# Patient Record
Sex: Male | Born: 1945
Health system: Southern US, Community
[De-identification: ages and names within clinical notes are randomized; demographics above are authoritative.]

## PROBLEM LIST (undated history)

## (undated) DIAGNOSIS — I10 Essential (primary) hypertension: Secondary | ICD-10-CM

## (undated) DIAGNOSIS — Z87442 Personal history of urinary calculi: Secondary | ICD-10-CM

## (undated) DIAGNOSIS — Z860101 Personal history of adenomatous and serrated colon polyps: Secondary | ICD-10-CM

## (undated) DIAGNOSIS — Z87438 Personal history of other diseases of male genital organs: Secondary | ICD-10-CM

## (undated) DIAGNOSIS — G4700 Insomnia, unspecified: Secondary | ICD-10-CM

## (undated) DIAGNOSIS — Z973 Presence of spectacles and contact lenses: Secondary | ICD-10-CM

## (undated) DIAGNOSIS — Z972 Presence of dental prosthetic device (complete) (partial): Secondary | ICD-10-CM

## (undated) DIAGNOSIS — K91858 Other complications of intestinal pouch: Secondary | ICD-10-CM

## (undated) DIAGNOSIS — R399 Unspecified symptoms and signs involving the genitourinary system: Secondary | ICD-10-CM

## (undated) DIAGNOSIS — I82509 Chronic embolism and thrombosis of unspecified deep veins of unspecified lower extremity: Secondary | ICD-10-CM

## (undated) DIAGNOSIS — N471 Phimosis: Secondary | ICD-10-CM

## (undated) DIAGNOSIS — Z86010 Personal history of colonic polyps: Secondary | ICD-10-CM

## (undated) DIAGNOSIS — N182 Chronic kidney disease, stage 2 (mild): Secondary | ICD-10-CM

## (undated) DIAGNOSIS — K449 Diaphragmatic hernia without obstruction or gangrene: Secondary | ICD-10-CM

## (undated) DIAGNOSIS — E119 Type 2 diabetes mellitus without complications: Secondary | ICD-10-CM

## (undated) DIAGNOSIS — M1990 Unspecified osteoarthritis, unspecified site: Secondary | ICD-10-CM

## (undated) DIAGNOSIS — L719 Rosacea, unspecified: Secondary | ICD-10-CM

## (undated) DIAGNOSIS — I2510 Atherosclerotic heart disease of native coronary artery without angina pectoris: Secondary | ICD-10-CM

## (undated) DIAGNOSIS — Z8719 Personal history of other diseases of the digestive system: Secondary | ICD-10-CM

## (undated) DIAGNOSIS — Z8619 Personal history of other infectious and parasitic diseases: Secondary | ICD-10-CM

## (undated) DIAGNOSIS — K219 Gastro-esophageal reflux disease without esophagitis: Secondary | ICD-10-CM

## (undated) DIAGNOSIS — M5030 Other cervical disc degeneration, unspecified cervical region: Secondary | ICD-10-CM

## (undated) DIAGNOSIS — E785 Hyperlipidemia, unspecified: Secondary | ICD-10-CM

## (undated) DIAGNOSIS — E538 Deficiency of other specified B group vitamins: Secondary | ICD-10-CM

## (undated) HISTORY — DX: Insomnia, unspecified: G47.00

## (undated) HISTORY — PX: CARDIAC CATHETERIZATION: SHX172

## (undated) HISTORY — PX: NM MYOCAR PERF WALL MOTION: HXRAD629

## (undated) HISTORY — DX: Atherosclerotic heart disease of native coronary artery without angina pectoris: I25.10

## (undated) HISTORY — PX: KNEE ARTHROSCOPY: SUR90

## (undated) HISTORY — DX: Rosacea, unspecified: L71.9

## (undated) HISTORY — DX: Hyperlipidemia, unspecified: E78.5

## (undated) HISTORY — PX: OTHER SURGICAL HISTORY: SHX169

## (undated) HISTORY — DX: Diaphragmatic hernia without obstruction or gangrene: K44.9

## (undated) HISTORY — DX: Essential (primary) hypertension: I10

---

## 1998-11-15 ENCOUNTER — Encounter: Payer: Self-pay | Admitting: Cardiovascular Disease

## 1998-11-15 ENCOUNTER — Ambulatory Visit (HOSPITAL_COMMUNITY): Admission: RE | Admit: 1998-11-15 | Discharge: 1998-11-15 | Payer: Self-pay | Admitting: Cardiovascular Disease

## 2007-11-16 HISTORY — PX: TRANSTHORACIC ECHOCARDIOGRAM: SHX275

## 2011-07-22 ENCOUNTER — Ambulatory Visit: Payer: Self-pay | Admitting: Rheumatology

## 2011-07-30 ENCOUNTER — Ambulatory Visit: Payer: Self-pay | Admitting: Unknown Physician Specialty

## 2011-08-03 ENCOUNTER — Ambulatory Visit: Payer: Self-pay | Admitting: Unknown Physician Specialty

## 2012-02-19 ENCOUNTER — Encounter: Payer: Self-pay | Admitting: Family Medicine

## 2012-02-19 ENCOUNTER — Ambulatory Visit (INDEPENDENT_AMBULATORY_CARE_PROVIDER_SITE_OTHER): Payer: Medicare Other | Admitting: Family Medicine

## 2012-02-19 DIAGNOSIS — K449 Diaphragmatic hernia without obstruction or gangrene: Secondary | ICD-10-CM

## 2012-02-19 DIAGNOSIS — Z9049 Acquired absence of other specified parts of digestive tract: Secondary | ICD-10-CM

## 2012-02-19 DIAGNOSIS — N200 Calculus of kidney: Secondary | ICD-10-CM

## 2012-02-19 DIAGNOSIS — Z9889 Other specified postprocedural states: Secondary | ICD-10-CM

## 2012-02-19 DIAGNOSIS — E1149 Type 2 diabetes mellitus with other diabetic neurological complication: Secondary | ICD-10-CM

## 2012-02-19 DIAGNOSIS — K5190 Ulcerative colitis, unspecified, without complications: Secondary | ICD-10-CM

## 2012-02-19 DIAGNOSIS — IMO0002 Reserved for concepts with insufficient information to code with codable children: Secondary | ICD-10-CM

## 2012-02-19 DIAGNOSIS — E1142 Type 2 diabetes mellitus with diabetic polyneuropathy: Secondary | ICD-10-CM

## 2012-02-19 DIAGNOSIS — I2510 Atherosclerotic heart disease of native coronary artery without angina pectoris: Secondary | ICD-10-CM

## 2012-02-19 DIAGNOSIS — I10 Essential (primary) hypertension: Secondary | ICD-10-CM

## 2012-02-19 DIAGNOSIS — G4700 Insomnia, unspecified: Secondary | ICD-10-CM

## 2012-02-19 DIAGNOSIS — M1990 Unspecified osteoarthritis, unspecified site: Secondary | ICD-10-CM

## 2012-02-19 DIAGNOSIS — E7800 Pure hypercholesterolemia, unspecified: Secondary | ICD-10-CM

## 2012-02-19 DIAGNOSIS — L719 Rosacea, unspecified: Secondary | ICD-10-CM

## 2012-02-19 NOTE — Patient Instructions (Signed)
Let me look back at your old records and I'll set up labs.  Take care.

## 2012-02-19 NOTE — Progress Notes (Signed)
New patient  Known CAD on medical mgmt per [facility].    Diabetes:  Using medications without difficulties:yes Hypoglycemic episodes:no Hyperglycemic episodes:no Feet problems: see below, h/o tingling and dec in sensation on toes.  Blood Sugars averaging: ~100  Hypertension:    Using medication without problems or lightheadedness: yes Chest pain with exertion:no Edema:no Short of breath:no  Elevated Cholesterol: Using medications without problems:yes Muscle aches: no Diet compliance:yes Exercise:yes  PMH and SH reviewed.   Vital signs, Meds and allergies reviewed.  ROS: See HPI.  Otherwise nontributory.   GEN: nad, alert and oriented HEENT: mucous membranes moist NECK: supple w/o LA CV: rrr.  no murmur PULM: ctab, no inc wob ABD: soft, +bs EXT: no edema SKIN: no acute rash  Diabetic foot exam: Normal inspection No skin breakdown calluses noted Normal DP pulses Dec sensation to light tough and monofilament in the toes Nails normal

## 2012-02-22 ENCOUNTER — Telehealth: Payer: Self-pay | Admitting: Family Medicine

## 2012-02-22 ENCOUNTER — Encounter: Payer: Self-pay | Admitting: Family Medicine

## 2012-02-22 DIAGNOSIS — K5190 Ulcerative colitis, unspecified, without complications: Secondary | ICD-10-CM | POA: Insufficient documentation

## 2012-02-22 DIAGNOSIS — M1990 Unspecified osteoarthritis, unspecified site: Secondary | ICD-10-CM | POA: Insufficient documentation

## 2012-02-22 DIAGNOSIS — E7800 Pure hypercholesterolemia, unspecified: Secondary | ICD-10-CM | POA: Insufficient documentation

## 2012-02-22 DIAGNOSIS — E1149 Type 2 diabetes mellitus with other diabetic neurological complication: Secondary | ICD-10-CM | POA: Insufficient documentation

## 2012-02-22 DIAGNOSIS — IMO0002 Reserved for concepts with insufficient information to code with codable children: Secondary | ICD-10-CM | POA: Insufficient documentation

## 2012-02-22 DIAGNOSIS — Z9049 Acquired absence of other specified parts of digestive tract: Secondary | ICD-10-CM | POA: Insufficient documentation

## 2012-02-22 DIAGNOSIS — K449 Diaphragmatic hernia without obstruction or gangrene: Secondary | ICD-10-CM | POA: Insufficient documentation

## 2012-02-22 DIAGNOSIS — L719 Rosacea, unspecified: Secondary | ICD-10-CM | POA: Insufficient documentation

## 2012-02-22 DIAGNOSIS — N200 Calculus of kidney: Secondary | ICD-10-CM | POA: Insufficient documentation

## 2012-02-22 DIAGNOSIS — I2510 Atherosclerotic heart disease of native coronary artery without angina pectoris: Secondary | ICD-10-CM | POA: Insufficient documentation

## 2012-02-22 DIAGNOSIS — I10 Essential (primary) hypertension: Secondary | ICD-10-CM | POA: Insufficient documentation

## 2012-02-22 DIAGNOSIS — G4700 Insomnia, unspecified: Secondary | ICD-10-CM | POA: Insufficient documentation

## 2012-02-22 NOTE — Assessment & Plan Note (Signed)
Per cards.  Medical mgmt.

## 2012-02-22 NOTE — Assessment & Plan Note (Signed)
Cont current meds.

## 2012-02-22 NOTE — Assessment & Plan Note (Signed)
Cont current meds.  Return for labs.

## 2012-02-22 NOTE — Assessment & Plan Note (Addendum)
Return for labs.  Likely with early stocking and glove sx. If lab w/u neg o/w, then consider trial of gabapentin or lyrica. >45 min spent with face to face with patient, >50% counseling and/or coordinating care.  Old records reviewed.

## 2012-02-22 NOTE — Telephone Encounter (Signed)
Call pt. Have him come in for fasting labs.  We'll address the symptoms in his feet after that.  Thanks.

## 2012-02-22 NOTE — Telephone Encounter (Signed)
Appt scheduled

## 2012-02-22 NOTE — Assessment & Plan Note (Signed)
Controlled, return for labs .

## 2012-02-23 ENCOUNTER — Other Ambulatory Visit (INDEPENDENT_AMBULATORY_CARE_PROVIDER_SITE_OTHER): Payer: Medicare Other

## 2012-02-23 DIAGNOSIS — E1149 Type 2 diabetes mellitus with other diabetic neurological complication: Secondary | ICD-10-CM

## 2012-02-23 DIAGNOSIS — D518 Other vitamin B12 deficiency anemias: Secondary | ICD-10-CM

## 2012-02-23 DIAGNOSIS — E1142 Type 2 diabetes mellitus with diabetic polyneuropathy: Secondary | ICD-10-CM

## 2012-02-23 LAB — COMPREHENSIVE METABOLIC PANEL
ALT: 29 U/L (ref 0–53)
CO2: 30 mEq/L (ref 19–32)
GFR: 52.44 mL/min — ABNORMAL LOW (ref >60.00)
Sodium: 140 mEq/L (ref 135–145)
Total Bilirubin: 0.5 mg/dL (ref 0.3–1.2)
Total Protein: 7.6 g/dL (ref 6.0–8.3)

## 2012-02-23 LAB — LIPID PANEL
HDL: 44.5 mg/dL (ref >39.00)
Total CHOL/HDL Ratio: 3
Triglycerides: 209 mg/dL — ABNORMAL HIGH (ref 0.0–149.0)
VLDL: 41.8 mg/dL — ABNORMAL HIGH (ref 0.0–40.0)

## 2012-02-23 LAB — CBC WITH DIFFERENTIAL/PLATELET
Basophils Absolute: 0 10*3/uL (ref 0.0–0.1)
HCT: 44.1 % (ref 39.0–52.0)
Lymphocytes Relative: 40.5 % (ref 12.0–46.0)
Lymphs Abs: 2.7 10*3/uL (ref 0.7–4.0)
Monocytes Relative: 11.4 % (ref 3.0–12.0)
Neutrophils Relative %: 45.2 % (ref 43.0–77.0)
Platelets: 149 10*3/uL — ABNORMAL LOW (ref 150.0–400.0)
RDW: 15.9 % — ABNORMAL HIGH (ref 11.5–14.6)

## 2012-02-23 LAB — TSH: TSH: 1.39 u[IU]/mL (ref 0.35–5.50)

## 2012-02-24 ENCOUNTER — Other Ambulatory Visit: Payer: Self-pay | Admitting: Family Medicine

## 2012-02-24 DIAGNOSIS — E538 Deficiency of other specified B group vitamins: Secondary | ICD-10-CM | POA: Insufficient documentation

## 2012-02-24 DIAGNOSIS — E119 Type 2 diabetes mellitus without complications: Secondary | ICD-10-CM

## 2012-02-24 MED ORDER — CYANOCOBALAMIN 1000 MCG/ML IJ SOLN: 1000.0000 ug | INTRAMUSCULAR | Status: DC

## 2012-02-25 ENCOUNTER — Other Ambulatory Visit: Payer: Self-pay | Admitting: *Deleted

## 2012-02-25 ENCOUNTER — Ambulatory Visit (INDEPENDENT_AMBULATORY_CARE_PROVIDER_SITE_OTHER): Payer: Medicare Other | Admitting: *Deleted

## 2012-02-25 DIAGNOSIS — E538 Deficiency of other specified B group vitamins: Secondary | ICD-10-CM

## 2012-02-25 MED ORDER — GLIPIZIDE ER 10 MG PO TB24: 10.0000 mg | ORAL_TABLET | Freq: Every day | ORAL | Status: DC

## 2012-02-25 MED ORDER — CYANOCOBALAMIN 1000 MCG/ML IJ SOLN
1000.0000 ug | Freq: Once | INTRAMUSCULAR | Status: AC
  Administered 2012-02-25: 1000 ug via INTRAMUSCULAR

## 2012-03-11 ENCOUNTER — Other Ambulatory Visit: Payer: Self-pay | Admitting: *Deleted

## 2012-03-11 NOTE — Telephone Encounter (Signed)
Faxed refill request   

## 2012-03-13 MED ORDER — ZOLPIDEM TARTRATE 10 MG PO TABS: 10.0000 mg | ORAL_TABLET | Freq: Every evening | ORAL | Status: DC | PRN

## 2012-03-13 NOTE — Telephone Encounter (Signed)
Please call in

## 2012-03-14 NOTE — Telephone Encounter (Signed)
Medication phoned to pharmacy.  

## 2012-03-24 ENCOUNTER — Ambulatory Visit (INDEPENDENT_AMBULATORY_CARE_PROVIDER_SITE_OTHER): Payer: Medicare Other | Admitting: *Deleted

## 2012-03-24 ENCOUNTER — Telehealth: Payer: Self-pay | Admitting: *Deleted

## 2012-03-24 DIAGNOSIS — E538 Deficiency of other specified B group vitamins: Secondary | ICD-10-CM

## 2012-03-24 MED ORDER — CYANOCOBALAMIN 1000 MCG/ML IJ SOLN
1000.0000 ug | Freq: Once | INTRAMUSCULAR | Status: AC
  Administered 2012-03-24: 1000 ug via INTRAMUSCULAR

## 2012-03-24 NOTE — Telephone Encounter (Signed)
Pt came in today for b-12 injections and was asking if he could start doing them himself, he's familiar with how to do them, his brother and son give their own b-12 injections. Pt uses [facility]

## 2012-03-27 NOTE — Telephone Encounter (Signed)
Please call into pharmacy rx for B12 1070mg IM q30days.  If it comes in 1 mL vials, that is fine with me (ie 12 vials).  Call in 12 of the 148msyringes with 12 needles.  Thanks.

## 2012-03-28 ENCOUNTER — Other Ambulatory Visit: Payer: Self-pay | Admitting: *Deleted

## 2012-03-29 ENCOUNTER — Other Ambulatory Visit: Payer: Self-pay | Admitting: *Deleted

## 2012-03-29 MED ORDER — CYANOCOBALAMIN 1000 MCG/ML IJ SOLN: 1000.0000 ug | INTRAMUSCULAR | Status: DC

## 2012-03-29 MED ORDER — "NEEDLE (DISP) 25G X 5/8"" MISC": 25.0000 g | Status: DC

## 2012-03-29 MED ORDER — SYRINGE (DISPOSABLE) 1 ML MISC: 1.0000 mL | Status: DC

## 2012-03-31 ENCOUNTER — Other Ambulatory Visit: Payer: Self-pay | Admitting: *Deleted

## 2012-03-31 MED ORDER — METFORMIN HCL 1000 MG PO TABS: 1000.0000 mg | ORAL_TABLET | Freq: Two times a day (BID) | ORAL | Status: DC

## 2012-04-11 ENCOUNTER — Other Ambulatory Visit: Payer: Self-pay | Admitting: Family Medicine

## 2012-08-24 ENCOUNTER — Other Ambulatory Visit (INDEPENDENT_AMBULATORY_CARE_PROVIDER_SITE_OTHER): Payer: Medicare Other

## 2012-08-24 DIAGNOSIS — E119 Type 2 diabetes mellitus without complications: Secondary | ICD-10-CM

## 2012-08-24 DIAGNOSIS — E538 Deficiency of other specified B group vitamins: Secondary | ICD-10-CM

## 2012-08-24 LAB — VITAMIN B12: Vitamin B-12: 248 pg/mL (ref 211–911)

## 2012-09-13 ENCOUNTER — Other Ambulatory Visit: Payer: Self-pay

## 2012-09-13 NOTE — Telephone Encounter (Signed)
[facility] faxed refill Zolpidem. Last filled 08/08/12.Please advise.

## 2012-09-14 MED ORDER — ZOLPIDEM TARTRATE 10 MG PO TABS: 10.0000 mg | ORAL_TABLET | Freq: Every evening | ORAL | Status: DC | PRN

## 2012-09-14 NOTE — Telephone Encounter (Signed)
Rx called to pharmacy as instructed. 

## 2012-09-14 NOTE — Telephone Encounter (Signed)
Please call in

## 2012-09-15 ENCOUNTER — Encounter: Payer: Self-pay | Admitting: Family Medicine

## 2012-09-15 ENCOUNTER — Other Ambulatory Visit: Payer: Self-pay | Admitting: *Deleted

## 2012-09-15 ENCOUNTER — Ambulatory Visit (INDEPENDENT_AMBULATORY_CARE_PROVIDER_SITE_OTHER): Payer: Medicare Other | Admitting: Family Medicine

## 2012-09-15 VITALS — BP 120/84 | HR 80 | Temp 98.5°F | Wt 208.8 lb

## 2012-09-15 DIAGNOSIS — E538 Deficiency of other specified B group vitamins: Secondary | ICD-10-CM

## 2012-09-15 DIAGNOSIS — G4700 Insomnia, unspecified: Secondary | ICD-10-CM

## 2012-09-15 DIAGNOSIS — Z125 Encounter for screening for malignant neoplasm of prostate: Secondary | ICD-10-CM

## 2012-09-15 DIAGNOSIS — E1142 Type 2 diabetes mellitus with diabetic polyneuropathy: Secondary | ICD-10-CM

## 2012-09-15 DIAGNOSIS — E1149 Type 2 diabetes mellitus with other diabetic neurological complication: Secondary | ICD-10-CM

## 2012-09-15 DIAGNOSIS — E119 Type 2 diabetes mellitus without complications: Secondary | ICD-10-CM

## 2012-09-15 MED ORDER — TRAMADOL HCL ER 200 MG PO TB24: 200.0000 mg | ORAL_TABLET | Freq: Every day | ORAL | Status: DC

## 2012-09-15 NOTE — Progress Notes (Signed)
Insomnia.  Treated and controlled with ambien.  He'll have nightmares if he doesn't have good sleep.  This is controlled too with ambien.    Tingling is better in hands and feet, mouth.  Tingling in mouth noted to have started only after oral surgery.  All the sx are best in AM, more noticeable in the PM.  Overall improved on B12 treatment but A1c is also improved.    Diabetes:  Using medications without difficulties:yes Hypoglycemic episodes:no Hyperglycemic episodes:no Feet problems: as above Sugar averaging ~110 A1c 6.2.   PMH and SH reviewed  Meds, vitals, and allergies reviewed.   ROS: See HPI.  Otherwise negative.    GEN: nad, alert and oriented HEENT: mucous membranes moist NECK: supple w/o LA CV: rrr. PULM: ctab, no inc wob ABD: soft, +bs EXT: no edema SKIN: no acute rash  Diabetic foot exam: Normal inspection No skin breakdown No calluses  Normal DP pulses Dec sensation to light touch and monofilament on distal portion of toes B Nails normal

## 2012-09-15 NOTE — Patient Instructions (Addendum)
Check with your insurance to see if they will cover the flu, shingles and pneumonia shot. Recheck labs in 6 months before a physical.   Take care. Glad to see you.

## 2012-09-16 NOTE — Assessment & Plan Note (Signed)
Controlled, continue current meds, recheck in 6 months.

## 2012-09-16 NOTE — Assessment & Plan Note (Signed)
Improved, unclear if improvement is due to B12 tx or improved A1c.  Okay for outpatient f/u, recheck 6 months.  He agrees.

## 2012-09-16 NOTE — Assessment & Plan Note (Signed)
Controlled, continue current meds.  No ADE and doing well.

## 2012-12-20 ENCOUNTER — Encounter: Payer: Self-pay | Admitting: Family Medicine

## 2013-02-27 ENCOUNTER — Other Ambulatory Visit (INDEPENDENT_AMBULATORY_CARE_PROVIDER_SITE_OTHER): Payer: Medicare Other

## 2013-02-27 DIAGNOSIS — Z125 Encounter for screening for malignant neoplasm of prostate: Secondary | ICD-10-CM

## 2013-02-27 DIAGNOSIS — E119 Type 2 diabetes mellitus without complications: Secondary | ICD-10-CM

## 2013-02-27 DIAGNOSIS — E538 Deficiency of other specified B group vitamins: Secondary | ICD-10-CM

## 2013-02-27 LAB — LIPID PANEL
Cholesterol: 143 mg/dL (ref 0–200)
Total CHOL/HDL Ratio: 4
VLDL: 45.4 mg/dL — ABNORMAL HIGH (ref 0.0–40.0)

## 2013-02-27 LAB — VITAMIN B12: Vitamin B-12: 805 pg/mL (ref 211–911)

## 2013-02-27 LAB — COMPREHENSIVE METABOLIC PANEL
ALT: 26 U/L (ref 0–53)
AST: 34 U/L (ref 0–37)
Chloride: 103 mEq/L (ref 96–112)
Creatinine, Ser: 1.3 mg/dL (ref 0.4–1.5)
Total Bilirubin: 0.6 mg/dL (ref 0.3–1.2)

## 2013-02-27 LAB — LDL CHOLESTEROL, DIRECT: Direct LDL: 55.5 mg/dL

## 2013-03-03 ENCOUNTER — Encounter: Payer: Medicare Other | Admitting: Family Medicine

## 2013-03-07 ENCOUNTER — Ambulatory Visit (INDEPENDENT_AMBULATORY_CARE_PROVIDER_SITE_OTHER): Payer: Medicare Other | Admitting: Family Medicine

## 2013-03-07 ENCOUNTER — Encounter: Payer: Self-pay | Admitting: Family Medicine

## 2013-03-07 VITALS — BP 118/76 | HR 71 | Temp 98.7°F | Ht 66.0 in | Wt 217.0 lb

## 2013-03-07 DIAGNOSIS — G4700 Insomnia, unspecified: Secondary | ICD-10-CM

## 2013-03-07 DIAGNOSIS — Z23 Encounter for immunization: Secondary | ICD-10-CM

## 2013-03-07 DIAGNOSIS — Z0000 Encounter for general adult medical examination without abnormal findings: Secondary | ICD-10-CM | POA: Insufficient documentation

## 2013-03-07 DIAGNOSIS — I10 Essential (primary) hypertension: Secondary | ICD-10-CM

## 2013-03-07 DIAGNOSIS — E119 Type 2 diabetes mellitus without complications: Secondary | ICD-10-CM

## 2013-03-07 DIAGNOSIS — E538 Deficiency of other specified B group vitamins: Secondary | ICD-10-CM

## 2013-03-07 DIAGNOSIS — E1142 Type 2 diabetes mellitus with diabetic polyneuropathy: Secondary | ICD-10-CM

## 2013-03-07 DIAGNOSIS — E1149 Type 2 diabetes mellitus with other diabetic neurological complication: Secondary | ICD-10-CM

## 2013-03-07 MED ORDER — ASPIRIN 81 MG PO TABS: 81.0000 mg | ORAL_TABLET | Freq: Every day | ORAL | Status: DC

## 2013-03-07 NOTE — Assessment & Plan Note (Signed)
Stop for now, recheck level in 6 months.  He agrees.

## 2013-03-07 NOTE — Assessment & Plan Note (Signed)
Advised pt to add on ASA 72m a day.

## 2013-03-07 NOTE — Assessment & Plan Note (Signed)
Controlled with ambien per patient report w/o ADE.  Continue as is.

## 2013-03-07 NOTE — Assessment & Plan Note (Signed)
See scanned forms. Routine anticipatory guidance given to patient. See health maintenance.  Flu encouraged yearly  Shingles d/w pt. See instructions.  PNA deferred today  Tetanus done today.  Colon CA screening- s/p colectomy and he'll call [facility] about follow up.  Prostate cancer screening- PSA wnl.  Advance directive. D/w pt. If incapacitated, he'd want his wife to speak for him. If she couldn't, then he would want his brother [employer].  Cognitive function addressed- see scanned forms- and if abnormal then additional documentation follows.

## 2013-03-07 NOTE — Assessment & Plan Note (Signed)
Controlled, continue as is.  D/w pt about diet and weight. He's working on both.

## 2013-03-07 NOTE — Progress Notes (Signed)
I have personally reviewed the Medicare Annual Wellness questionnaire and have noted 1. The patient's medical and social history 2. Their use of alcohol, tobacco or illicit drugs 3. Their current medications and supplements 4. The patient's functional ability including ADL's, fall risks, home safety risks and hearing or visual             impairment. 5. Diet and physical activities 6. Evidence for depression or mood disorders  The patients weight, height, BMI have been recorded in the chart and visual acuity is per eye clinic.  I have made referrals, counseling and provided education to the patient based review of the above and I have provided the pt with a written personalized care plan for preventive services.  See scanned forms.  Routine anticipatory guidance given to patient.  See health maintenance. Flu encouraged yearly Shingles d/w pt.  See instructions.   PNA deferred today Tetanus done today.  Colon CA screening- s/p colectomy and he'll call [facility] about follow up.  Prostate cancer screening- PSA wnl.   Advance directive.  D/w pt.  If incapacitated, he'd want his wife to speak for him.  If she couldn't, then he would want his brother [employer].   Cognitive function addressed- see scanned forms- and if abnormal then additional documentation follows.   Diabetes:  Using medications without difficulties:yes Hypoglycemic episodes: rare and mild, only if prolonged fasting Hyperglycemic episodes:no Feet problems:no eye exam within last year:yes  Hypertension:    Using medication without problems or lightheadedness: yes Chest pain with exertion:no Edema:no Short of breath:no  B12 def- repleted now.  Labs d/w pt.  We talked about stopping for now and recheck in 6 months.  He agrees.   He is concerned about his wife's memory.  I asked him to talk with her about coming back for a follow up check.  He agreed.  PMH and SH reviewed.   Vital signs, Meds and allergies  reviewed.  ROS: See HPI.  Otherwise nontributory.   GEN: nad, alert and oriented HEENT: mucous membranes moist NECK: supple w/o LA CV: rrr PULM: ctab, no inc wob ABD: soft, +bs EXT: no edema SKIN: no acute rash  Diabetic foot exam: Normal inspection No skin breakdown No calluses  Normal DP pulses Normal sensation to light tough and monofilament Nails normal

## 2013-03-07 NOTE — Patient Instructions (Addendum)
Call about follow up at [place] at the GI clinic.   Check with your insurance to see if they will cover the shingles shot. I would get a flu shot each fall.   We can get you a pneumonia shot at some point in the future.   We can scan a copy of your living will.   I would start taking 53m of aspirin a day with food.   Stop the B12 for now.  Recheck labs in 6 months before a visit.  Take care.  Glad to see you.

## 2013-03-07 NOTE — Assessment & Plan Note (Signed)
Controlled, continue as is.  Tolerating current meds.

## 2013-03-15 ENCOUNTER — Encounter: Payer: Self-pay | Admitting: Family Medicine

## 2013-03-15 DIAGNOSIS — Z789 Other specified health status: Secondary | ICD-10-CM | POA: Insufficient documentation

## 2013-03-21 ENCOUNTER — Other Ambulatory Visit: Payer: Self-pay | Admitting: *Deleted

## 2013-03-21 MED ORDER — GLIPIZIDE ER 10 MG PO TB24: 10.0000 mg | ORAL_TABLET | Freq: Every day | ORAL | Status: DC

## 2013-03-22 ENCOUNTER — Other Ambulatory Visit: Payer: Self-pay | Admitting: *Deleted

## 2013-03-22 MED ORDER — ZOLPIDEM TARTRATE 10 MG PO TABS: 10.0000 mg | ORAL_TABLET | Freq: Every evening | ORAL | Status: DC | PRN

## 2013-03-22 NOTE — Telephone Encounter (Signed)
Please call in

## 2013-03-22 NOTE — Telephone Encounter (Signed)
Ok to refill 

## 2013-03-23 ENCOUNTER — Other Ambulatory Visit: Payer: Self-pay | Admitting: *Deleted

## 2013-03-23 NOTE — Telephone Encounter (Signed)
Rx phoned to pharmacy.  

## 2013-04-28 ENCOUNTER — Other Ambulatory Visit: Payer: Self-pay | Admitting: *Deleted

## 2013-04-28 MED ORDER — METFORMIN HCL 1000 MG PO TABS: 1000.0000 mg | ORAL_TABLET | Freq: Two times a day (BID) | ORAL | Status: DC

## 2013-07-06 ENCOUNTER — Other Ambulatory Visit: Payer: Self-pay

## 2013-07-07 ENCOUNTER — Other Ambulatory Visit: Payer: Self-pay | Admitting: *Deleted

## 2013-07-07 MED ORDER — FENOFIBRATE MICRONIZED 134 MG PO CAPS: 134.0000 mg | ORAL_CAPSULE | Freq: Every evening | ORAL | Status: DC

## 2013-07-07 NOTE — Telephone Encounter (Signed)
Rx was sent to pharmacy electronically. 

## 2013-09-01 ENCOUNTER — Encounter: Payer: Self-pay | Admitting: Radiology

## 2013-09-04 ENCOUNTER — Other Ambulatory Visit (INDEPENDENT_AMBULATORY_CARE_PROVIDER_SITE_OTHER): Payer: Medicare Other

## 2013-09-04 ENCOUNTER — Other Ambulatory Visit: Payer: Medicare Other | Admitting: Family Medicine

## 2013-09-04 DIAGNOSIS — E538 Deficiency of other specified B group vitamins: Secondary | ICD-10-CM

## 2013-09-04 DIAGNOSIS — E119 Type 2 diabetes mellitus without complications: Secondary | ICD-10-CM

## 2013-09-07 ENCOUNTER — Ambulatory Visit (INDEPENDENT_AMBULATORY_CARE_PROVIDER_SITE_OTHER): Payer: Commercial Managed Care - HMO | Admitting: Family Medicine

## 2013-09-07 ENCOUNTER — Encounter: Payer: Self-pay | Admitting: Family Medicine

## 2013-09-07 VITALS — BP 114/80 | HR 68 | Temp 98.1°F | Wt 207.5 lb

## 2013-09-07 DIAGNOSIS — E1149 Type 2 diabetes mellitus with other diabetic neurological complication: Secondary | ICD-10-CM

## 2013-09-07 DIAGNOSIS — E538 Deficiency of other specified B group vitamins: Secondary | ICD-10-CM

## 2013-09-07 DIAGNOSIS — Z23 Encounter for immunization: Secondary | ICD-10-CM

## 2013-09-07 DIAGNOSIS — E1142 Type 2 diabetes mellitus with diabetic polyneuropathy: Secondary | ICD-10-CM

## 2013-09-07 MED ORDER — CYANOCOBALAMIN 1000 MCG/ML IJ SOLN: 1000.0000 ug | INTRAMUSCULAR | Status: DC

## 2013-09-07 NOTE — Progress Notes (Signed)
Tingling in hands feet and chin. B12 was low.  He didn't know if his sx could be related to the doxy.  He has sun sensitivity with that.  The tramadol helps the hand sx.  He can give the B12 shots at home.  He has syringes but not B12 itself at home. The more active he is, the more pain in tingling he has in his feet.  This is long standing.     Diabetes:  Using medications without difficulties:yes Hypoglycemic episodes:only if prolonged fasting Hyperglycemic episodes:no Feet problems: see above Blood Sugars averaging: ~115-140 eye exam within last year:yes  Meds, vitals, and allergies reviewed.   ROS: See HPI.  Otherwise negative.    GEN: nad, alert and oriented HEENT: mucous membranes moist NECK: supple w/o LA CV: rrr. PULM: ctab, no inc wob ABD: soft, +bs EXT: no edema SKIN: no acute rash  Diabetic foot exam: Normal inspection No skin breakdown No calluses  Normal DP pulses Normal sensation to light touch and monofilament except for dec to monofilament at the distal MT heads on plantar side of feet Nails normal

## 2013-09-07 NOTE — Patient Instructions (Signed)
Recheck labs before a physical in about 6 months.   Start back on the B12.  If you don't improve, then let me know.  Take care.   Glad to see you.

## 2013-09-08 NOTE — Assessment & Plan Note (Signed)
Restart tx and recheck in about 6 months at [facility].

## 2013-09-08 NOTE — Assessment & Plan Note (Signed)
With longstanding but not progressive neuro changes. Restart B12 and he'll notify me if the neuro sx worsen, don't improve.  He agrees.  A1c controlled, continue as is.  He agrees.  Labs d/w pt.

## 2013-09-25 ENCOUNTER — Encounter: Payer: Self-pay | Admitting: Family Medicine

## 2013-09-25 ENCOUNTER — Other Ambulatory Visit: Payer: Self-pay | Admitting: Family Medicine

## 2013-09-25 NOTE — Telephone Encounter (Signed)
Electronic refill request. Please advise.

## 2013-09-25 NOTE — Telephone Encounter (Signed)
Please call in.  Thanks.   

## 2013-09-26 NOTE — Telephone Encounter (Signed)
Medication phoned to pharmacy.  

## 2013-10-04 ENCOUNTER — Encounter: Payer: Self-pay | Admitting: Cardiovascular Disease

## 2013-10-04 ENCOUNTER — Ambulatory Visit (INDEPENDENT_AMBULATORY_CARE_PROVIDER_SITE_OTHER): Payer: Medicare Other | Admitting: Cardiovascular Disease

## 2013-10-04 VITALS — BP 116/72 | HR 58 | Ht 68.0 in | Wt 206.5 lb

## 2013-10-04 DIAGNOSIS — E7800 Pure hypercholesterolemia, unspecified: Secondary | ICD-10-CM

## 2013-10-04 DIAGNOSIS — I10 Essential (primary) hypertension: Secondary | ICD-10-CM

## 2013-10-04 DIAGNOSIS — I2510 Atherosclerotic heart disease of native coronary artery without angina pectoris: Secondary | ICD-10-CM

## 2013-10-04 NOTE — Patient Instructions (Signed)
Your physician recommends that you schedule a follow-up appointment in: 1 year  

## 2013-10-04 NOTE — Assessment & Plan Note (Signed)
On statin therapy followed by his PCP. His last cholesterol performed 02/27/13 revealed a total cholesterol of 143, HDL of 38 a triglyceride level of 227. His LDL was not documented

## 2013-10-04 NOTE — Assessment & Plan Note (Signed)
Status post cardiac catheterization performed myself 11/15/98 revealing three-vessel disease with preserved left jugular function. He had a 30% distal left main, 75% mid LAD, 50% OM branch stenosis and a total RCA with left to right collaterals with normal LV function. He had a nonischemic Myoview 3 years ago and denies chest pain or shortness of breath. We will continue medical therapy.

## 2013-10-04 NOTE — Assessment & Plan Note (Signed)
Well-controlled on current medications 

## 2013-10-04 NOTE — Progress Notes (Signed)
10/04/2013 [facility]   05/27/1946  [id number]  Primary Physician [name], MD Primary Cardiologist: [name] MD Renae Gloss   HPI:  The patient is a very pleasant 67 year old mildly to moderately overweight married Caucasian male father of 74, grandfather to 3 grandchildren who I saw a year ago. He has a history of ischemic heart disease status post cardiac catheterization by myself November 15, 1998 revealing 3-vessel disease with preserved LV function. Medical therapy was recommended at that time. He had a 30% distal left main, a 75% mid LAD, a 50% OM and a total RCA with left-to-right collaterals and normal LV function. His other problems include hypertension and hyperlipidemia. He is totally asymptomatic. His last Myoview performed August 06, 2010 was nonischemic, and recent lab work performed 02/27/13 revealed a total cholesterol 143, HDL 37 and her level of 227.[name] Since I saw him a year ago he denies chest pain or shortness of breath.     Current Outpatient Prescriptions  Medication Sig Dispense Refill  . aspirin 81 MG tablet Take 1 tablet (81 mg total) by mouth daily.      . cyanocobalamin (,VITAMIN B-12,) 1000 MCG/ML injection Inject 1 mL (1,000 mcg total) into the muscle every 30 (thirty) days.  12 mL  1  . doxycycline (VIBRAMYCIN) 100 MG capsule Take 100 mg by mouth every morning.      . fenofibrate micronized (LOFIBRA) 134 MG capsule Take 1 capsule (134 mg total) by mouth Nightly.  30 capsule  6  . glipiZIDE (GLUCOTROL XL) 10 MG 24 hr tablet Take 1 tablet (10 mg total) by mouth daily.  30 tablet  11  . lisinopril-hydrochlorothiazide (PRINZIDE,ZESTORETIC) 20-12.5 MG per tablet Take 2 tablets by mouth daily.      [name] lovastatin (MEVACOR) 40 MG tablet Take 40 mg by mouth at bedtime.      . metFORMIN (GLUCOPHAGE) 1000 MG tablet Take 1 tablet (1,000 mg total) by mouth 2 (two) times daily with a meal.  60 tablet  6  . metoprolol succinate (TOPROL-XL) 50 MG  24 hr tablet Take 50 mg by mouth daily.       [name] NEEDLE, DISP, 25 G (BD DISP NEEDLES) 25G X 5/8" MISC 25 g by Does not apply route every 30 (thirty) days.  12 each  0  . NON FORMULARY Nattokinase Complex one tab daily      . NON FORMULARY Astaxanthin  One 12 mg tablet daily for skin      . NON FORMULARY Diabetes Multi Vitamin and Minerals one tablet daily      . Syringe, Disposable, 1 ML MISC 1 mL by Does not apply route every 30 (thirty) days.  12 each  0  . traMADol (ULTRAM) 50 MG tablet Take 2 tablets by mouth three times daily.      [name] zolpidem (AMBIEN) 10 MG tablet TAKE 1 TABLET BY MOUTH EVERY NIGHT AT BEDTIME AS NEEDED  30 tablet  5   No current facility-administered medications for this visit.    No Known Allergies  History   Social History  . Marital Status: Married    Spouse Name: N/A    Number of Children: N/A  . Years of Education: N/A   Occupational History  . Not on file.   Social History Main Topics  . Smoking status: Former Smoker -- 2.00 packs/day for 10 years    Types: Cigarettes    Quit date: 12/28/1974  . Smokeless tobacco: Never  Used  . Alcohol Use: Yes     Comment: socially - beer  . Drug Use: No  . Sexual Activity: Not on file   Other Topics Concern  . Not on file   Social History Narrative   Married 1967   Retired from [employer] work     Review of Systems: General: negative for chills, fever, night sweats or weight changes.  Cardiovascular: negative for chest pain, dyspnea on exertion, edema, orthopnea, palpitations, paroxysmal nocturnal dyspnea or shortness of breath Dermatological: negative for rash Respiratory: negative for cough or wheezing Urologic: negative for hematuria Abdominal: negative for nausea, vomiting, diarrhea, bright red blood per rectum, melena, or hematemesis Neurologic: negative for visual changes, syncope, or dizziness All other systems reviewed and are otherwise negative except as noted above.    Blood pressure 116/72,  pulse 58, height 5' 8"  (1.727 m), weight 206 lb 8 oz (93.668 kg).  General appearance: alert and no distress Neck: no adenopathy, no carotid bruit, no JVD, supple, symmetrical, trachea midline and thyroid not enlarged, symmetric, no tenderness/mass/nodules Lungs: clear to auscultation bilaterally Heart: regular rate and rhythm, S1, S2 normal, no murmur, click, rub or gallop Extremities: extremities normal, atraumatic, no cyanosis or edema  EKG sinus bradycardia at 58 without ST or T wave changes  ASSESSMENT AND PLAN:   CAD (coronary artery disease) Status post cardiac catheterization performed myself 11/15/98 revealing three-vessel disease with preserved left jugular function. He had a 30% distal left main, 75% mid LAD, 50% OM branch stenosis and a total RCA with left to right collaterals with normal LV function. He had a nonischemic Myoview 3 years ago and denies chest pain or shortness of breath. We will continue medical therapy.  Essential hypertension, benign Well-controlled on current medications  Pure hypercholesterolemia On statin therapy followed by his PCP. His last cholesterol performed 02/27/13 revealed a total cholesterol of 143, HDL of 38 a triglyceride level of 227. His LDL was not documented      [name] MD [facility], [facility] 10/04/2013 1:54 PM

## 2013-10-05 ENCOUNTER — Encounter: Payer: Self-pay | Admitting: Cardiovascular Disease

## 2013-10-12 ENCOUNTER — Ambulatory Visit: Payer: Medicare Other | Admitting: Cardiovascular Disease

## 2013-10-17 ENCOUNTER — Ambulatory Visit (INDEPENDENT_AMBULATORY_CARE_PROVIDER_SITE_OTHER): Payer: Medicare Other

## 2013-10-17 DIAGNOSIS — Z23 Encounter for immunization: Secondary | ICD-10-CM

## 2013-10-17 DIAGNOSIS — Z2911 Encounter for prophylactic immunotherapy for respiratory syncytial virus (RSV): Secondary | ICD-10-CM

## 2013-10-17 NOTE — Progress Notes (Signed)
  Subjective:    Patient ID: [facility], male    DOB: May 22, 1946, 67 y.o.   MRN: [id number]  HPI    Review of Systems     Objective:   Physical Exam        Assessment & Plan:  Pt did not want to take shingles and pneumovax together if may decrease the shingles vaccine effectiveness and pt's wife is going to return later for pneumovax also.

## 2013-11-21 ENCOUNTER — Ambulatory Visit (INDEPENDENT_AMBULATORY_CARE_PROVIDER_SITE_OTHER): Payer: Medicare Other

## 2013-11-21 DIAGNOSIS — Z23 Encounter for immunization: Secondary | ICD-10-CM

## 2013-11-30 LAB — HM DIABETES EYE EXAM

## 2013-12-05 ENCOUNTER — Other Ambulatory Visit: Payer: Self-pay | Admitting: *Deleted

## 2013-12-05 MED ORDER — LOVASTATIN 40 MG PO TABS: 40.0000 mg | ORAL_TABLET | Freq: Every day | ORAL | Status: DC

## 2013-12-05 NOTE — Telephone Encounter (Signed)
Rx was sent to pharmacy electronically. 

## 2013-12-13 ENCOUNTER — Other Ambulatory Visit: Payer: Self-pay

## 2013-12-13 MED ORDER — LISINOPRIL-HYDROCHLOROTHIAZIDE 20-12.5 MG PO TABS: 2.0000 | ORAL_TABLET | Freq: Every day | ORAL | Status: DC

## 2013-12-13 NOTE — Telephone Encounter (Signed)
Rx was sent to pharmacy electronically. 

## 2014-01-16 ENCOUNTER — Encounter: Payer: Self-pay | Admitting: Family Medicine

## 2014-01-23 ENCOUNTER — Other Ambulatory Visit: Payer: Self-pay | Admitting: Family Medicine

## 2014-03-06 NOTE — Telephone Encounter (Deleted)
Rx was sent to pharmacy electronically. 

## 2014-03-07 ENCOUNTER — Other Ambulatory Visit: Payer: Self-pay | Admitting: *Deleted

## 2014-03-07 ENCOUNTER — Other Ambulatory Visit: Payer: Self-pay | Admitting: Family Medicine

## 2014-03-07 DIAGNOSIS — E538 Deficiency of other specified B group vitamins: Secondary | ICD-10-CM

## 2014-03-07 DIAGNOSIS — E1149 Type 2 diabetes mellitus with other diabetic neurological complication: Secondary | ICD-10-CM

## 2014-03-07 MED ORDER — NITROGLYCERIN 0.4 MG SL SUBL: 0.4000 mg | SUBLINGUAL_TABLET | SUBLINGUAL | Status: DC | PRN

## 2014-03-07 NOTE — Telephone Encounter (Signed)
Entered in error

## 2014-03-08 ENCOUNTER — Other Ambulatory Visit (INDEPENDENT_AMBULATORY_CARE_PROVIDER_SITE_OTHER): Payer: Medicare HMO

## 2014-03-08 ENCOUNTER — Encounter: Payer: Self-pay | Admitting: Cardiovascular Disease

## 2014-03-08 ENCOUNTER — Ambulatory Visit (INDEPENDENT_AMBULATORY_CARE_PROVIDER_SITE_OTHER): Payer: Commercial Managed Care - HMO | Admitting: Cardiovascular Disease

## 2014-03-08 VITALS — BP 120/72 | HR 72 | Ht 65.0 in | Wt 214.0 lb

## 2014-03-08 DIAGNOSIS — E7800 Pure hypercholesterolemia, unspecified: Secondary | ICD-10-CM

## 2014-03-08 DIAGNOSIS — R5383 Other fatigue: Secondary | ICD-10-CM

## 2014-03-08 DIAGNOSIS — R079 Chest pain, unspecified: Secondary | ICD-10-CM

## 2014-03-08 DIAGNOSIS — I10 Essential (primary) hypertension: Secondary | ICD-10-CM

## 2014-03-08 DIAGNOSIS — E538 Deficiency of other specified B group vitamins: Secondary | ICD-10-CM

## 2014-03-08 DIAGNOSIS — Z79899 Other long term (current) drug therapy: Secondary | ICD-10-CM

## 2014-03-08 DIAGNOSIS — E1149 Type 2 diabetes mellitus with other diabetic neurological complication: Secondary | ICD-10-CM

## 2014-03-08 DIAGNOSIS — D689 Coagulation defect, unspecified: Secondary | ICD-10-CM

## 2014-03-08 DIAGNOSIS — I2510 Atherosclerotic heart disease of native coronary artery without angina pectoris: Secondary | ICD-10-CM

## 2014-03-08 DIAGNOSIS — R5381 Other malaise: Secondary | ICD-10-CM

## 2014-03-08 DIAGNOSIS — Z01818 Encounter for other preprocedural examination: Secondary | ICD-10-CM

## 2014-03-08 LAB — COMPREHENSIVE METABOLIC PANEL
ALK PHOS: 42 U/L (ref 39–117)
ALT: 20 U/L (ref 0–53)
AST: 27 U/L (ref 0–37)
Albumin: 3.9 g/dL (ref 3.5–5.2)
BILIRUBIN TOTAL: 0.5 mg/dL (ref 0.3–1.2)
BUN: 15 mg/dL (ref 6–23)
CO2: 29 mEq/L (ref 19–32)
Calcium: 9.4 mg/dL (ref 8.4–10.5)
Chloride: 104 mEq/L (ref 96–112)
Creatinine, Ser: 1.1 mg/dL (ref 0.4–1.5)
GFR: 69.1 mL/min (ref >60.00)
GLUCOSE: 131 mg/dL — AB (ref 70–99)
Potassium: 3.9 mEq/L (ref 3.5–5.1)
Sodium: 140 mEq/L (ref 135–145)
Total Protein: 7.3 g/dL (ref 6.0–8.3)

## 2014-03-08 LAB — LIPID PANEL
CHOLESTEROL: 98 mg/dL (ref 0–200)
HDL: 41.3 mg/dL (ref >39.00)
LDL CALC: 31 mg/dL (ref 0–99)
TRIGLYCERIDES: 129 mg/dL (ref 0.0–149.0)
Total CHOL/HDL Ratio: 2
VLDL: 25.8 mg/dL (ref 0.0–40.0)

## 2014-03-08 LAB — HEMOGLOBIN A1C: Hgb A1c MFr Bld: 7.4 % — ABNORMAL HIGH (ref 4.6–6.5)

## 2014-03-08 LAB — VITAMIN B12: Vitamin B-12: 800 pg/mL (ref 211–911)

## 2014-03-08 NOTE — Assessment & Plan Note (Signed)
Controlled on current medications 

## 2014-03-08 NOTE — Assessment & Plan Note (Signed)
Patient has a history of CAD status post cardiac catheterization by myself 11/15/98 revealing 30% distal left main, 75% mid LAD, 50% obtuse marginal branch and a total RCA with left to right collaterals. Atheromas he's had progressive to exertion and substernal chest pain. Based on the progression of his symptoms and his known CAD I'm going to proceed with outpatient cardiac catheterization via the right radial approach. I suspect he'll need coronary bypass grafting. I've explained the risks and benefits. The patient is a candidate for a drug-eluting stent.

## 2014-03-08 NOTE — Assessment & Plan Note (Signed)
On statin therapy followed by his PCP. His most recent lipid profile performed 03/08/14 revealed a glucose of 98, LDL of 31 and HDL 41.

## 2014-03-08 NOTE — Patient Instructions (Signed)
Your physician has requested that you have a cardiac catheterization radially on Monday, 03/12/14. Cardiac catheterization is used to diagnose and/or treat various heart conditions. Doctors may recommend this procedure for a number of different reasons. The most common reason is to evaluate chest pain. Chest pain can be a symptom of coronary artery disease (CAD), and cardiac catheterization can show whether plaque is narrowing or blocking your heart's arteries. This procedure is also used to evaluate the valves, as well as measure the blood flow and oxygen levels in different parts of your heart. For further information please visit [email or web address].   Following your catheterization, you will not be allowed to drive for 3 days.  No lifting, pushing, or pulling greater that 10 pounds is allowed for 1 week.  When the procedure is scheduled, you will be given a date to have bloodwork and a chest xray done.

## 2014-03-08 NOTE — Progress Notes (Signed)
03/08/2014 [place]   1946-10-27  [id number]  Primary Physician [name], MD Primary Cardiologist: [name] MD Manuel Grant   HPI:  The patient is a very pleasant 68 year old mildly to moderately overweight married Caucasian male father of 53, grandfather to 3 grandchildren who I saw a year ago. He has a history of ischemic heart disease status post cardiac catheterization by myself November 15, 1998 revealing 3-vessel disease with preserved LV function. Medical therapy was recommended at that time. He had a 30% distal left main, a 75% mid LAD, a 50% OM and a total RCA with left-to-right collaterals and normal LV function. His other problems include hypertension and hyperlipidemia. He is totally asymptomatic. His last Myoview performed August 06, 2010 was nonischemic. Disposition the profile performed 03/08/14 revealed a glucose of 98, LDL 31 and HDL 41. The last saw him in October he developed progressive dyspnea on exertion and substernal chest pain.   Current Outpatient Prescriptions  Medication Sig Dispense Refill  . aspirin 81 MG tablet Take 1 tablet (81 mg total) by mouth daily.      . cyanocobalamin (,VITAMIN B-12,) 1000 MCG/ML injection Inject 1 mL (1,000 mcg total) into the muscle every 30 (thirty) days.  12 mL  1  . fenofibrate micronized (LOFIBRA) 134 MG capsule Take 1 capsule (134 mg total) by mouth Nightly.  30 capsule  6  . glipiZIDE (GLUCOTROL XL) 10 MG 24 hr tablet Take 1 tablet (10 mg total) by mouth daily.  30 tablet  11  . lisinopril-hydrochlorothiazide (PRINZIDE,ZESTORETIC) 20-12.5 MG per tablet Take 2 tablets by mouth daily.  60 tablet  10  . lovastatin (MEVACOR) 40 MG tablet Take 1 tablet (40 mg total) by mouth at bedtime.  30 tablet  9  . metFORMIN (GLUCOPHAGE) 1000 MG tablet TAKE 1 TABLET BY MOUTH TWICE A DAY WITH A MEAL  60 tablet  5  . metoprolol succinate (TOPROL-XL) 50 MG 24 hr tablet Take 50 mg by mouth daily.       [name] NEEDLE, DISP,  25 G (BD DISP NEEDLES) 25G X 5/8" MISC 25 g by Does not apply route every 30 (thirty) days.  12 each  0  . nitroGLYCERIN (NITROSTAT) 0.4 MG SL tablet Place 1 tablet (0.4 mg total) under the tongue every 5 (five) minutes as needed for chest pain.  90 tablet  3  . NON FORMULARY Nattokinase Complex one tab daily      . NON FORMULARY Astaxanthin  One 12 mg tablet daily for skin      . NON FORMULARY Diabetes Multi Vitamin and Minerals one tablet daily      . Syringe, Disposable, 1 ML MISC 1 mL by Does not apply route every 30 (thirty) days.  12 each  0  . traMADol (ULTRAM) 50 MG tablet Take 2 tablets by mouth three times daily.      [name] zolpidem (AMBIEN) 10 MG tablet TAKE 1 TABLET BY MOUTH EVERY NIGHT AT BEDTIME AS NEEDED  30 tablet  5  . meloxicam (MOBIC) 7.5 MG tablet Take 7.5 mg by mouth daily as needed.       No current facility-administered medications for this visit.    No Known Allergies  History   Social History  . Marital Status: Married    Spouse Name: N/A    Number of Children: N/A  . Years of Education: N/A   Occupational History  . Not on file.   Social History Main  Topics  . Smoking status: Former Smoker -- 2.00 packs/day for 10 years    Types: Cigarettes    Quit date: 12/28/1974  . Smokeless tobacco: Never Used  . Alcohol Use: Yes     Comment: socially - beer  . Drug Use: No  . Sexual Activity: Not on file   Other Topics Concern  . Not on file   Social History Narrative   Married 1967   Retired from [employer] work     Review of Systems: General: negative for chills, fever, night sweats or weight changes.  Cardiovascular: negative for chest pain, dyspnea on exertion, edema, orthopnea, palpitations, paroxysmal nocturnal dyspnea or shortness of breath Dermatological: negative for rash Respiratory: negative for cough or wheezing Urologic: negative for hematuria Abdominal: negative for nausea, vomiting, diarrhea, bright red blood per rectum, melena, or  hematemesis Neurologic: negative for visual changes, syncope, or dizziness All other systems reviewed and are otherwise negative except as noted above.    Blood pressure 120/72, pulse 72, height 5' 5"  (1.651 m), weight 214 lb (97.07 kg).  General appearance: alert and no distress Neck: no adenopathy, no carotid bruit, no JVD, supple, symmetrical, trachea midline and thyroid not enlarged, symmetric, no tenderness/mass/nodules Lungs: clear to auscultation bilaterally Heart: regular rate and rhythm, S1, S2 normal, no murmur, click, rub or gallop Extremities: extremities normal, atraumatic, no cyanosis or edema  EKG normal sinus rhythm at 71 without ST or T wave changes  ASSESSMENT AND PLAN:   CAD (coronary artery disease) Patient has a history of CAD status post cardiac catheterization by myself 11/15/98 revealing 30% distal left main, 75% mid LAD, 50% obtuse marginal branch and a total RCA with left to right collaterals. Atheromas he's had progressive to exertion and substernal chest pain. Based on the progression of his symptoms and his known CAD I'm going to proceed with outpatient cardiac catheterization via the right radial approach. I suspect he'll need coronary bypass grafting. I've explained the risks and benefits. The patient is a candidate for a drug-eluting stent.  Essential hypertension, benign Controlled on current medications  Pure hypercholesterolemia On statin therapy followed by his PCP. His most recent lipid profile performed 03/08/14 revealed a glucose of 98, LDL of 31 and HDL 41.      [name] MD FACP,FACC,FAHA, [facility] 03/08/2014 3:16 PM

## 2014-03-09 ENCOUNTER — Encounter (HOSPITAL_COMMUNITY): Payer: Self-pay | Admitting: Pharmacy Technician

## 2014-03-12 ENCOUNTER — Ambulatory Visit
Admission: RE | Admit: 2014-03-12 | Discharge: 2014-03-12 | Disposition: A | Discharge status: Home / Self Care | Payer: Commercial Managed Care - HMO | Source: Ambulatory Visit | Attending: Cardiovascular Disease | Admitting: Cardiovascular Disease

## 2014-03-12 DIAGNOSIS — I2510 Atherosclerotic heart disease of native coronary artery without angina pectoris: Secondary | ICD-10-CM

## 2014-03-12 DIAGNOSIS — R079 Chest pain, unspecified: Secondary | ICD-10-CM

## 2014-03-12 LAB — CBC
HEMATOCRIT: 27.2 % — AB (ref 39.0–52.0)
Hemoglobin: 7.5 g/dL — ABNORMAL LOW (ref 13.0–17.0)
MCH: 16.9 pg — ABNORMAL LOW (ref 26.0–34.0)
MCHC: 27.6 g/dL — AB (ref 30.0–36.0)
MCV: 61.4 fL — ABNORMAL LOW (ref 78.0–100.0)
Platelets: 300 10*3/uL (ref 150–400)
RBC: 4.43 MIL/uL (ref 4.22–5.81)
RDW: 19.6 % — ABNORMAL HIGH (ref 11.5–15.5)
WBC: 5.8 10*3/uL (ref 4.0–10.5)

## 2014-03-12 LAB — BASIC METABOLIC PANEL
BUN: 12 mg/dL (ref 6–23)
CALCIUM: 9.4 mg/dL (ref 8.4–10.5)
CO2: 29 meq/L (ref 19–32)
CREATININE: 1.01 mg/dL (ref 0.50–1.35)
Chloride: 102 mEq/L (ref 96–112)
Glucose, Bld: 173 mg/dL — ABNORMAL HIGH (ref 70–99)
Potassium: 3.8 mEq/L (ref 3.5–5.3)
Sodium: 138 mEq/L (ref 135–145)

## 2014-03-12 LAB — TSH: TSH: 1.104 u[IU]/mL (ref 0.350–4.500)

## 2014-03-13 ENCOUNTER — Encounter: Payer: Self-pay | Admitting: *Deleted

## 2014-03-13 LAB — PROTIME-INR
INR: 0.98 (ref <1.50)
Prothrombin Time: 12.9 seconds (ref 11.6–15.2)

## 2014-03-13 LAB — APTT: APTT: 26 s (ref 24–37)

## 2014-03-15 ENCOUNTER — Encounter (HOSPITAL_COMMUNITY): Payer: Self-pay | Admitting: Nurse Practitioner

## 2014-03-15 ENCOUNTER — Ambulatory Visit: Payer: Medicare Other | Admitting: Family Medicine

## 2014-03-15 ENCOUNTER — Inpatient Hospital Stay (HOSPITAL_COMMUNITY)
Admission: RE | Admit: 2014-03-15 | Discharge: 2014-03-18 | DRG: 812 | Disposition: A | Discharge status: Home / Self Care | Payer: Medicare HMO | Source: Ambulatory Visit | Attending: Cardiovascular Disease | Admitting: Cardiovascular Disease

## 2014-03-15 ENCOUNTER — Encounter (HOSPITAL_COMMUNITY)
Admission: RE | Disposition: A | Discharge status: Home / Self Care | Payer: Self-pay | Source: Ambulatory Visit | Attending: Cardiovascular Disease

## 2014-03-15 DIAGNOSIS — Z9889 Other specified postprocedural states: Secondary | ICD-10-CM

## 2014-03-15 DIAGNOSIS — K269 Duodenal ulcer, unspecified as acute or chronic, without hemorrhage or perforation: Secondary | ICD-10-CM

## 2014-03-15 DIAGNOSIS — E1149 Type 2 diabetes mellitus with other diabetic neurological complication: Secondary | ICD-10-CM | POA: Diagnosis present

## 2014-03-15 DIAGNOSIS — I129 Hypertensive chronic kidney disease with stage 1 through stage 4 chronic kidney disease, or unspecified chronic kidney disease: Secondary | ICD-10-CM | POA: Diagnosis present

## 2014-03-15 DIAGNOSIS — D509 Iron deficiency anemia, unspecified: Secondary | ICD-10-CM | POA: Diagnosis not present

## 2014-03-15 DIAGNOSIS — K2960 Other gastritis without bleeding: Secondary | ICD-10-CM | POA: Diagnosis present

## 2014-03-15 DIAGNOSIS — I2510 Atherosclerotic heart disease of native coronary artery without angina pectoris: Secondary | ICD-10-CM

## 2014-03-15 DIAGNOSIS — E538 Deficiency of other specified B group vitamins: Secondary | ICD-10-CM | POA: Diagnosis not present

## 2014-03-15 DIAGNOSIS — Z01818 Encounter for other preprocedural examination: Secondary | ICD-10-CM

## 2014-03-15 DIAGNOSIS — Z8719 Personal history of other diseases of the digestive system: Secondary | ICD-10-CM

## 2014-03-15 DIAGNOSIS — E785 Hyperlipidemia, unspecified: Secondary | ICD-10-CM | POA: Diagnosis present

## 2014-03-15 DIAGNOSIS — K621 Rectal polyp: Secondary | ICD-10-CM

## 2014-03-15 DIAGNOSIS — I200 Unstable angina: Secondary | ICD-10-CM

## 2014-03-15 DIAGNOSIS — K2100 Gastro-esophageal reflux disease with esophagitis, without bleeding: Secondary | ICD-10-CM | POA: Diagnosis present

## 2014-03-15 DIAGNOSIS — M1990 Unspecified osteoarthritis, unspecified site: Secondary | ICD-10-CM | POA: Diagnosis present

## 2014-03-15 DIAGNOSIS — Z79899 Other long term (current) drug therapy: Secondary | ICD-10-CM

## 2014-03-15 DIAGNOSIS — I10 Essential (primary) hypertension: Secondary | ICD-10-CM

## 2014-03-15 DIAGNOSIS — E7800 Pure hypercholesterolemia, unspecified: Secondary | ICD-10-CM | POA: Diagnosis present

## 2014-03-15 DIAGNOSIS — K624 Stenosis of anus and rectum: Secondary | ICD-10-CM | POA: Diagnosis present

## 2014-03-15 DIAGNOSIS — K620 Anal polyp: Secondary | ICD-10-CM | POA: Diagnosis present

## 2014-03-15 DIAGNOSIS — Z9049 Acquired absence of other specified parts of digestive tract: Secondary | ICD-10-CM

## 2014-03-15 DIAGNOSIS — N189 Chronic kidney disease, unspecified: Secondary | ICD-10-CM | POA: Diagnosis present

## 2014-03-15 DIAGNOSIS — Z87891 Personal history of nicotine dependence: Secondary | ICD-10-CM

## 2014-03-15 HISTORY — DX: Unspecified osteoarthritis, unspecified site: M19.90

## 2014-03-15 HISTORY — DX: Deficiency of other specified B group vitamins: E53.8

## 2014-03-15 LAB — GLUCOSE, CAPILLARY
Glucose-Capillary: 133 mg/dL — ABNORMAL HIGH (ref 70–99)
Glucose-Capillary: 171 mg/dL — ABNORMAL HIGH (ref 70–99)
Glucose-Capillary: 114 mg/dL — ABNORMAL HIGH (ref 70–99)

## 2014-03-15 LAB — FERRITIN: Ferritin: 6 ng/mL — ABNORMAL LOW (ref 22–322)

## 2014-03-15 LAB — CBC
HEMATOCRIT: 25.6 % — AB (ref 39.0–52.0)
Hemoglobin: 7.3 g/dL — ABNORMAL LOW (ref 13.0–17.0)
MCH: 17.9 pg — ABNORMAL LOW (ref 26.0–34.0)
MCHC: 28.5 g/dL — AB (ref 30.0–36.0)
MCV: 62.7 fL — ABNORMAL LOW (ref 78.0–100.0)
Platelets: 255 10*3/uL (ref 150–400)
RBC: 4.08 MIL/uL — ABNORMAL LOW (ref 4.22–5.81)
RDW: 19.7 % — ABNORMAL HIGH (ref 11.5–15.5)
WBC: 5.7 10*3/uL (ref 4.0–10.5)

## 2014-03-15 LAB — IRON AND TIBC
Iron: 17 ug/dL — ABNORMAL LOW (ref 42–135)
SATURATION RATIOS: 4 % — AB (ref 20–55)
TIBC: 405 ug/dL (ref 215–435)
UIBC: 388 ug/dL (ref 125–400)

## 2014-03-15 LAB — ABO/RH: ABO/RH(D): B NEG

## 2014-03-15 LAB — PREPARE RBC (CROSSMATCH)

## 2014-03-15 SURGERY — LEFT HEART CATHETERIZATION WITH CORONARY ANGIOGRAM
Anesthesia: LOCAL

## 2014-03-15 MED ORDER — SODIUM CHLORIDE 0.9 % IV SOLN: INTRAVENOUS | Status: DC

## 2014-03-15 MED ORDER — DIPHENHYDRAMINE HCL 25 MG PO CAPS
25.0000 mg | ORAL_CAPSULE | Freq: Once | ORAL | Status: AC
  Administered 2014-03-15: 25 mg via ORAL
  Filled 2014-03-15 (×2): qty 1

## 2014-03-15 MED ORDER — SODIUM CHLORIDE 0.9 % IJ SOLN
3.0000 mL | Freq: Two times a day (BID) | INTRAMUSCULAR | Status: DC
  Administered 2014-03-16 – 2014-03-18 (×5): 3 mL via INTRAVENOUS

## 2014-03-15 MED ORDER — PANTOPRAZOLE SODIUM 40 MG PO TBEC
40.0000 mg | DELAYED_RELEASE_TABLET | Freq: Two times a day (BID) | ORAL | Status: DC
  Administered 2014-03-16: 40 mg via ORAL
  Filled 2014-03-15: qty 1

## 2014-03-15 MED ORDER — SIMVASTATIN 20 MG PO TABS
20.0000 mg | ORAL_TABLET | Freq: Every day | ORAL | Status: DC
  Administered 2014-03-15 – 2014-03-17 (×3): 20 mg via ORAL
  Filled 2014-03-15 (×4): qty 1

## 2014-03-15 MED ORDER — TRAMADOL HCL 50 MG PO TABS
50.0000 mg | ORAL_TABLET | Freq: Three times a day (TID) | ORAL | Status: DC
  Administered 2014-03-15: 50 mg via ORAL
  Filled 2014-03-15: qty 1

## 2014-03-15 MED ORDER — SODIUM CHLORIDE 0.9 % IV SOLN: 250.0000 mL | INTRAVENOUS | Status: DC | PRN

## 2014-03-15 MED ORDER — SODIUM CHLORIDE 0.9 % IJ SOLN: 3.0000 mL | INTRAMUSCULAR | Status: DC | PRN

## 2014-03-15 MED ORDER — HYDROCHLOROTHIAZIDE 25 MG PO TABS
25.0000 mg | ORAL_TABLET | Freq: Every day | ORAL | Status: DC
  Administered 2014-03-15 – 2014-03-18 (×4): 25 mg via ORAL
  Filled 2014-03-15 (×4): qty 1

## 2014-03-15 MED ORDER — FUROSEMIDE 10 MG/ML IJ SOLN
20.0000 mg | Freq: Once | INTRAMUSCULAR | Status: AC
  Administered 2014-03-16: 20 mg via INTRAVENOUS
  Filled 2014-03-15: qty 2

## 2014-03-15 MED ORDER — SODIUM CHLORIDE 0.9 % IV SOLN
INTRAVENOUS | Status: DC
Start: 2014-03-15 — End: 2014-03-15

## 2014-03-15 MED ORDER — DIAZEPAM 5 MG PO TABS: 5.0000 mg | ORAL_TABLET | ORAL | Status: DC

## 2014-03-15 MED ORDER — ZOLPIDEM TARTRATE 10 MG PO TABS: 10.0000 mg | ORAL_TABLET | Freq: Every day | ORAL | Status: DC

## 2014-03-15 MED ORDER — GLIPIZIDE ER 10 MG PO TB24
10.0000 mg | ORAL_TABLET | Freq: Every day | ORAL | Status: DC
  Administered 2014-03-15 – 2014-03-18 (×4): 10 mg via ORAL
  Filled 2014-03-15 (×4): qty 1

## 2014-03-15 MED ORDER — ACETAMINOPHEN 325 MG PO TABS
650.0000 mg | ORAL_TABLET | Freq: Once | ORAL | Status: AC
  Administered 2014-03-15: 650 mg via ORAL
  Filled 2014-03-15: qty 2

## 2014-03-15 MED ORDER — NITROGLYCERIN 0.4 MG SL SUBL: 0.4000 mg | SUBLINGUAL_TABLET | SUBLINGUAL | Status: DC | PRN

## 2014-03-15 MED ORDER — ZOLPIDEM TARTRATE 5 MG PO TABS
5.0000 mg | ORAL_TABLET | Freq: Every day | ORAL | Status: DC
  Administered 2014-03-16 – 2014-03-17 (×2): 5 mg via ORAL
  Filled 2014-03-15 (×2): qty 1

## 2014-03-15 MED ORDER — NITROGLYCERIN 0.4 MG SL SUBL
0.4000 mg | SUBLINGUAL_TABLET | SUBLINGUAL | Status: DC | PRN
  Administered 2014-03-15: 0.4 mg via SUBLINGUAL
  Filled 2014-03-15: qty 25

## 2014-03-15 MED ORDER — LISINOPRIL-HYDROCHLOROTHIAZIDE 20-12.5 MG PO TABS: 2.0000 | ORAL_TABLET | Freq: Every day | ORAL | Status: DC

## 2014-03-15 MED ORDER — ASPIRIN 81 MG PO CHEW: 81.0000 mg | CHEWABLE_TABLET | ORAL | Status: DC

## 2014-03-15 MED ORDER — METOPROLOL SUCCINATE ER 50 MG PO TB24
50.0000 mg | ORAL_TABLET | Freq: Every day | ORAL | Status: DC
  Administered 2014-03-15: 50 mg via ORAL
  Filled 2014-03-15 (×2): qty 1

## 2014-03-15 MED ORDER — LISINOPRIL 40 MG PO TABS
40.0000 mg | ORAL_TABLET | Freq: Every day | ORAL | Status: DC
  Administered 2014-03-15 – 2014-03-18 (×4): 40 mg via ORAL
  Filled 2014-03-15 (×4): qty 1

## 2014-03-15 MED ORDER — NITROGLYCERIN 0.4 MG SL SUBL
SUBLINGUAL_TABLET | SUBLINGUAL | Status: AC
  Filled 2014-03-15: qty 1

## 2014-03-15 MED ORDER — SODIUM CHLORIDE 0.9 % IV SOLN
INTRAVENOUS | Status: DC
  Administered 2014-03-15: 19:00:00 via INTRAVENOUS
  Administered 2014-03-16: 500 mL via INTRAVENOUS

## 2014-03-15 MED ORDER — INSULIN ASPART 100 UNIT/ML ~~LOC~~ SOLN
0.0000 [IU] | Freq: Three times a day (TID) | SUBCUTANEOUS | Status: DC
  Administered 2014-03-17: 2 [IU] via SUBCUTANEOUS
  Administered 2014-03-17: 3 [IU] via SUBCUTANEOUS

## 2014-03-15 NOTE — Progress Notes (Signed)
REPORT CALLED TO RN FOR ROOM [phone] AND TRANSFERRED VIA STRETCHER

## 2014-03-15 NOTE — Consult Note (Signed)
[facility] Consult: 3:49 PM 03/15/2014  LOS: 0 days    Referring Provider: Dr Quay Burow Primary Care Physician:  [name], MD Primary Gastroenterologist:  Dr at [facility]    Reason for Consultation:  Microcytic anemia.    HPI: Manuel Grant is a 68 y.o. male.  Very pleasant man.  Remote subtotal colectomy with J pouch (for continence) for difficult to manage Ulcerative colitis.  Has had no problems since.  He has 8 to 10 small volume loose stools per day, this is stable.  Hx of CAD on cath in 1999 which required no intervention.  Has been having exertional angina and dyspnea and was set for cardiac cath today.  This cancelled because preop CBC says Hgb 7.5 earlier this week.  Today it is 7.3.  MCV in mid 60s.  Hgb of 01/2012 was 14.5. Has seen no bloody or melenic stools, however never looks at his stools. Takes once daily Mobic. No nausea, no dysphagia, no weight loss. B12 shots regularly.     Past Medical History  Diagnosis Date  . Ulcerative colitis     s/p colectomy ~ 1995 at [facility]  . Renal stones     uric acid stones  . Chronic kidney disease   . Hypertension   . Hyperlipidemia   . Diabetes mellitus   . Rosacea   . Insomnia   . Hiatal hernia   . Arthritis     Knee OA- per [facility]- injected prev by ortho  . Coronary artery disease     a. 10/1998 Cath: LM 30d, LAD 22m OM 50, RCA 100 w/ L->R collats, nl LV ->Med Rx;  b. 07/2010 MV: no ischemia.  .[name] KitchenB12 deficiency   . Osteoarthritis     Past Surgical History  Procedure Laterality Date  . Subtotal colectomy  ~1995    [facility] . Knee arthroscopy Bilateral     Prior to Admission medications   Medication Sig Start Date End Date Taking? Authorizing Provider  aspirin 81 MG tablet Take 81 mg by mouth daily.  03/07/13  Yes GTonia Ghent MD  cyanocobalamin (,VITAMIN B-12,) 1000 MCG/ML injection Inject 1,000 mcg into the muscle every 30 (thirty) days. 09/07/13  Yes GTonia Ghent MD  glipiZIDE (GLUCOTROL XL) 10 MG 24 hr tablet Take 10 mg by mouth daily. 03/21/13  Yes GTonia Ghent MD  lisinopril-hydrochlorothiazide (PRINZIDE,ZESTORETIC) 20-12.5 MG per tablet Take 2 tablets by mouth daily. 12/13/13  Yes JLorretta Harp MD  lovastatin (MEVACOR) 40 MG tablet Take 40 mg by mouth at bedtime. 12/05/13  Yes JLorretta Harp MD  meloxicam (MOBIC) 7.5 MG tablet Take 7.5 mg by mouth daily.  02/16/14  Yes Historical Provider, MD  metoprolol succinate (TOPROL-XL) 50 MG 24 hr tablet Take 50 mg by mouth daily.    Yes Historical Provider, MD  NON FORMULARY Take 1 tablet by mouth daily. Nattokinase Complex one tab daily   Yes Historical Provider, MD  NON FORMULARY Take 12 mg by mouth daily. Astaxanthin  One 12 mg tablet daily for skin   Yes Historical Provider, MD  NON FORMULARY Take 1 tablet by mouth daily. Diabetes Multi Vitamin and Minerals one tablet daily   Yes Historical Provider, MD  traMADol (ULTRAM) 50 MG tablet Take 50 mg by mouth 3 (three) times daily. Take 2 tablets by mouth three times daily.   Yes Historical Provider, MD  zolpidem (AMBIEN) 10 MG tablet Take 10 mg by mouth at bedtime.   Yes Historical Provider, MD  NEEDLE, DISP, 25 G (BD DISP NEEDLES) 25G X 5/8" MISC 25 g by Does not apply route every 30 (thirty) days. 03/29/12   Tonia Ghent, MD  nitroGLYCERIN (NITROSTAT) 0.4 MG SL tablet Place 0.4 mg under the tongue every 5 (five) minutes as needed for chest pain. 03/07/14   [name], MD  Syringe, Disposable, 1 ML MISC 1 mL by Does not apply route every 30 (thirty) days. 03/29/12   Tonia Ghent, MD    Scheduled Meds: . aspirin  81 mg Oral Pre-Cath  . diazepam  5 mg Oral On Call  . glipiZIDE  10 mg Oral Daily  . lisinopril-hydrochlorothiazide  2 tablet Oral Daily  . metoprolol succinate   50 mg Oral Daily  . nitroGLYCERIN      . pantoprazole  40 mg Oral BID  . simvastatin  20 mg Oral q1800  . traMADol  50 mg Oral TID  . zolpidem  10 mg Oral QHS   Infusions: . [START ON 03/16/2014] sodium chloride    . sodium chloride     PRN Meds: nitroGLYCERIN, nitroGLYCERIN, sodium chloride   Allergies as of 03/08/2014  . (No Known Allergies)    Family History  Problem Relation Age of Onset  . Heart disease Mother   . Diabetes Mother   . COPD Father   . Colon cancer Neg Hx   . Prostate cancer Neg Hx        Crohn's disease in sister (died at 49 with numerous medical problems), son (s/p partial SB resection)       Kidney disease  In sister    History   Social History  . Marital Status: Married    Spouse Name: N/A    Number of Children: N/A  . Years of Education: N/A   Occupational History  . Not on file.   Social History Main Topics  . Smoking status: Former Smoker -- 2.00 packs/day for 10 years    Types: Cigarettes    Quit date: 12/28/1974  . Smokeless tobacco: Never Used  . Alcohol Use: Yes     Comment: socially - beer  . Drug Use: No  . Sexual Activity: Not on file   Other Topics Concern  . Not on file   Social History Narrative   Married 1967   Retired from [employer] work   Lives in [place] with his wife.    REVIEW OF SYSTEMS: Constitutional:  Exertional dyspnea.  No profound weakness ENT:  No nose bleeds Pulm:  No cough or pleuritic pain.  DOE CV:  No palpitations, no LE edema.  GU:  No hematuria, no frequency GI:  Per HPI Heme:  No hx anemia or need of supplemental iron, B12 etc   Transfusions:  none Neuro:  No headaches, no peripheral tingling or numbness Derm:  No itching, no rash or sores.  Endocrine:  No sweats or chills.  No polyuria or dysuria Immunization:  Up to date on multiple vaccines Travel:  None  beyond local counties in last few months.    PHYSICAL EXAM: Vital signs in last 24 hours: Filed Vitals:   03/15/14 0953    BP: 139/75  Pulse: 79  Temp: 99.6 F (37.6 C)  Resp: 18   Wt Readings from Last 3 Encounters:  03/15/14 97.07 kg (214 lb)  03/15/14 97.07 kg (214 lb)  03/08/14 97.07 kg (214 lb)    General: pleasant, well-appearing WM.  comfortable Head:  No asymmetry  Eyes:  No icterus or pallor Ears:  Not HOH  Nose:  Rhinophyma, no congestion Mouth:  Clear and moist.  Good dental hygeine Neck:  No mass, no JVD Lungs:  Clear bil.  No dyspnea or cough Heart: RRR Abdomen:  Soft, NT, ND.  No mass no HSM.  Ventral/incisional hernia in LLQ .   Rectal: no material to hemocult.  No mass   Musc/Skeltl: no joint swelling or contracture. Extremities:  No CCE  Neurologic:  Pleasant, oriented x 3 Skin:  No rash, no telangectasia Tattoos:  none Nodes:  No cervical adenopathy.    Psych:  Pleasant, relaxed, alert.   Intake/Output from previous day:   Intake/Output this shift:    LAB RESULTS:  Recent Labs  03/15/14 1030  WBC 5.7  HGB 7.3*  HCT 25.6*  PLT 255   BMET Lab Results  Component Value Date   NA 138 03/12/2014   NA 140 03/08/2014   NA 139 02/27/2013   K 3.8 03/12/2014   K 3.9 03/08/2014   K 4.1 02/27/2013   CL 102 03/12/2014   CL 104 03/08/2014   CL 103 02/27/2013   CO2 29 03/12/2014   CO2 29 03/08/2014   CO2 29 02/27/2013   GLUCOSE 173* 03/12/2014   GLUCOSE 131* 03/08/2014   GLUCOSE 104* 02/27/2013   BUN 12 03/12/2014   BUN 15 03/08/2014   BUN 18 02/27/2013   CREATININE 1.01 03/12/2014   CREATININE 1.1 03/08/2014   CREATININE 1.3 02/27/2013   CALCIUM 9.4 03/12/2014   CALCIUM 9.4 03/08/2014   CALCIUM 9.3 02/27/2013   LFT No results found for this basename: PROT, ALBUMIN, AST, ALT, ALKPHOS, BILITOT, BILIDIR, IBILI,  in the last 72 hours PT/INR Lab Results  Component Value Date   INR 0.98 03/12/2014    RADIOLOGY STUDIES: No results found.  ENDOSCOPIC STUDIES: Proctoscopy at [facility] in 2013.   IMPRESSION:   *  Microcytic anemia.  Symptomatic. Not clear this is a GI source.  ? Occult  bleeding from an NSAID ulcer?    *  Remote hx Crohn's disease.  S/p subtotal colectomy and J pouch procedure (for continence) in ~1995.  Had difficult to manage Crohn's for many years before that.  Subsequent proctoscopic monitoring for dysplasia every 2 years has been negative.  Last done about 18 months ago at [facility].  Strong family hx of IBD  *  Angina.  Cardiac cath cancelled due to anemia.[name]     PLAN:     *  EGD and flex sig tomorrow. Transfusion has not been ordered. Should we give one PRBC? Ordered CBC for the AM  Azucena Freed  03/15/2014, 3:49 PM Pager: [phone]

## 2014-03-15 NOTE — H&P (Signed)
Patient ID: Manuel Grant MRN: [id number], DOB/AGE: [birth year]   Admit date: 03/15/2014  Primary Physician: [name], MD Primary Cardiologist: Adora Fridge, MD   Pt. Profile:  68 y/o male with a h/o medically managed CAD who was recently seen in clinic 2/2 progressive angina, was set up for cath today, but has been found to have severe microcytic/hypochromic anemia.  Problem List  Past Medical History  Diagnosis Date  . Ulcerative colitis     s/p colectomy  . Renal stones     uric acid stones  . Chronic kidney disease   . Hypertension   . Hyperlipidemia   . Diabetes mellitus   . Rosacea   . Insomnia   . Hiatal hernia   . Arthritis     Knee OA- per [facility]- injected prev by ortho  . Coronary artery disease     a. 10/1998 Cath: LM 30d, LAD 14m OM 50, RCA 100 w/ L->R collats, nl LV ->Med Rx;  b. 07/2010 MV: no ischemia.  .[name] KitchenB12 deficiency   . Osteoarthritis     Past Surgical History  Procedure Laterality Date  . Colectomy    . Knee surgery      bilateral    Allergies  No Known Allergies  HPI  68y/o male with a prior h/o CAD s/p cath in 1999 revealing moderate-severe multivessel CAD as outlined above.  He was medically managed and has done well over the years with a neg MV in 07/2010.  He also has a h/o htn, hl, dm, and ulcerative colitis [facility]. He was in his usoh until about 2-3 mos ago when he began to experience reduced exercise tolerance with progressive dyspnea on exertion, profound exertional wkns, and exertional angina.  His family noted that he would also become pale.  Over the past week, Ss have been occurring with minimal exertion and so he was seen in cardiology clinic on 3/12.  Given progressive Ss, arrangements were made for diagnostic cath.  Pt had labs drawn on 3/16 revealing and H/H of 7.5/27.2, MCV of 61.4, and MCHC of 27.6.  Coags were nl.  He presented today for cath and repeat CBC  corroborated previous values.  As a result, cath was cancelled and he will be admitted for GI w/u.  He denies any BRBPR or melena.  He does take mobic and asa @ home but has not been using and other nsaids.  Home Medications  Prior to Admission medications   Medication Sig Start Date End Date Taking? Authorizing Provider  aspirin 81 MG tablet Take 81 mg by mouth daily. 03/07/13  Yes GTonia Ghent MD  cyanocobalamin (,VITAMIN B-12,) 1000 MCG/ML injection Inject 1,000 mcg into the muscle every 30 (thirty) days. 09/07/13  Yes GTonia Ghent MD  glipiZIDE (GLUCOTROL XL) 10 MG 24 hr tablet Take 10 mg by mouth daily. 03/21/13  Yes GTonia Ghent MD  lisinopril-hydrochlorothiazide (PRINZIDE,ZESTORETIC) 20-12.5 MG per tablet Take 2 tablets by mouth daily. 12/13/13  Yes JLorretta Harp MD  lovastatin (MEVACOR) 40 MG tablet Take 40 mg by mouth at bedtime. 12/05/13  Yes JLorretta Harp MD  meloxicam (MOBIC) 7.5 MG tablet Take 7.5 mg by mouth daily.  02/16/14  Yes Historical Provider, MD  metoprolol succinate (TOPROL-XL) 50 MG 24 hr tablet Take 50 mg by mouth daily.    Yes Historical Provider, MD  NON FORMULARY Take 1 tablet by mouth  daily. Nattokinase Complex one tab daily   Yes Historical Provider, MD  NON FORMULARY Take 12 mg by mouth daily. Astaxanthin  One 12 mg tablet daily for skin   Yes Historical Provider, MD  NON FORMULARY Take 1 tablet by mouth daily. Diabetes Multi Vitamin and Minerals one tablet daily   Yes Historical Provider, MD  traMADol (ULTRAM) 50 MG tablet Take 50 mg by mouth 3 (three) times daily. Take 2 tablets by mouth three times daily.   Yes Historical Provider, MD  zolpidem (AMBIEN) 10 MG tablet Take 10 mg by mouth at bedtime.   Yes Historical Provider, MD  NEEDLE, DISP, 25 G (BD DISP NEEDLES) 25G X 5/8" MISC 25 g by Does not apply route every 30 (thirty) days. 03/29/12   Tonia Ghent, MD  nitroGLYCERIN (NITROSTAT) 0.4 MG SL tablet Place 0.4 mg under the tongue every 5 (five)  minutes as needed for chest pain. 03/07/14   [name], MD  Syringe, Disposable, 1 ML MISC 1 mL by Does not apply route every 30 (thirty) days. 03/29/12   Tonia Ghent, MD   Family History  Family History  Problem Relation Age of Onset  . Heart disease Mother   . Diabetes Mother   . COPD Father   . Colon cancer Neg Hx   . Prostate cancer Neg Hx    Social History  History   Social History  . Marital Status: Married    Spouse Name: N/A    Number of Children: N/A  . Years of Education: N/A   Occupational History  . Not on file.   Social History Main Topics  . Smoking status: Former Smoker -- 2.00 packs/day for 10 years    Types: Cigarettes    Quit date: 12/28/1974  . Smokeless tobacco: Never Used  . Alcohol Use: Yes     Comment: socially - beer  . Drug Use: No  . Sexual Activity: Not on file   Other Topics Concern  . Not on file   Social History Narrative   Married 1967   Retired from [employer] work   Lives in [place] with his wife.    Review of Systems General:  +++ wkns/fatigue with activity.  No chills, fever, night sweats or weight changes.  Cardiovascular:  +++ chest pain, +++ dyspnea on exertion, edema, orthopnea, palpitations, paroxysmal nocturnal dyspnea. Dermatological: No rash, lesions/masses Respiratory: No cough, +++ dyspnea Urologic: No hematuria, dysuria Abdominal:   No nausea, vomiting, diarrhea, bright red blood per rectum, melena, or hematemesis Neurologic:  No visual changes, wkns, changes in mental status. All other systems reviewed and are otherwise negative except as noted above.  Physical Exam  Blood pressure 139/75, pulse 79, temperature 99.6 F (37.6 C), temperature source Oral, resp. rate 18, height 5' 7"  (1.702 m), weight 214 lb (97.07 kg), SpO2 100.00%.  General: Pleasant, NAD Psych: Normal affect. Neuro: Alert and oriented X 3. Moves all extremities spontaneously. HEENT: Normal  Neck: Supple without bruits or  JVD. Lungs:  Resp regular and unlabored, CTA. Heart: RRR no s3, s4, or murmurs. Abdomen: Soft, non-tender, non-distended, BS + x 4.  Extremities: No clubbing, cyanosis or edema. DP/PT/Radials 2+ and equal bilaterally.  Labs  Lab Results  Component Value Date   WBC 5.7 03/15/2014   HGB 7.3* 03/15/2014   HCT 25.6* 03/15/2014   MCV 62.7* 03/15/2014   PLT 255 03/15/2014    Recent Labs Lab 03/12/14 1246  NA 138  K 3.8  CL  102  CO2 29  BUN 12  CREATININE 1.01  CALCIUM 9.4  GLUCOSE 173*   Lab Results  Component Value Date   CHOL 98 03/08/2014   HDL 41.30 03/08/2014   LDLCALC 31 03/08/2014   TRIG 129.0 03/08/2014   Radiology/Studies  Dg Chest 2 View  03/12/2014   CLINICAL DATA:  Pre angio procedure.  Chest pain fatigue.  EXAM: CHEST  2 VIEW  COMPARISON:  None.  FINDINGS: Trachea is midline. Heart size normal. Mild scarring at the lung bases. Lungs are otherwise clear. No pleural fluid. Degenerative changes are seen in the spine. Mild compression of a mid thoracic vertebral body is age indeterminate.  IMPRESSION: 1. No acute findings. 2. Mild compression of a mid thoracic vertebral body is age indeterminate.   Electronically Signed   By: Lorin Picket M.D.   On: 03/12/2014 14:12   ECG  RSR 74, no acute st/t changes.  ASSESSMENT AND PLAN  1.  Microcytic/Hypochromic Anemia:  GI consult pending.  Will check iron studies, guaiac stools, type and screen.  Hold ASA/Mobic.  Add PPI BID.  Transfuse after iron studies drawn.  2.  [place]/CAD:  Pending catheterization in the setting of progressive exertional angina.  Known moderately severe CAD by cath in 1999 with non-ischemic MV in 2011.  Plan cath once cleared by GI.  Hold ASA for the time being.  Cont statin/bb.  3.  DM:  Cont home regimen.  Add SSI.  4.  HTN:  Stable.    5.  HL:  LDL 31.  Cont statin.  LFT's nl earlier this month.  6.  OA:  D/c mobic.  Signed, Murray Hodgkins, NP 03/15/2014, 12:44 PM   Agree with note by Ignacia Bayley NP. Pt was being admitted for cath. His pre procedure lads were remarkable for a HGB in the 7 range. I am going to postpone the cath, arrange for him to be transfused and get a GI eval. We will precede with cath once his bleeding source has been identified and treated.  [name], M.D., [place], [facility], [place] [address] [place], [place]  [address]  [phone] 03/15/2014 4:01 PM

## 2014-03-15 NOTE — Consult Note (Signed)
Patient seen, examined, and I agree with the above documentation, including the assessment and plan. Microcytic anemia without over blood loss.  Symptomatic anemia with angina and plans for cath, put on hold given anemia Hx of UC with colectomy and report of J-pouch, unclear if he has rectal stump Will plan EGD, flex sigmoidoscopy/ileoscopy to evaluate for source of possible GI blood loss. The nature of the procedure, as well as the risks, benefits, and alternatives were carefully and thoroughly reviewed with the patient. Ample time for discussion and questions allowed. The patient understood, was satisfied, and agreed to proceed.

## 2014-03-16 ENCOUNTER — Encounter (HOSPITAL_COMMUNITY)
Admission: RE | Disposition: A | Discharge status: Home / Self Care | Payer: Self-pay | Source: Ambulatory Visit | Attending: Cardiovascular Disease

## 2014-03-16 ENCOUNTER — Encounter (HOSPITAL_COMMUNITY): Payer: Self-pay | Admitting: *Deleted

## 2014-03-16 DIAGNOSIS — Z8719 Personal history of other diseases of the digestive system: Secondary | ICD-10-CM

## 2014-03-16 DIAGNOSIS — K269 Duodenal ulcer, unspecified as acute or chronic, without hemorrhage or perforation: Secondary | ICD-10-CM

## 2014-03-16 DIAGNOSIS — E538 Deficiency of other specified B group vitamins: Secondary | ICD-10-CM

## 2014-03-16 HISTORY — PX: FLEXIBLE SIGMOIDOSCOPY: SHX5431

## 2014-03-16 HISTORY — PX: ESOPHAGOGASTRODUODENOSCOPY: SHX5428

## 2014-03-16 LAB — GLUCOSE, CAPILLARY
GLUCOSE-CAPILLARY: 143 mg/dL — AB (ref 70–99)
GLUCOSE-CAPILLARY: 141 mg/dL — AB (ref 70–99)
Glucose-Capillary: 156 mg/dL — ABNORMAL HIGH (ref 70–99)
Glucose-Capillary: 196 mg/dL — ABNORMAL HIGH (ref 70–99)

## 2014-03-16 LAB — CBC
HCT: 32 % — ABNORMAL LOW (ref 39.0–52.0)
Hemoglobin: 9.5 g/dL — ABNORMAL LOW (ref 13.0–17.0)
MCH: 19.1 pg — AB (ref 26.0–34.0)
MCHC: 29.7 g/dL — ABNORMAL LOW (ref 30.0–36.0)
MCV: 64.4 fL — AB (ref 78.0–100.0)
Platelets: 261 10*3/uL (ref 150–400)
RBC: 4.97 MIL/uL (ref 4.22–5.81)
RDW: 20.8 % — ABNORMAL HIGH (ref 11.5–15.5)
WBC: 7.6 10*3/uL (ref 4.0–10.5)

## 2014-03-16 SURGERY — EGD (ESOPHAGOGASTRODUODENOSCOPY)
Anesthesia: Moderate Sedation

## 2014-03-16 MED ORDER — TRAMADOL HCL 50 MG PO TABS
150.0000 mg | ORAL_TABLET | Freq: Two times a day (BID) | ORAL | Status: DC
  Administered 2014-03-16 – 2014-03-18 (×5): 150 mg via ORAL
  Filled 2014-03-16 (×5): qty 3

## 2014-03-16 MED ORDER — DIPHENHYDRAMINE HCL 50 MG/ML IJ SOLN
INTRAMUSCULAR | Status: AC
  Filled 2014-03-16: qty 1

## 2014-03-16 MED ORDER — BUTAMBEN-TETRACAINE-BENZOCAINE 2-2-14 % EX AERO
INHALATION_SPRAY | CUTANEOUS | Status: DC | PRN
  Administered 2014-03-16: 2 via TOPICAL

## 2014-03-16 MED ORDER — FENTANYL CITRATE 0.05 MG/ML IJ SOLN
INTRAMUSCULAR | Status: AC
  Filled 2014-03-16: qty 4

## 2014-03-16 MED ORDER — MIDAZOLAM HCL 5 MG/ML IJ SOLN
INTRAMUSCULAR | Status: AC
  Filled 2014-03-16: qty 3

## 2014-03-16 MED ORDER — PANTOPRAZOLE SODIUM 40 MG PO TBEC
40.0000 mg | DELAYED_RELEASE_TABLET | ORAL | Status: DC
  Administered 2014-03-17 – 2014-03-18 (×2): 40 mg via ORAL
  Filled 2014-03-16 (×2): qty 1

## 2014-03-16 MED ORDER — MIDAZOLAM HCL 10 MG/2ML IJ SOLN
INTRAMUSCULAR | Status: DC | PRN
  Administered 2014-03-16 (×3): 2 mg via INTRAVENOUS

## 2014-03-16 MED ORDER — SODIUM CHLORIDE 0.9 % IV SOLN
500.0000 mg | Freq: Every day | INTRAVENOUS | Status: AC
  Administered 2014-03-16 – 2014-03-17 (×2): 500 mg via INTRAVENOUS
  Filled 2014-03-16 (×3): qty 10

## 2014-03-16 MED ORDER — METOPROLOL SUCCINATE ER 50 MG PO TB24
75.0000 mg | ORAL_TABLET | Freq: Every day | ORAL | Status: DC
  Administered 2014-03-16 – 2014-03-18 (×3): 75 mg via ORAL
  Filled 2014-03-16 (×3): qty 1

## 2014-03-16 MED ORDER — ISOSORBIDE MONONITRATE ER 30 MG PO TB24
30.0000 mg | ORAL_TABLET | Freq: Every day | ORAL | Status: DC
  Administered 2014-03-16: 30 mg via ORAL
  Filled 2014-03-16 (×2): qty 1

## 2014-03-16 MED ORDER — FENTANYL CITRATE 0.05 MG/ML IJ SOLN
INTRAMUSCULAR | Status: DC | PRN
  Administered 2014-03-16 (×2): 25 ug via INTRAVENOUS
  Administered 2014-03-16: 12.5 ug via INTRAVENOUS

## 2014-03-16 MED ORDER — SODIUM CHLORIDE 0.9 % IV SOLN
25.0000 mg | Freq: Once | INTRAVENOUS | Status: AC
  Administered 2014-03-16: 25 mg via INTRAVENOUS
  Filled 2014-03-16: qty 0.5

## 2014-03-16 NOTE — Progress Notes (Signed)
UR Completed Zafiro Routson Graves-Bigelow, RN,BSN [phone]  

## 2014-03-16 NOTE — Op Note (Signed)
[facility] [place], [address]   FLEXIBLE SIGMOIDOSCOPY PROCEDURE REPORT  PATIENT: Manuel Grant, Manuel Grant  MR#: [id number] BIRTHDATE: 14-Mar-1946 , 68  yrs. old GENDER: Male ENDOSCOPIST: Jerene Bears, MD PROCEDURE DATE:  03/16/2014 PROCEDURE:   Sigmoidoscopy with biopsy ASA CLASS:   Class III INDICATIONS:iron deficiency anemia. MEDICATIONS: Fentanyl 62.5 mcg IV and Versed 6 mg IV  DESCRIPTION OF PROCEDURE:   After the risks benefits and alternatives of the procedure were thoroughly explained, informed consent was obtained.  Digital rectal exam revealed stenosis of the anal canal. The [id number] ([id number])  endoscope was introduced through the anus  and advanced to the terminal ileum ]without limitation. No adverse events experienced.   The quality of the prep was fair/no prep.  The instrument was then slowly withdrawn as the mucosa was fully examined.     FINDINGS:    Immediately upon entering the rectal stump there was an anastomotic ulcer with visible staples encountered. This is benign appearing without active bleeding. Multiple biopsies performed.Above this anastomosis the J pouch/ileum was seen. There was one nonbleeding shallow clean-based ulcer. No other inflammation seen in the small bowel. Multiple biopsies performed.There was a blind short rectal stump which appeared unremarkable. There was an anal canal polyp seen which was biopsied .  Retroflexion with poor visibility due to lack of prep.   The scope was then withdrawn from the patient and the procedure terminated.  COMPLICATIONS: There were no complications.  ENDOSCOPIC IMPRESSION: 1.  Status post subtotal colectomy with small rectal stump, anastomotic ulcer with visible staples. Biopsies 2.  Distal ileum with small ulcer, otherwise unremarkable.  Biopsies 3.  Mild anal stenosis with anal canal polyp.  Biopsies.  RECOMMENDATIONS: 1.  await biopsy results 2.  VSL# 3 3.   Follow-up with Dr.  Hester Mates and [facility] and in our office eSigned:  Jerene Bears, MD 03/16/2014 2:09 PM   CC: The patient.

## 2014-03-16 NOTE — Progress Notes (Signed)
Met with pt and wife this evening after procedures earlier today EGD - Mild gastritis with duodenal ulcers (possible source of blood loss, but no active bleeding) Flex sig - anastomotic ulcer Plan daily PPI, IV iron infusion (once tonight, final dose in the morning), and VSL#3 at discharge Will follow-up in the office with me and with Dr. Hester Mates his colorectal surgeon at [facility] Avoid NSAIDs, daily ASA 81 mg okay

## 2014-03-16 NOTE — Progress Notes (Signed)
Patient Name: Manuel Grant Date of Encounter: 03/16/2014  Principal Problem:   Microcytic hypochromic anemia Active Problems:   Diabetes mellitus type 2 with neurological manifestations   Essential hypertension, benign   Pure hypercholesterolemia   CAD (coronary artery disease)   B12 deficiency   Unstable angina   Osteoarthritis    SUBJECTIVE: No chest pain or SOB. Feels fine after transfusion. No awareness of any bleeding, ever.  OBJECTIVE Filed Vitals:   03/16/14 0130 03/16/14 0145 03/16/14 0245 03/16/14 0345  BP: 123/60 141/83 151/103 130/72  Pulse: 69 83 72 73  Temp: 98.3 F (36.8 C) 98.3 F (36.8 C) 98.3 F (36.8 C) 99.3 F (37.4 C)  TempSrc: Oral Oral Oral Oral  Resp: 18 18 18 18   Height:      Weight:      SpO2: 100% 100% 95% 98%    Intake/Output Summary (Last 24 hours) at 03/16/14 0941 Last data filed at 03/16/14 0245  Gross per 24 hour  Intake    575 ml  Output   1800 ml  Net  -1225 ml   Filed Weights   03/15/14 0953  Weight: 214 lb (97.07 kg)    PHYSICAL EXAM General: Well developed, well nourished, male in no acute distress. Head: Normocephalic, atraumatic.  Neck: Supple without bruits, JVD not elevated. Lungs:  Resp regular and unlabored, CTA. Heart: RRR, S1, S2, no S3, S4, soft systolic murmur; no rub. Abdomen: Soft, non-tender, non-distended, BS + x 4.  Extremities: No clubbing, cyanosis, no edema.  Neuro: Alert and oriented X 3. Moves all extremities spontaneously. Psych: Normal affect.  LABS: CBC: Recent Labs  03/15/14 1030 03/16/14 0523  WBC 5.7 7.6  HGB 7.3* 9.5*  HCT 25.6* 32.0*  MCV 62.7* 64.4*  PLT 255 261   Anemia Panel: Recent Labs  03/15/14 1600  FERRITIN 6*  TIBC 405  IRON 17*    TELE:  SR, no significant arrhythmia or ectopy      Radiology/Studies: No results found.   Current Medications:  . glipiZIDE  10 mg Oral Daily  . hydrochlorothiazide  25 mg Oral Daily  . insulin aspart  0-15 Units  Subcutaneous TID WC  . lisinopril  40 mg Oral Daily  . metoprolol succinate  50 mg Oral Daily  . pantoprazole  40 mg Oral BID  . simvastatin  20 mg Oral q1800  . sodium chloride  3 mL Intravenous Q12H  . traMADol  50 mg Oral TID  . zolpidem  5 mg Oral QHS   . sodium chloride 20 mL/hr at 03/15/14 1905    ASSESSMENT AND PLAN:  Principal Problem:   Microcytic hypochromic anemia - GI eval today, f/u on results, Mobic D/C'd  Active Problems:   Diabetes mellitus type 2 with neurological manifestations - continue home rx w/ SSI    Essential hypertension, benign - BP running a little high, on home Rx, not bradycardic, will increase Toprol XL to 75 mg daily and follow.    Pure hypercholesterolemia - continue statin, recent profile OK    CAD (coronary artery disease) - no ischemic symptoms, MD advise on adding nitrates to regimen    B12 deficiency - follow    Unstable angina - cath when medically stable    Osteoarthritis - Tylenol for now. May need to schedule it.   Signed, Rosaria Ferries , PA-C 9:41 AM 03/16/2014  I seen and evaluated the patient this morning along with Rosaria Ferries, PA-C. I agree with her  findings, examination recommendations. He was initially intended undergo cardiac catheterization yesterday, was found to have a new diagnosis of anemia. He is therefore admitted and transfused 2 units packed red blood cells. He is an appropriate increase in hematocrit. He is due to undergo GI endoscopy procedures to evaluate for source of bleeding.  He is otherwise relatively stable. We'll continue his current medications and add Imdur since his presentation was with angina. He has known existing CAD. One potential source of his angina has been worsening could be versus demand with known fixed defects now exacerbated by anemia.  Is mainly in the hall without any difficulty. After discussing with Dr. Gwenlyn Found, the plan will be to monitor him following the procedure to ensure his  hemoglobin stayed stable. That depending on his symptoms, GI recommendations would either schedule for cardiac catheterization next week versus after a designated amount of time with no anticoagulation or antiplatelet agents.  Leonie Man, M.D., M.S. Interventional Cardiologist  [place] Pager # [phone] 03/16/2014

## 2014-03-16 NOTE — Care Management Note (Unsigned)
    Page 1 of 1   03/16/2014     12:05:01 PM   CARE MANAGEMENT NOTE 03/16/2014  Patient:  Manuel Grant, Manuel Grant   Account Number:  [id number]  Date Initiated:  03/16/2014  Documentation initiated by:  GRAVES-BIGELOW,Renne Cornick  Subjective/Objective Assessment:   Pt admitted for anemia- low Hgb 7.3. S/p transfusion.     Action/Plan:   CM will continue to monitor for disposition needs.   Anticipated DC Date:  03/18/2014   Anticipated DC Plan:  [place]  CM consult      Choice offered to / List presented to:             Status of service:  In process, will continue to follow Medicare Important Message given?   (If response is "NO", the following Medicare IM given date fields will be blank) Date Medicare IM given:   Date Additional Medicare IM given:    Discharge Disposition:    Per UR Regulation:  Reviewed for med. necessity/level of care/duration of stay  If discussed at [place] of Stay Meetings, dates discussed:    Comments:

## 2014-03-16 NOTE — Op Note (Signed)
[facility] [place], [address]   ENDOSCOPY PROCEDURE REPORT  PATIENT: Rourke, Mcquitty  MR#: [id number] BIRTHDATE: [birth year] , 68  yrs. old GENDER: Male ENDOSCOPIST: Jerene Bears, MD PROCEDURE DATE:  03/16/2014 PROCEDURE:  EGD w/ biopsy ASA CLASS:     Class III INDICATIONS:  Iron deficiency anemia. MEDICATIONS: Versed 6 mg IV and Fentanyl 62.5 mcg IV TOPICAL ANESTHETIC: Cetacaine Spray  DESCRIPTION OF PROCEDURE: After the risks benefits and alternatives of the procedure were thoroughly explained, informed consent was obtained.  The PENTAX GASTOROSCOPE [id number] endoscope was introduced through the mouth and advanced to the second portion of the duodenum. Without limitations.  The instrument was slowly withdrawn as the mucosa was fully examined.    ESOPHAGUS: There was mild reflux esophagitis at the GE junction. The mucosa of the esophagus otherwise appeared normal.  STOMACH: Mild erosive gastritis (inflammation) was found in the gastric body and gastric antrum.  Biopsies were taken in the antrum and angularis.  DUODENUM: Two small non-bleeding shallow and clean-based ulcers were found in the duodenal bulb.  Normal-appearing second portion of the duodenum. Biopsies performed to exclude celiac disease.  Retroflexed views revealed no abnormalities.     The scope was then withdrawn from the patient and the procedure completed.  COMPLICATIONS: There were no complications.  ENDOSCOPIC IMPRESSION: 1.   Mild reflux esophagitis.  The mucosa of the esophagus appeared otherwise normal 2.   Mild erosive gastritis (inflammation) was found in the gastric body and gastric antrum; biopsies were taken in the antrum and angularis 3.   Two small non-bleeding ulcers were found in the duodenal bulb; normal D2; biopsies  RECOMMENDATIONS: 1.  Await biopsy results 2.  Begin pantoprazole 40 mg daily (30 minutes before breakfast) eSigned:  Jerene Bears, MD 03/16/2014 1:58 PM CC:The Patient

## 2014-03-17 LAB — TYPE AND SCREEN
ABO/RH(D): B NEG
ANTIBODY SCREEN: NEGATIVE
UNIT DIVISION: 0
Unit division: 0

## 2014-03-17 LAB — GLUCOSE, CAPILLARY
GLUCOSE-CAPILLARY: 123 mg/dL — AB (ref 70–99)
GLUCOSE-CAPILLARY: 157 mg/dL — AB (ref 70–99)
Glucose-Capillary: 106 mg/dL — ABNORMAL HIGH (ref 70–99)
Glucose-Capillary: 165 mg/dL — ABNORMAL HIGH (ref 70–99)

## 2014-03-17 MED ORDER — ISOSORBIDE MONONITRATE ER 60 MG PO TB24
60.0000 mg | ORAL_TABLET | Freq: Every day | ORAL | Status: DC
  Administered 2014-03-17 – 2014-03-18 (×2): 60 mg via ORAL
  Filled 2014-03-17 (×2): qty 1

## 2014-03-17 NOTE — Progress Notes (Signed)
Subjective:  No recurrent chest pain..  GI help appreciated  Objective:  Vital Signs in the last 24 hours: BP 113/56  Pulse 72  Temp(Src) 98.7 F (37.1 C) (Oral)  Resp 18  Ht 5' 7"  (1.702 m)  Wt 96.888 kg (213 lb 9.6 oz)  BMI 33.45 kg/m2  SpO2 95%  Physical Exam: Pleasant WM in NAD Lungs:  Clear  Cardiac:  Regular rhythm, normal S1 and S2, no S3 Abdomen:  Soft, nontender, no masses Extremities:  No edema present  Intake/Output from previous day: 03/20 0701 - 03/21 0700 In: 500 [P.O.:240; IV Piggyback:260] Out: 1 [Urine:1] Weight Filed Weights   03/15/14 0953 03/17/14 0452  Weight: 97.07 kg (214 lb) 96.888 kg (213 lb 9.6 oz)    Lab Results: Basic Metabolic Panel:   CBC:  Recent Labs  03/15/14 1030 03/16/14 0523  WBC 5.7 7.6  HGB 7.3* 9.5*  HCT 25.6* 32.0*  MCV 62.7* 64.4*  PLT 255 261   Telemetry: sinus  Assessment/Plan:  1. Iron deficiency anemia with ulcers noted 2. CAD with angina no additional noted since admit  Rec:  Walk in hall today.  Currently off of antiplatelet for ulcer.  If no more angina, may be best to d/c in am and bring back for cath once ulcer healed as intervention will be difficult in setting of ulcer.    Kerry Hough  MD [facility] Cardiology  03/17/2014, 9:02 AM

## 2014-03-18 LAB — GLUCOSE, CAPILLARY: Glucose-Capillary: 104 mg/dL — ABNORMAL HIGH (ref 70–99)

## 2014-03-18 MED ORDER — METOPROLOL SUCCINATE ER 25 MG PO TB24: 75.0000 mg | ORAL_TABLET | Freq: Every day | ORAL | Status: DC

## 2014-03-18 MED ORDER — PANTOPRAZOLE SODIUM 40 MG PO TBEC: 40.0000 mg | DELAYED_RELEASE_TABLET | Freq: Every day | ORAL | Status: DC

## 2014-03-18 MED ORDER — ISOSORBIDE MONONITRATE ER 60 MG PO TB24: 60.0000 mg | ORAL_TABLET | Freq: Every day | ORAL | Status: DC

## 2014-03-18 NOTE — Discharge Instructions (Signed)
Acute Coronary Syndrome Acute coronary syndrome (ACS) is an urgent problem in which the blood and oxygen supply to the heart is critically deficient. ACS requires hospitalization because one or more coronary arteries may be blocked. ACS represents a range of conditions including:  Previous angina that is now unstable, lasts longer, happens at rest, or is more intense.  A heart attack, with heart muscle cell injury and death. There are three vital coronary arteries that supply the heart muscle with blood and oxygen so that it can pump blood effectively. If blockages to these arteries develop, blood flow to the heart muscle is reduced. If the heart does not get enough blood, angina may occur as the first warning sign. SYMPTOMS   The most common signs of angina include:  Tightness or squeezing in the chest.  Feeling of heaviness on the chest.  Discomfort in the arms, neck, or jaw.  Shortness of breath and nausea.  Cold, wet skin.  Angina is usually brought on by physical effort or excitement which increase the oxygen needs of the heart. These states increase the blood flow needs of the heart beyond what can be delivered. TREATMENT   Medicines to help discomfort may include nitroglycerin (nitro) in the form of tablets or a spray for rapid relief, or longer-acting forms such as cream, patches, or capsules. (Be aware that there are many side effects and possible interactions with other drugs).  Other medicines may be used to help the heart pump better.  Procedures to open blocked arteries including angioplasty or stent placement to keep the arteries open.  Open heart surgery may be needed when there are many blockages or they are in critical locations that are best treated with surgery. HOME CARE INSTRUCTIONS   Avoid smoking.  Take one baby or adult aspirin daily, if your caregiver advises. This helps reduce the risk of a heart attack.  It is very important that you follow the angina  treatment prescribed by your caregiver. Make arrangements for proper follow-up care.  Eat a heart healthy diet with salt and fat restrictions as advised.  Regular exercise is good for you as long as it does not cause discomfort. Do not begin any new type of exercise until you check with your caregiver.  If you are overweight, you should lose weight.  Try to maintain normal blood lipid levels.  Keep your blood pressure under control as recommended by your caregiver.  You should tell your caregiver right away about any increase in the severity or frequency of your chest discomfort or angina attacks. When you have angina, you should stop what you are doing and sit down. This may bring relief in 3 to 5 minutes. If your caregiver has prescribed nitro, take it as directed.  If your caregiver has given you a follow-up appointment, it is very important to keep that appointment. Not keeping the appointment could result in a chronic or permanent injury, pain, and disability. If there is any problem keeping the appointment, you must call back to this facility for assistance. SEEK IMMEDIATE MEDICAL CARE IF:   You develop nausea, vomiting, or shortness of breath.  You feel faint, lightheaded, or pass out.  Your chest discomfort gets worse.  You are sweating or experience sudden profound fatigue.  You do not get relief of your chest pain after 3 doses of nitro.  Your discomfort lasts longer than 15 minutes. MAKE SURE YOU:   Understand these instructions.  Will watch your condition.  Will get help  right away if you are not doing well or get worse. Document Released: 12/14/2005 Document Revised: 03/07/2012 Document Reviewed: 07/17/2008 [facility] Patient Information 2014 [place].

## 2014-03-18 NOTE — Progress Notes (Signed)
Subjective:  No recurrent chest pain.  Walked in hall yesterday.    Objective:  Vital Signs in the last 24 hours: BP 148/79  Pulse 100  Temp(Src) 98.9 F (37.2 C) (Oral)  Resp 16  Ht 5' 7"  (1.702 m)  Wt 96.888 kg (213 lb 9.6 oz)  BMI 33.45 kg/m2  SpO2 98%  Physical Exam: Pleasant WM in NAD Lungs:  Clear  Cardiac:  Regular rhythm, normal S1 and S2, no S3 Abdomen:  Soft, nontender, no masses Extremities:  No edema present  Intake/Output from previous day: 03/21 0701 - 03/22 0700 In: 480 [P.O.:480] Out: -  Weight Filed Weights   03/15/14 0953 03/17/14 0452  Weight: 97.07 kg (214 lb) 96.888 kg (213 lb 9.6 oz)    Lab Results: Basic Metabolic Panel:   CBC:  Recent Labs  03/16/14 0523  WBC 7.6  HGB 9.5*  HCT 32.0*  MCV 64.4*  PLT 261   Telemetry: sinus  Assessment/Plan:  1. Iron deficiency anemia with ulcers noted 2. CAD with angina no additional noted since admit  Rec:    Currently off of antiplatelet for ulcer. Discharge today and bring back for cath once ulcer healed as intervention will be difficult in setting of ulcer.  Should go home on no aspirin and daily PPI.  Followup with Dr. Gwenlyn Found this week.    Kerry Hough  MD [facility] Cardiology  03/18/2014, 10:53 AM

## 2014-03-18 NOTE — Discharge Summary (Signed)
Physician Discharge Summary  Patient ID: Manuel Grant MRN: [id number] DOB/AGE: 1946-03-22 68 y.o.  Primary Cardiologist: Dr. Gwenlyn Found   Admit date: 03/15/2014 Discharge date: 03/18/2014  Admission Diagnoses: Microcystic Hypochromic Anemia  Discharge Diagnoses:  Principal Problem:   Microcytic hypochromic anemia Active Problems:   Diabetes mellitus type 2 with neurological manifestations   Essential hypertension, benign   Pure hypercholesterolemia   CAD (coronary artery disease)   B12 deficiency   Unstable angina   Osteoarthritis   Duodenal ulcer disease   Discharged Condition: stable  Hospital Course: 68 y/o male with a prior h/o CAD, s/p cath in 1999 revealing moderate-severe multivessel CAD. He was medically managed and has done well over the years with a neg MV in 07/2010. He also has a h/o htn, hl, dm, and ulcerative colitis, s/p remote colectomy at [facility] with placement of a J pouch. He was in his usoh until about 2-3 mos ago when he began to experience reduced exercise tolerance with progressive dyspnea on exertion, profound exertional wkns, and exertional angina. His family noted that he would also become pale. Over the past week, Ss have been occurring with minimal exertion and so he was seen in cardiology clinic on 3/12. Given progressive Ss, arrangements were made for diagnostic cath. Pt had labs drawn on 3/16 revealing and H/H of 7.5/27.2, MCV of 61.4, and MCHC of 27.6. Coags were nl.   He presented to [facility] on 3/19 for cath and repeat CBC corroborated previous values. As a result, cath was cancelled and he was admitted for GI w/u. He denied any BRBPR or melena. He reported taking mobic and asa at home but denied any use of any other NSAIDs.  GI was consulted. He was transfused. Hgb improved to 9.5. He was also placed on IV iron. An EGD and flex sigmoidoscopy/ileoscopy were ordered. The procedures were performed by Dr. Hilarie Fredrickson. The EGD revealed 2 small non bleeding ulcers in  the duodenal bulb. The sigmoidoscopy/ileoscopy revealed one small non bleeding, shallow, cleaned based ulcer. There was also an anal polyp which was biopsied. Results pending. Daily Protonix was recommended.  The recommendation was given to avoid all NSAIDs, including ASA. His Mobic was discontinued on discharge.  The patient had no further CP with ambulation, and it was felt that his chest pain was likely subsequent to his anemia. It was felt best to discharge home and bring back for a cath, once his ulcers healed, as intervention would be difficult in the setting of GI ulcers. He was last seen and examined by Dr. Wynonia Lawman, who determined he was stable for discharge home. GI also felt that he was stable.  He was prescribed 40 mg of Protonix daily. ASA and Mobic were both discontinued. He will f/u in clinic with Dr. Gwenlyn Found in 1 week.   Consults: GI  Significant Diagnostic Studies:  EGD and sigmoidoscopy/ileoscopy  - see complete study reports in procedures tab  Treatments: See Hospital Course  Discharge Exam: Blood pressure 148/79, pulse 100, temperature 98.9 F (37.2 C), temperature source Oral, resp. rate 16, height 5' 7"  (1.702 m), weight 213 lb 9.6 oz (96.888 kg), SpO2 98.00%.   Disposition:       Discharge Orders   Future Orders Complete By Expires   Diet - low sodium heart healthy  As directed    Increase activity slowly  As directed        Medication List    STOP taking these medications       aspirin 81  MG tablet     meloxicam 7.5 MG tablet  Commonly known as:  MOBIC      TAKE these medications       cyanocobalamin 1000 MCG/ML injection  Commonly known as:  (VITAMIN B-12)  Inject 1,000 mcg into the muscle every 30 (thirty) days.     glipiZIDE 10 MG 24 hr tablet  Commonly known as:  GLUCOTROL XL  Take 10 mg by mouth daily.     isosorbide mononitrate 60 MG 24 hr tablet  Commonly known as:  IMDUR  Take 1 tablet (60 mg total) by mouth daily.      lisinopril-hydrochlorothiazide 20-12.5 MG per tablet  Commonly known as:  PRINZIDE,ZESTORETIC  Take 2 tablets by mouth daily.     lovastatin 40 MG tablet  Commonly known as:  MEVACOR  Take 40 mg by mouth at bedtime.     metoprolol succinate 25 MG 24 hr tablet  Commonly known as:  TOPROL-XL  Take 3 tablets (75 mg total) by mouth daily.     NEEDLE (DISP) 25 G 25G X 5/8" Misc  Commonly known as:  BD DISP NEEDLES  25 g by Does not apply route every 30 (thirty) days.     nitroGLYCERIN 0.4 MG SL tablet  Commonly known as:  NITROSTAT  Place 0.4 mg under the tongue every 5 (five) minutes as needed for chest pain.     NON FORMULARY  Take 1 tablet by mouth daily. Nattokinase Complex one tab daily     NON FORMULARY  Take 12 mg by mouth daily. Astaxanthin  One 12 mg tablet daily for skin     NON FORMULARY  Take 1 tablet by mouth daily. Diabetes Multi Vitamin and Minerals one tablet daily     pantoprazole 40 MG tablet  Commonly known as:  PROTONIX  Take 1 tablet (40 mg total) by mouth daily.     Syringe (Disposable) 1 ML Misc  1 mL by Does not apply route every 30 (thirty) days.     traMADol 50 MG tablet  Commonly known as:  ULTRAM  Take 50 mg by mouth 3 (three) times daily. Take 2 tablets by mouth three times daily.     zolpidem 10 MG tablet  Commonly known as:  AMBIEN  Take 10 mg by mouth at bedtime.       Follow-up Information   Follow up with [name], MD. (our office will call you with an appointment)    Specialty:  Cardiology   Contact information:   [address] [place] [address] [phone]      TIME SPENT ON DISCHARGE, INCLUDING PHYSICIAN TIME: > 30 MINUTES  Signed: SIMMONS, BRITTAINY 03/18/2014, 11:34 AM

## 2014-03-18 NOTE — Progress Notes (Signed)
DC orders received.  Patient stable with no S/S of distress.  Medication and discharge information reviewed with patient and patient's wife.  Patient DC home with wife. Manuel Grant, Ardeth Sportsman

## 2014-03-18 NOTE — Discharge Summary (Signed)
Agree with discharge summary  W. Tollie Eth, Brooke Bonito MD [facility]

## 2014-03-19 ENCOUNTER — Encounter (HOSPITAL_COMMUNITY): Payer: Self-pay | Admitting: Internal Medicine

## 2014-03-19 ENCOUNTER — Telehealth: Payer: Self-pay | Admitting: *Deleted

## 2014-03-19 ENCOUNTER — Telehealth: Payer: Self-pay | Admitting: Gastroenterology

## 2014-03-19 NOTE — Telephone Encounter (Signed)
Message copied by Annabell Sabal on Mon Mar 19, 2014  7:53 AM ------      Message from: Jerene Bears      Created: Fri Mar 16, 2014  6:48 PM       Pt needs follow-up with me and VSL#3 prescription strength      JMP ------

## 2014-03-21 ENCOUNTER — Other Ambulatory Visit: Payer: Self-pay

## 2014-03-21 ENCOUNTER — Encounter: Payer: Self-pay | Admitting: Internal Medicine

## 2014-03-21 MED ORDER — BIS SUBCIT-METRONID-TETRACYC 140-125-125 MG PO CAPS: 3.0000 | ORAL_CAPSULE | Freq: Three times a day (TID) | ORAL | Status: DC

## 2014-03-21 MED ORDER — VSL#3 PO PACK: 1.0000 | PACK | Freq: Two times a day (BID) | ORAL | Status: DC

## 2014-03-23 ENCOUNTER — Other Ambulatory Visit: Payer: Self-pay | Admitting: Gastroenterology

## 2014-03-23 ENCOUNTER — Telehealth: Payer: Self-pay | Admitting: Gastroenterology

## 2014-03-23 ENCOUNTER — Telehealth: Payer: Self-pay | Admitting: Internal Medicine

## 2014-03-23 MED ORDER — VSL#3 PO CAPS: 1.0000 | ORAL_CAPSULE | Freq: Every day | ORAL | Status: DC

## 2014-03-23 MED ORDER — CLARITHROMYCIN 500 MG PO TABS: ORAL_TABLET | ORAL | Status: DC

## 2014-03-23 MED ORDER — OMEPRAZOLE 20 MG PO CPDR: DELAYED_RELEASE_CAPSULE | ORAL | Status: DC

## 2014-03-23 MED ORDER — METRONIDAZOLE 250 MG PO TABS: ORAL_TABLET | ORAL | Status: DC

## 2014-03-23 NOTE — Telephone Encounter (Signed)
lvm for pt. After talking to [name] PA-C on a contraindication that came up with Biaxin and Lovastatin, she advised me to tell pt to hold her statin for the time he is taking the medications to treat H. Pylori.

## 2014-03-23 NOTE — Telephone Encounter (Signed)
Spoke to pt he said insurance will not cover Pylera, but will cover the 3 medications in pylera. Spoke to Dr. Ardis Hughs (doc of the day) since Dr. Hilarie Fredrickson is out of the office today. He ok'd Flagyl 250 mg QID; Biaxin 500 mg BID, and Omeprazole 20 mg BID. Told pt I will send those in. Pt verbalized understanding

## 2014-03-27 ENCOUNTER — Ambulatory Visit (INDEPENDENT_AMBULATORY_CARE_PROVIDER_SITE_OTHER): Payer: Commercial Managed Care - HMO | Admitting: Cardiology

## 2014-03-27 ENCOUNTER — Encounter: Payer: Self-pay | Admitting: Cardiology

## 2014-03-27 VITALS — BP 118/82 | HR 104 | Ht 68.0 in | Wt 204.9 lb

## 2014-03-27 DIAGNOSIS — M1990 Unspecified osteoarthritis, unspecified site: Secondary | ICD-10-CM

## 2014-03-27 DIAGNOSIS — R5383 Other fatigue: Secondary | ICD-10-CM

## 2014-03-27 DIAGNOSIS — R5381 Other malaise: Secondary | ICD-10-CM

## 2014-03-27 DIAGNOSIS — D509 Iron deficiency anemia, unspecified: Secondary | ICD-10-CM

## 2014-03-27 DIAGNOSIS — I200 Unstable angina: Secondary | ICD-10-CM

## 2014-03-27 LAB — CBC
HEMATOCRIT: 36.8 % — AB (ref 39.0–52.0)
Hemoglobin: 11.1 g/dL — ABNORMAL LOW (ref 13.0–17.0)
MCH: 20.1 pg — ABNORMAL LOW (ref 26.0–34.0)
MCHC: 30.2 g/dL (ref 30.0–36.0)
MCV: 66.7 fL — ABNORMAL LOW (ref 78.0–100.0)
Platelets: 278 10*3/uL (ref 150–400)
RBC: 5.52 MIL/uL (ref 4.22–5.81)
RDW: 27.8 % — AB (ref 11.5–15.5)
WBC: 11.9 10*3/uL — AB (ref 4.0–10.5)

## 2014-03-27 NOTE — Patient Instructions (Signed)
Your physician recommends that you return for lab work in: [place].   Your physician recommends that you schedule a follow-up appointment in: 2 months with Dr. Gwenlyn Found

## 2014-03-29 ENCOUNTER — Emergency Department (INDEPENDENT_AMBULATORY_CARE_PROVIDER_SITE_OTHER): Payer: Commercial Managed Care - HMO

## 2014-03-29 ENCOUNTER — Telehealth: Payer: Self-pay | Admitting: Family Medicine

## 2014-03-29 ENCOUNTER — Emergency Department (HOSPITAL_COMMUNITY)
Admission: EM | Admit: 2014-03-29 | Discharge: 2014-03-29 | Disposition: A | Discharge status: Home / Self Care | Payer: Commercial Managed Care - HMO | Source: Home / Self Care | Attending: Family Medicine | Admitting: Family Medicine

## 2014-03-29 ENCOUNTER — Encounter (HOSPITAL_COMMUNITY): Payer: Self-pay | Admitting: Emergency Medicine

## 2014-03-29 DIAGNOSIS — M5030 Other cervical disc degeneration, unspecified cervical region: Secondary | ICD-10-CM

## 2014-03-29 MED ORDER — CYCLOBENZAPRINE HCL 5 MG PO TABS: 5.0000 mg | ORAL_TABLET | Freq: Three times a day (TID) | ORAL | Status: DC | PRN

## 2014-03-29 MED ORDER — TRAMADOL HCL 50 MG PO TABS: 50.0000 mg | ORAL_TABLET | Freq: Four times a day (QID) | ORAL | Status: DC | PRN

## 2014-03-29 MED ORDER — KETOROLAC TROMETHAMINE 30 MG/ML IJ SOLN
INTRAMUSCULAR | Status: AC
  Filled 2014-03-29: qty 1

## 2014-03-29 MED ORDER — KETOROLAC TROMETHAMINE 30 MG/ML IJ SOLN
30.0000 mg | Freq: Once | INTRAMUSCULAR | Status: AC
  Administered 2014-03-29: 30 mg via INTRAMUSCULAR

## 2014-03-29 NOTE — Telephone Encounter (Signed)
Patient Information:  Caller Name: Manuel Grant  Phone: [phone]  Patient: Manuel Grant, Manuel Grant  Gender: Male  DOB: 1946/07/19  Age: 68 Years  PCP: Manuel Grant) [facility])  Office Follow Up:  Does the office need to follow up with this patient?: No  Instructions For The Office: N/A  RN Note:  will use [facility] or hospital  Symptoms  Reason For Call & Symptoms: c/o back pain since yesterday; rode the lawn mower for 1.5hrs yesterday and was looking back because he was pulling a trailer to dispense fertilizer; hard to get up and move around; needs assistance; Manuel Grant is also concerned that BP is 99/67; face flushed; also c/o severe neck pain and left leg pain  Reviewed Health History In EMR: Yes  Reviewed Medications In EMR: Yes  Reviewed Allergies In EMR: Yes  Reviewed Surgeries / Procedures: Yes  Date of Onset of Symptoms: 03/28/2014  Treatments Tried: Tramadol  Treatments Tried Worked: No  Guideline(s) Used:  Back Pain  Neck Pain or Stiffness  Disposition Per Guideline:   Go to Office Now  Reason For Disposition Reached:   Severe pain (e.g., excruciating, unable to do any normal activities)  Advice Given:  N/A  RN Overrode Recommendation:  Go To [facility]  will use UC or ED since almost 1700

## 2014-03-29 NOTE — Discharge Instructions (Signed)
Wear collar for comfort, medications as needed, see your doctor if further problems.

## 2014-03-29 NOTE — Telephone Encounter (Signed)
Agree with ER or UC.

## 2014-03-29 NOTE — ED Provider Notes (Signed)
CSN: [id number]     Arrival date & time 03/29/14  1733 History   First MD Initiated Contact with Patient 03/29/14 [place]     Chief Complaint  Patient presents with  . Back Pain   (Consider location/radiation/quality/duration/timing/severity/associated sxs/prior Treatment) Patient is a 68 y.o. male presenting with neck injury. The history is provided by the patient and the spouse.  Neck Injury This is a new problem. The current episode started more than 2 days ago (spreading fertilizer in yard with repetetive head turning.). The problem has not changed since onset.Pertinent negatives include no chest pain, no abdominal pain and no headaches.    Past Medical History  Diagnosis Date  . Ulcerative colitis     s/p colectomy ~ 2005 [facility]  . Renal stones     uric acid stones  . Chronic kidney disease   . Hypertension   . Hyperlipidemia   . Rosacea   . Insomnia   . Hiatal hernia   . Arthritis     Knee OA- per [facility]- injected prev by ortho  . Coronary artery disease     a. 10/1998 Cath: LM 30d, LAD 44m OM 50, RCA 100 w/ L->R collats, nl LV ->Med Rx;  b. 07/2010 MV: no ischemia.  .[name] KitchenB12 deficiency   . Osteoarthritis   . Type II diabetes mellitus   . Myocardial infarction 1999    "heart occasion" (03/15/2014)  . Pneumonia 1970's  . Anemia    Past Surgical History  Procedure Laterality Date  . Subtotal [facility]omy  2005    [facility] . Knee arthroscopy Bilateral 1990's  . Hernia repair Right ~ 2006    "abdomen; was as a result of a surgery"  . Cardiac catheterization  11/15/1998  . Esophagogastroduodenoscopy N/A 03/16/2014    Procedure: ESOPHAGOGASTRODUODENOSCOPY (EGD);  Surgeon: JJerene Bears MD;  [facility];  Service: Endoscopy;  Laterality: N/A;  . Flexible sigmoidoscopy N/A 03/16/2014    Procedure: FLEXIBLE SIGMOIDOSCOPY;  Surgeon: JJerene Bears MD;  [facility];  Service: Endoscopy;  Laterality: N/A;   Family History  Problem  Relation Age of Onset  . Heart disease Mother   . Diabetes Mother   . COPD Father   . Colon cancer Neg Hx   . Prostate cancer Neg Hx    History  Substance Use Topics  . Smoking status: Former Smoker -- 2.00 packs/day for 10 years    Types: Cigarettes    Quit date: 12/28/1974  . Smokeless tobacco: Never Used  . Alcohol Use: Yes     Comment: 03/15/2014 "6 pack beer or less/yr"    Review of Systems  Constitutional: Negative.   Cardiovascular: Negative for chest pain.  Gastrointestinal: Negative for abdominal pain.  Musculoskeletal: Positive for neck pain and neck stiffness.  Neurological: Negative for weakness, numbness and headaches.    Allergies  Review of patient's allergies indicates no known allergies.  Home Medications   Current Outpatient Rx  Name  Route  Sig  Dispense  Refill  . clarithromycin (BIAXIN) 500 MG tablet      Take 1 tablet by mouth twice a day for 14 days   28 tablet   0   . cyanocobalamin (,VITAMIN B-12,) 1000 MCG/ML injection   Intramuscular   Inject 1,000 mcg into the muscle every 30 (thirty) days.         . cyclobenzaprine (FLEXERIL) 5 MG tablet   Oral   Take 1 tablet (  5 mg total) by mouth 3 (three) times daily as needed for muscle spasms.   30 tablet   0   . Dietary Management Product (VSL#3) PACK   Oral   Take 1 each by mouth 2 (two) times daily.   60 each   0   . glipiZIDE (GLUCOTROL XL) 10 MG 24 hr tablet   Oral   Take 10 mg by mouth daily.         . isosorbide mononitrate (IMDUR) 60 MG 24 hr tablet   Oral   Take 1 tablet (60 mg total) by mouth daily.   30 tablet   5   . lisinopril-hydrochlorothiazide (PRINZIDE,ZESTORETIC) 20-12.5 MG per tablet   Oral   Take 2 tablets by mouth daily.         . metFORMIN (GLUCOPHAGE) 1000 MG tablet   Oral   Take 1,000 mg by mouth 2 (two) times daily.         . metoprolol succinate (TOPROL-XL) 25 MG 24 hr tablet   Oral   Take 3 tablets (75 mg total) by mouth daily.   90 tablet    5   . metroNIDAZOLE (FLAGYL) 250 MG tablet      Take 1 tablet by mouth four times a day for 14 days   56 tablet   0   . minocycline (MINOCIN,DYNACIN) 50 MG capsule   Oral   Take 50 mg by mouth daily.         [name] NEEDLE, DISP, 25 G (BD DISP NEEDLES) 25G X 5/8" MISC   Does not apply   25 g by Does not apply route every 30 (thirty) days.   12 each   0   . nitroGLYCERIN (NITROSTAT) 0.4 MG SL tablet   Sublingual   Place 0.4 mg under the tongue every 5 (five) minutes as needed for chest pain.         . NON FORMULARY   Oral   Take 1 tablet by mouth daily. Nattokinase Complex one tab daily         . NON FORMULARY   Oral   Take 12 mg by mouth daily. Astaxanthin  One 12 mg tablet daily for skin         . NON FORMULARY   Oral   Take 1 tablet by mouth daily. Diabetes Multi Vitamin and Minerals one tablet daily         . omeprazole (PRILOSEC) 20 MG capsule      Take 1 tablet by mouth twice a day for 14 days   28 capsule   0   . OVER THE COUNTER MEDICATION   Topical   Apply topically daily as needed.         . pantoprazole (PROTONIX) 40 MG tablet   Oral   Take 1 tablet (40 mg total) by mouth daily.   30 tablet   5   . Probiotic Product (VSL#3) CAPS   Oral   Take 1 capsule by mouth daily.   30 capsule   6   . Syringe, Disposable, 1 ML MISC   Does not apply   1 mL by Does not apply route every 30 (thirty) days.   12 each   0   . traMADol (ULTRAM) 50 MG tablet   Oral   Take 50 mg by mouth 3 (three) times daily. Take 2 tablets by mouth three times daily.         . traMADol (ULTRAM) 50 MG  tablet   Oral   Take 1 tablet (50 mg total) by mouth every 6 (six) hours as needed. For neck pain   20 tablet   0   . zolpidem (AMBIEN) 10 MG tablet   Oral   Take 10 mg by mouth at bedtime.          BP 104/70  Pulse 108  Temp(Src) 98.6 F (37 C) (Oral)  Resp 18  SpO2 97% Physical Exam  Nursing note and vitals reviewed. Constitutional: He is oriented to  person, place, and time. He appears well-developed and well-nourished. He appears distressed.  Neck: Trachea normal. Muscular tenderness present. Carotid bruit is not present. Rigidity present. Decreased range of motion present. No Brudzinski's sign and no Kernig's sign noted.  Musculoskeletal: He exhibits tenderness.  Lymphadenopathy:    He has no cervical adenopathy.  Neurological: He is alert and oriented to person, place, and time.  Skin: Skin is warm and dry.    ED Course  Procedures (including critical care time) Labs Review Labs Reviewed - No data to display Imaging Review Dg Cervical Spine Complete  03/29/2014   CLINICAL DATA:  Posterior neck pain radiating into both shoulders.  EXAM: CERVICAL SPINE  4+ VIEWS  COMPARISON:  None.  FINDINGS: There is moderate degenerative disc disease at C5-6 and C6-7 and C7-T1. There is moderate left facet arthritis at C4-5. There is slight left foraminal stenosis at C5-6.  No fracture or subluxation or prevertebral soft tissue swelling. Small old benign calcifications in the nuchal ligament at C5-6.  IMPRESSION: No acute abnormality. Degenerative disc disease in the lower cervical spine.   Electronically Signed   By: Rozetta Nunnery M.D.   On: 03/29/2014 19:51   X-rays reviewed and report per radiologist.   MDM   1. Degenerative disc disease, cervical        Billy Fischer, MD 03/29/14 2018

## 2014-03-30 ENCOUNTER — Other Ambulatory Visit: Payer: Self-pay | Admitting: Family Medicine

## 2014-03-31 ENCOUNTER — Encounter: Payer: Self-pay | Admitting: Cardiology

## 2014-03-31 NOTE — Progress Notes (Signed)
Patient ID: Manuel Grant, male   DOB: October 29, 1946, 68 y.o.   MRN: [id number]    03/31/2014 Manuel Grant   1946/09/10  [id number]  Primary Physicia Manuel Stain, MD Primary Cardiologist: Dr. Gwenlyn Grant   Mr. Manuel Grant presents to clinic for post hospital follow up after an admission for unstable angina, in the setting of severe anemia.   HPI: The patient is a 68 y/o male, followed by Dr. Gwenlyn Grant, with a prior h/o CAD, s/p cath in 1999 revealing moderate-severe multivessel CAD. He was medically managed and has done well over the years with a neg MV in 07/2010. He also has a h/o htn, hl, dm, and ulcerative colitis, s/p remote colectomy at [facility] with placement of a J pouch. He was in his usoh until about 2-3 mos ago when he began to experience reduced exercise tolerance with progressive dyspnea on exertion, profound exertional wkns, and exertional angina. His family noted that he would also become pale. Over the past week, Ss have been occurring with minimal exertion and so he was seen in cardiology clinic on 3/12. Given progressive Ss, arrangements were made for diagnostic cath. Pt had labs drawn on 3/16 revealing and H/H of 7.5/27.2, MCV of 61.4, and MCHC of 27.6. Coags were nl.   He presented to [facility] on 3/19 for cath and repeat CBC corroborated previous values. As a result, cath was cancelled and he was admitted for GI w/u. He denied any BRBPR or melena. He reported taking mobic and asa at home but denied any use of any other NSAIDs.  GI was consulted. He was transfused. Hgb improved to 9.5. He was also placed on IV iron. An EGD and flex sigmoidoscopy/ileoscopy were ordered. The procedures were performed by Dr. Hilarie Grant. The EGD revealed 2 small non bleeding ulcers in the duodenal bulb. The sigmoidoscopy/ileoscopy revealed one small non bleeding, shallow, cleaned based ulcer. There was also an anal polyp which was biopsied. Results pending. Daily Protonix was recommended. The recommendation was given to  avoid all NSAIDs, including ASA. His Mobic was discontinued on discharge.   The patient had no further CP with ambulation, and it was felt that his chest pain was likely subsequent to his anemia. It was felt best to discharge home and bring back for a cath, once his ulcers healed, as intervention would be difficult in the setting of GI ulcers. He was last seen and examined by Dr. Wynonia Grant, who determined he was stable for discharge home. GI also felt that he was stable.   He was prescribed 40 mg of Protonix daily. ASA and Mobic were both discontinued.  He presents to clinic today for post hospital f/u. He is accompanied by his wife. He reports that he has been doing well. He denies any further recurrence in chest pain and no SOB. His exercise tolerance has improved. He was able to perform a substantial amount of yard work the other day without limitation. He has been compliant with daily use of PPI and has refrained from use of all NSAIDS.    Current Outpatient Prescriptions  Medication Sig Dispense Refill  . clarithromycin (BIAXIN) 500 MG tablet Take 1 tablet by mouth twice a day for 14 days  28 tablet  0  . cyanocobalamin (,VITAMIN B-12,) 1000 MCG/ML injection Inject 1,000 mcg into the muscle every 30 (thirty) days.      . Dietary Management Product (VSL#3) PACK Take 1 each by mouth 2 (two) times daily.  60 each  0  . glipiZIDE (  GLUCOTROL XL) 10 MG 24 hr tablet Take 10 mg by mouth daily.      . isosorbide mononitrate (IMDUR) 60 MG 24 hr tablet Take 1 tablet (60 mg total) by mouth daily.  30 tablet  5  . lisinopril-hydrochlorothiazide (PRINZIDE,ZESTORETIC) 20-12.5 MG per tablet Take 2 tablets by mouth daily.      . metFORMIN (GLUCOPHAGE) 1000 MG tablet Take 1,000 mg by mouth 2 (two) times daily.      . metoprolol succinate (TOPROL-XL) 25 MG 24 hr tablet Take 3 tablets (75 mg total) by mouth daily.  90 tablet  5  . metroNIDAZOLE (FLAGYL) 250 MG tablet Take 1 tablet by mouth four times a day for 14  days  56 tablet  0  . minocycline (MINOCIN,DYNACIN) 50 MG capsule Take 50 mg by mouth daily.      [name] NEEDLE, DISP, 25 G (BD DISP NEEDLES) 25G X 5/8" MISC 25 g by Does not apply route every 30 (thirty) days.  12 each  0  . nitroGLYCERIN (NITROSTAT) 0.4 MG SL tablet Place 0.4 mg under the tongue every 5 (five) minutes as needed for chest pain.      . NON FORMULARY Take 1 tablet by mouth daily. Nattokinase Complex one tab daily      . NON FORMULARY Take 12 mg by mouth daily. Astaxanthin  One 12 mg tablet daily for skin      . NON FORMULARY Take 1 tablet by mouth daily. Diabetes Multi Vitamin and Minerals one tablet daily      . omeprazole (PRILOSEC) 20 MG capsule Take 1 tablet by mouth twice a day for 14 days  28 capsule  0  . OVER THE COUNTER MEDICATION Apply topically daily as needed.      . pantoprazole (PROTONIX) 40 MG tablet Take 1 tablet (40 mg total) by mouth daily.  30 tablet  5  . Probiotic Product (VSL#3) CAPS Take 1 capsule by mouth daily.  30 capsule  6  . Syringe, Disposable, 1 ML MISC 1 mL by Does not apply route every 30 (thirty) days.  12 each  0  . traMADol (ULTRAM) 50 MG tablet Take 50 mg by mouth 3 (three) times daily. Take 2 tablets by mouth three times daily.      [name] zolpidem (AMBIEN) 10 MG tablet Take 10 mg by mouth at bedtime.      . cyclobenzaprine (FLEXERIL) 5 MG tablet Take 1 tablet (5 mg total) by mouth 3 (three) times daily as needed for muscle spasms.  30 tablet  0  . traMADol (ULTRAM) 50 MG tablet Take 1 tablet (50 mg total) by mouth every 6 (six) hours as needed. For neck pain  20 tablet  0   No current facility-administered medications for this visit.    No Known Allergies  History   Social History  . Marital Status: Married    Spouse Name: N/A    Number of Children: N/A  . Years of Education: N/A   Occupational History  . Not on file.   Social History Main Topics  . Smoking status: Former Smoker -- 2.00 packs/day for 10 years    Types: Cigarettes     Quit date: 12/28/1974  . Smokeless tobacco: Never Used  . Alcohol Use: Yes     Comment: 03/15/2014 "6 pack beer or less/yr"  . Drug Use: No  . Sexual Activity: Not Currently   Other Topics Concern  . Not on file   Social History Narrative  Married 1967   Retired from [employer] work   Lives in [place] with his wife.     Review of Systems: General: negative for chills, fever, night sweats or weight changes.  Cardiovascular: negative for chest pain, dyspnea on exertion, edema, orthopnea, palpitations, paroxysmal nocturnal dyspnea or shortness of breath Dermatological: negative for rash Respiratory: negative for cough or wheezing Urologic: negative for hematuria Abdominal: negative for nausea, vomiting, diarrhea, bright red blood per rectum, melena, or hematemesis Neurologic: negative for visual changes, syncope, or dizziness All other systems reviewed and are otherwise negative except as noted above.    Blood pressure 118/82, pulse 104, height 5' 8"  (1.727 m), weight 204 lb 14.4 oz (92.942 kg).  General appearance: alert, cooperative and no distress Neck: no carotid bruit and no JVD Lungs: clear to auscultation bilaterally Heart: regular rate and rhythm, S1, S2 normal, no murmur, click, rub or gallop Extremities: no LEE Pulses: 2+ and symmetric Skin: warm and dry  Neurologic: Grossly normal   ASSESSMENT AND PLAN:   Unstable angina Symptoms have resolved with treatment of anemia. I suspect this was largely the cause. He denies any recurrent chest pain and SOB. His exercise tolerance has also improved. If he develops any further recurrence in symptoms, and if he is not Grant to be anemic, then would recommend pursing a cardiac eval (NST vs cath).   Microcytic hypochromic anemia Pt reports remarkable improvement in symptoms.  He denies hematochezia, melena, SOB, chest pain, fatigue, dizziness, syncope, near syncope. Hgb on discharge was 9.5. Will recheck CBC to trend levels.  Continue PPI and NSAID avoidance, including ASA use for now.   Osteoarthritis Pt has concerns being off of Meloxicam and all other NSAIDS, in the setting of multi joint arthritis. I explained to him the correlation between chronic NSAID use and increase risk for PUD/ GI irritation. I recommended acetaminophen as a substitute. I also recommended that he discuss steroid and hyaluronic acid injections with his orthopedist as an alternative treatment for his knee and shoulder arthritis. If these alternatives do not adequately treat his arthritic pain, then he will need to discuss with Dr. Hilarie Grant the idea of restating Meloxicam at the low dose of 7.5 mg, now that he is on chronic PPI therapy.     PLAN  Continue plan as stated above. CBC ordered for today. F/U with Dr. Gwenlyn Grant in 4-6 weeks.   Janathan Bribiesca, BRITTAINYPA-C 03/31/2014 12:19 PM

## 2014-03-31 NOTE — Assessment & Plan Note (Signed)
Symptoms have resolved with treatment of anemia. I suspect this was largely the cause. He denies any recurrent chest pain and SOB. His exercise tolerance has also improved. If he develops any further recurrence in symptoms, and if he is not found to be anemic, then would recommend pursing a cardiac eval (NST vs cath).

## 2014-03-31 NOTE — Assessment & Plan Note (Signed)
Pt has concerns being off of Meloxicam and all other NSAIDS, in the setting of multi joint arthritis. I explained to him the correlation between chronic NSAID use and increase risk for PUD/ GI irritation. I recommended acetaminophen as a substitute. I also recommended that he discuss steroid and hyaluronic acid injections with his orthopedist as an alternative treatment for his knee and shoulder arthritis. If these alternatives do not adequately treat his arthritic pain, then he will need to discuss with Dr. Hilarie Fredrickson the idea of restating Meloxicam at the low dose of 7.5 mg, now that he is on chronic PPI therapy.

## 2014-03-31 NOTE — Assessment & Plan Note (Addendum)
Pt reports remarkable improvement in symptoms.  He denies hematochezia, melena, SOB, chest pain, fatigue, dizziness, syncope, near syncope. Hgb on discharge was 9.5. Will recheck CBC to trend levels. Continue PPI and NSAID avoidance, including ASA use for now.

## 2014-04-02 NOTE — Telephone Encounter (Signed)
Electronic refill request. Last OV 09/07/13.  No history of last fill.  Please advise.

## 2014-04-02 NOTE — Telephone Encounter (Signed)
Medication phoned to pharmacy.  

## 2014-04-02 NOTE — Telephone Encounter (Signed)
plz phone in. 

## 2014-04-04 ENCOUNTER — Other Ambulatory Visit: Payer: Self-pay | Admitting: Family Medicine

## 2014-04-04 ENCOUNTER — Encounter: Payer: Self-pay | Admitting: Internal Medicine

## 2014-04-05 ENCOUNTER — Emergency Department (HOSPITAL_COMMUNITY)
Admission: EM | Admit: 2014-04-05 | Discharge: 2014-04-05 | Disposition: A | Discharge status: Nursing Home (Includes ICF) | Payer: Commercial Managed Care - HMO | Source: Home / Self Care | Attending: Emergency Medicine | Admitting: Emergency Medicine

## 2014-04-05 ENCOUNTER — Emergency Department (INDEPENDENT_AMBULATORY_CARE_PROVIDER_SITE_OTHER): Payer: Commercial Managed Care - HMO

## 2014-04-05 ENCOUNTER — Emergency Department (HOSPITAL_COMMUNITY): Payer: Medicare HMO

## 2014-04-05 ENCOUNTER — Encounter (HOSPITAL_COMMUNITY): Payer: Self-pay | Admitting: Emergency Medicine

## 2014-04-05 ENCOUNTER — Inpatient Hospital Stay (HOSPITAL_COMMUNITY)
Admission: EM | Admit: 2014-04-05 | Discharge: 2014-04-07 | DRG: 300 | Disposition: A | Discharge status: Home / Self Care | Payer: Medicare HMO | Attending: Internal Medicine | Admitting: Internal Medicine

## 2014-04-05 DIAGNOSIS — M1990 Unspecified osteoarthritis, unspecified site: Secondary | ICD-10-CM

## 2014-04-05 DIAGNOSIS — R1032 Left lower quadrant pain: Secondary | ICD-10-CM

## 2014-04-05 DIAGNOSIS — IMO0002 Reserved for concepts with insufficient information to code with codable children: Secondary | ICD-10-CM

## 2014-04-05 DIAGNOSIS — N189 Chronic kidney disease, unspecified: Secondary | ICD-10-CM | POA: Diagnosis present

## 2014-04-05 DIAGNOSIS — M79605 Pain in left leg: Secondary | ICD-10-CM

## 2014-04-05 DIAGNOSIS — Z87891 Personal history of nicotine dependence: Secondary | ICD-10-CM

## 2014-04-05 DIAGNOSIS — I82409 Acute embolism and thrombosis of unspecified deep veins of unspecified lower extremity: Principal | ICD-10-CM | POA: Diagnosis present

## 2014-04-05 DIAGNOSIS — I959 Hypotension, unspecified: Secondary | ICD-10-CM

## 2014-04-05 DIAGNOSIS — N200 Calculus of kidney: Secondary | ICD-10-CM

## 2014-04-05 DIAGNOSIS — R109 Unspecified abdominal pain: Secondary | ICD-10-CM

## 2014-04-05 DIAGNOSIS — E785 Hyperlipidemia, unspecified: Secondary | ICD-10-CM | POA: Diagnosis present

## 2014-04-05 DIAGNOSIS — K5190 Ulcerative colitis, unspecified, without complications: Secondary | ICD-10-CM

## 2014-04-05 DIAGNOSIS — I129 Hypertensive chronic kidney disease with stage 1 through stage 4 chronic kidney disease, or unspecified chronic kidney disease: Secondary | ICD-10-CM | POA: Diagnosis present

## 2014-04-05 DIAGNOSIS — I2510 Atherosclerotic heart disease of native coronary artery without angina pectoris: Secondary | ICD-10-CM

## 2014-04-05 DIAGNOSIS — K269 Duodenal ulcer, unspecified as acute or chronic, without hemorrhage or perforation: Secondary | ICD-10-CM

## 2014-04-05 DIAGNOSIS — Z9049 Acquired absence of other specified parts of digestive tract: Secondary | ICD-10-CM

## 2014-04-05 DIAGNOSIS — E7800 Pure hypercholesterolemia, unspecified: Secondary | ICD-10-CM

## 2014-04-05 DIAGNOSIS — D509 Iron deficiency anemia, unspecified: Secondary | ICD-10-CM

## 2014-04-05 DIAGNOSIS — E1149 Type 2 diabetes mellitus with other diabetic neurological complication: Secondary | ICD-10-CM

## 2014-04-05 DIAGNOSIS — E538 Deficiency of other specified B group vitamins: Secondary | ICD-10-CM

## 2014-04-05 DIAGNOSIS — I10 Essential (primary) hypertension: Secondary | ICD-10-CM

## 2014-04-05 DIAGNOSIS — I200 Unstable angina: Secondary | ICD-10-CM

## 2014-04-05 DIAGNOSIS — N289 Disorder of kidney and ureter, unspecified: Secondary | ICD-10-CM

## 2014-04-05 DIAGNOSIS — G4700 Insomnia, unspecified: Secondary | ICD-10-CM

## 2014-04-05 DIAGNOSIS — I498 Other specified cardiac arrhythmias: Secondary | ICD-10-CM | POA: Diagnosis present

## 2014-04-05 DIAGNOSIS — K449 Diaphragmatic hernia without obstruction or gangrene: Secondary | ICD-10-CM

## 2014-04-05 DIAGNOSIS — I252 Old myocardial infarction: Secondary | ICD-10-CM

## 2014-04-05 DIAGNOSIS — N179 Acute kidney failure, unspecified: Secondary | ICD-10-CM

## 2014-04-05 DIAGNOSIS — E860 Dehydration: Secondary | ICD-10-CM | POA: Diagnosis present

## 2014-04-05 LAB — CBC WITH DIFFERENTIAL/PLATELET
BASOS ABS: 0 10*3/uL (ref 0.0–0.1)
Basophils Relative: 0 % (ref 0–1)
EOS PCT: 0 % (ref 0–5)
Eosinophils Absolute: 0 10*3/uL (ref 0.0–0.7)
HEMATOCRIT: 36.6 % — AB (ref 39.0–52.0)
Hemoglobin: 11.2 g/dL — ABNORMAL LOW (ref 13.0–17.0)
LYMPHS ABS: 1.4 10*3/uL (ref 0.7–4.0)
LYMPHS PCT: 15 % (ref 12–46)
MCH: 21.1 pg — ABNORMAL LOW (ref 26.0–34.0)
MCHC: 30.6 g/dL (ref 30.0–36.0)
MCV: 68.9 fL — AB (ref 78.0–100.0)
MONOS PCT: 11 % (ref 3–12)
Monocytes Absolute: 1 10*3/uL (ref 0.1–1.0)
Neutro Abs: 7 10*3/uL (ref 1.7–7.7)
Neutrophils Relative %: 74 % (ref 43–77)
Platelets: 208 10*3/uL (ref 150–400)
RBC: 5.31 MIL/uL (ref 4.22–5.81)
RDW: 28.7 % — AB (ref 11.5–15.5)
WBC: 9.4 10*3/uL (ref 4.0–10.5)

## 2014-04-05 LAB — URINALYSIS, ROUTINE W REFLEX MICROSCOPIC
BILIRUBIN URINE: NEGATIVE
GLUCOSE, UA: NEGATIVE mg/dL
HGB URINE DIPSTICK: NEGATIVE
Ketones, ur: NEGATIVE mg/dL
Nitrite: NEGATIVE
Protein, ur: NEGATIVE mg/dL
SPECIFIC GRAVITY, URINE: 1.021 (ref 1.005–1.030)
UROBILINOGEN UA: 0.2 mg/dL (ref 0.0–1.0)
pH: 5 (ref 5.0–8.0)

## 2014-04-05 LAB — POCT I-STAT, CHEM 8
BUN: 40 mg/dL — ABNORMAL HIGH (ref 6–23)
Calcium, Ion: 1.14 mmol/L (ref 1.13–1.30)
Chloride: 100 mEq/L (ref 96–112)
Creatinine, Ser: 2.5 mg/dL — ABNORMAL HIGH (ref 0.50–1.35)
Glucose, Bld: 215 mg/dL — ABNORMAL HIGH (ref 70–99)
HEMATOCRIT: 39 % (ref 39.0–52.0)
Hemoglobin: 13.3 g/dL (ref 13.0–17.0)
POTASSIUM: 4.1 meq/L (ref 3.7–5.3)
Sodium: 137 mEq/L (ref 137–147)
TCO2: 24 mmol/L (ref 0–100)

## 2014-04-05 LAB — COMPREHENSIVE METABOLIC PANEL
ALBUMIN: 3.6 g/dL (ref 3.5–5.2)
ALT: 9 U/L (ref 0–53)
AST: 16 U/L (ref 0–37)
Alkaline Phosphatase: 46 U/L (ref 39–117)
BILIRUBIN TOTAL: 0.4 mg/dL (ref 0.3–1.2)
BUN: 46 mg/dL — ABNORMAL HIGH (ref 6–23)
CHLORIDE: 95 meq/L — AB (ref 96–112)
CO2: 22 mEq/L (ref 19–32)
Calcium: 9.6 mg/dL (ref 8.4–10.5)
Creatinine, Ser: 2.46 mg/dL — ABNORMAL HIGH (ref 0.50–1.35)
GFR calc Af Amer: 29 mL/min — ABNORMAL LOW (ref >90)
GFR calc non Af Amer: 25 mL/min — ABNORMAL LOW (ref >90)
Glucose, Bld: 143 mg/dL — ABNORMAL HIGH (ref 70–99)
Potassium: 3.9 mEq/L (ref 3.7–5.3)
SODIUM: 137 meq/L (ref 137–147)
Total Protein: 8.2 g/dL (ref 6.0–8.3)

## 2014-04-05 LAB — POCT URINALYSIS DIP (DEVICE)
GLUCOSE, UA: NEGATIVE mg/dL
Hgb urine dipstick: NEGATIVE
KETONES UR: NEGATIVE mg/dL
LEUKOCYTES UA: NEGATIVE
Nitrite: POSITIVE — AB
PROTEIN: 30 mg/dL — AB
Specific Gravity, Urine: >=1.03 (ref 1.005–1.030)
Urobilinogen, UA: 0.2 mg/dL (ref 0.0–1.0)
pH: 5.5 (ref 5.0–8.0)

## 2014-04-05 LAB — PROTIME-INR
INR: 1.02 (ref 0.00–1.49)
Prothrombin Time: 13.2 seconds (ref 11.6–15.2)

## 2014-04-05 LAB — URINE MICROSCOPIC-ADD ON

## 2014-04-05 MED ORDER — SODIUM CHLORIDE 0.9 % IV BOLUS (SEPSIS)
1000.0000 mL | Freq: Once | INTRAVENOUS | Status: AC
  Administered 2014-04-05: 1000 mL via INTRAVENOUS

## 2014-04-05 MED ORDER — METRONIDAZOLE 250 MG PO TABS
250.0000 mg | ORAL_TABLET | Freq: Four times a day (QID) | ORAL | Status: DC
  Administered 2014-04-06 – 2014-04-07 (×5): 250 mg via ORAL
  Filled 2014-04-05 (×10): qty 1

## 2014-04-05 MED ORDER — SODIUM CHLORIDE 0.9 % IV BOLUS (SEPSIS)
500.0000 mL | Freq: Once | INTRAVENOUS | Status: AC
  Administered 2014-04-05: 500 mL via INTRAVENOUS

## 2014-04-05 MED ORDER — ONDANSETRON HCL 4 MG PO TABS: 4.0000 mg | ORAL_TABLET | Freq: Four times a day (QID) | ORAL | Status: DC | PRN

## 2014-04-05 MED ORDER — HEPARIN SODIUM (PORCINE) 5000 UNIT/ML IJ SOLN
5000.0000 [IU] | Freq: Three times a day (TID) | INTRAMUSCULAR | Status: DC
  Administered 2014-04-06 (×2): 5000 [IU] via SUBCUTANEOUS
  Filled 2014-04-05 (×5): qty 1

## 2014-04-05 MED ORDER — SODIUM CHLORIDE 0.9 % IV SOLN
INTRAVENOUS | Status: DC
  Administered 2014-04-05: 15:00:00 via INTRAVENOUS

## 2014-04-05 MED ORDER — HYDROCODONE-ACETAMINOPHEN 5-325 MG PO TABS
1.0000 | ORAL_TABLET | ORAL | Status: DC | PRN
  Administered 2014-04-06 – 2014-04-07 (×2): 2 via ORAL
  Filled 2014-04-05 (×2): qty 2

## 2014-04-05 MED ORDER — SODIUM CHLORIDE 0.9 % IV SOLN
INTRAVENOUS | Status: AC
  Administered 2014-04-06: via INTRAVENOUS

## 2014-04-05 MED ORDER — INSULIN ASPART 100 UNIT/ML ~~LOC~~ SOLN
0.0000 [IU] | Freq: Three times a day (TID) | SUBCUTANEOUS | Status: DC
  Administered 2014-04-06 (×2): 2 [IU] via SUBCUTANEOUS

## 2014-04-05 MED ORDER — ZOLPIDEM TARTRATE 5 MG PO TABS: 5.0000 mg | ORAL_TABLET | Freq: Every day | ORAL | Status: DC

## 2014-04-05 MED ORDER — ISOSORBIDE MONONITRATE ER 60 MG PO TB24
60.0000 mg | ORAL_TABLET | Freq: Every day | ORAL | Status: DC
  Administered 2014-04-06 – 2014-04-07 (×2): 60 mg via ORAL
  Filled 2014-04-05 (×2): qty 1

## 2014-04-05 MED ORDER — ONDANSETRON HCL 4 MG/2ML IJ SOLN
4.0000 mg | Freq: Four times a day (QID) | INTRAMUSCULAR | Status: DC | PRN
  Filled 2014-04-05: qty 2

## 2014-04-05 MED ORDER — SODIUM CHLORIDE 0.9 % IJ SOLN
3.0000 mL | Freq: Two times a day (BID) | INTRAMUSCULAR | Status: DC
  Administered 2014-04-06 – 2014-04-07 (×3): 3 mL via INTRAVENOUS

## 2014-04-05 MED ORDER — NITROGLYCERIN 0.4 MG SL SUBL: 0.4000 mg | SUBLINGUAL_TABLET | SUBLINGUAL | Status: DC | PRN

## 2014-04-05 MED ORDER — FENTANYL CITRATE 0.05 MG/ML IJ SOLN
50.0000 ug | INTRAMUSCULAR | Status: DC | PRN
  Administered 2014-04-05 (×2): 50 ug via INTRAVENOUS
  Filled 2014-04-05: qty 2

## 2014-04-05 MED ORDER — HYDROMORPHONE HCL PF 1 MG/ML IJ SOLN
1.0000 mg | INTRAMUSCULAR | Status: DC | PRN
  Administered 2014-04-06 (×3): 1 mg via INTRAVENOUS
  Filled 2014-04-05 (×3): qty 1

## 2014-04-05 MED ORDER — PANTOPRAZOLE SODIUM 40 MG PO TBEC
40.0000 mg | DELAYED_RELEASE_TABLET | Freq: Every day | ORAL | Status: DC
  Administered 2014-04-06 – 2014-04-07 (×2): 40 mg via ORAL
  Filled 2014-04-05 (×2): qty 1

## 2014-04-05 MED ORDER — GLIPIZIDE ER 10 MG PO TB24
10.0000 mg | ORAL_TABLET | Freq: Every day | ORAL | Status: DC
  Administered 2014-04-06 – 2014-04-07 (×2): 10 mg via ORAL
  Filled 2014-04-05 (×2): qty 1

## 2014-04-05 MED ORDER — MINOCYCLINE HCL 50 MG PO CAPS
50.0000 mg | ORAL_CAPSULE | Freq: Every day | ORAL | Status: DC
  Administered 2014-04-06 – 2014-04-07 (×2): 50 mg via ORAL
  Filled 2014-04-05 (×2): qty 1

## 2014-04-05 NOTE — H&P (Addendum)
[facility] History and Physical  SPIKE DESILETS WUJ:[id number] DOB: Oct 17, 1946 DOA: 04/05/2014  Referring physician: ED physician PCP: [name], MD   Chief Complaint: left groin pain   HPI:  68 y.o. male with HTN, HLD, DM, UC status post colectomy and J pouch in 2005, presenting to [facility] ED with main concern of several days duration of progressively worsening left groin area pain, constant and throbbing, 7/10 in severity, radiating to the left medial thigh and left calf, down to the foot, no specific alleviating factors, worse with movement and weight bearing. Pt also reports very poor oral intake, unable to keep anything down. Pt denies known trauma to the area, no similar events in the past. Pt also reports he has been seen at the urgent care center and was told he has kidney injury but he is unaware of the details. He denies chest pain or shortness of breath, no fevers, chills, no abdominal or urinary concerns, no specific focal neurological symptoms. He was sent from [facility] to [facility] for further evaluation.   In ED, pt is hemodynamically stable, electrolyte panel notable for ARF and Cr > 2 (last Cr March 2015 was 1.01). [facility] asked to admit for further evaluation.   Assessment and Plan: Active Problems: Left groin pain - etiology unclear at this time - pelvic MRI pending  - provide analgesia as needed - PT evaluation once medically stable Acute renal failure  - etiology unclear but pt is on several medications that could be exacerbating underlying etiology which cold be simply pre renal and dehydration from poor oral intake  - will stop Lisinopril, HCTZ, Metformin for now until renal function recovers  - order urine sodium and urine creatinine for evaluation of FENa - place on IVF and repeat BMP in AM - CT abd and pelvis ordered in ED, follow up on results  HTN - will hold Lisinopril and HCTZ due to ARF and soft SBP in 90's  - will also hold Metoprolol due to bradycardia and HR in  50's - I will continue Imdur for now and will readjust the regimen as indicated  - may need to hold Imdur if BP still low  Diabetes mellitus - will hold metformin due to acute renal failure - continue Glipizide and place on SSI for now - check A1C  T12 vertebral compression deformity - on CT scan noted below - will defer to primary team in AM if MRI indicated  History of UC - status post colectomy and J pouch placement   Radiological Exams on Admission: Dg Lumbar Spine Complete  04/05/2014  There is an age-indeterminate T12 anterior vertebral body compression deformity with approximately 15% height loss. The abnormality appears to be new compared with the chest x-ray performed on 03/12/2014. If there is further clinical concern regarding the acuity of the fracture, further evaluation with bone scan or MRI is recommended.  Lumbar spine spondylosis at L4-5 and L5-S1.    Code Status: Full Family Communication: Pt at bedside Disposition Plan: Admit for further evaluation    Review of Systems:  Constitutional: Negative for diaphoresis.  HENT: Negative for hearing loss, ear pain, nosebleeds, congestion, tinnitus and ear discharge.   Eyes: Negative for blurred vision, double vision, photophobia, pain, discharge and redness.  Respiratory: Negative for cough, hemoptysis, sputum production, shortness of breath, wheezing and stridor.   Cardiovascular: Negative for chest pain, palpitations, orthopnea, claudication and leg swelling.  Gastrointestinal: Negative for heartburn, constipation, blood in stool and melena.  Genitourinary: Negative for  dysuria, urgency, frequency, hematuria and flank pain.  Musculoskeletal: Negative for joint pain and falls.  Skin: Negative for itching and rash.  Neurological: Negative for tingling, tremors, sensory change, speech change, and headaches.  Endo/Heme/Allergies: Negative for environmental allergies and polydipsia. Does not bruise/bleed easily.   Psychiatric/Behavioral: Negative for suicidal ideas. The patient is not nervous/anxious.     Past Medical History  Diagnosis Date  . Ulcerative colitis     s/p colectomy ~ 2005 at [facility]  . Renal stones     uric acid stones  . Chronic kidney disease   . Hypertension   . Hyperlipidemia   . Rosacea   . Insomnia   . Hiatal hernia   . Arthritis     Knee OA- per [facility]- injected prev by ortho  . Coronary artery disease     a. 10/1998 Cath: LM 30d, LAD 43m OM 50, RCA 100 w/ L->R collats, nl LV ->Med Rx;  b. 07/2010 MV: no ischemia.  .[name] KitchenB12 deficiency   . Osteoarthritis   . Type II diabetes mellitus   . Myocardial infarction 1999    "heart occasion" (03/15/2014)  . Pneumonia 1970's  . Anemia     Past Surgical History  Procedure Laterality Date  . Subtotal [facility]lectomy  2005    [facility] . Knee arthroscopy Bilateral 1990's  . Hernia repair Right ~ 2006    "abdomen; was as a result of a surgery"  . Cardiac catheterization  11/15/1998  . Esophagogastroduodenoscopy N/A 03/16/2014    Procedure: ESOPHAGOGASTRODUODENOSCOPY (EGD);  Surgeon: JJerene Bears MD;  [facility]  Service: Endoscopy;  Laterality: N/A;  . Flexible sigmoidoscopy N/A 03/16/2014    Procedure: FLEXIBLE SIGMOIDOSCOPY;  Surgeon: JJerene Bears MD;  [facility];  Service: Endoscopy;  Laterality: N/A;    Social History:  reports that he quit smoking about 39 years ago. His smoking use included Cigarettes. He has a 20 pack-year smoking history. He has never used smokeless tobacco. He reports that he drinks alcohol. He reports that he does not use illicit drugs.  No Known Allergies  Family History  Problem Relation Age of Onset  . Heart disease Mother   . Diabetes Mother   . COPD Father   . Colon cancer Neg Hx   . Prostate cancer Neg Hx     Prior to Admission medications   Medication Sig Start Date End Date Taking? Authorizing Provider  clarithromycin (BIAXIN) 500 MG  tablet Take 500 mg by mouth 2 (two) times daily.   Yes Historical Provider, MD  cyanocobalamin (,VITAMIN B-12,) 1000 MCG/ML injection Inject 1,000 mcg into the muscle every 30 (thirty) days. 09/07/13  Yes GTonia Ghent MD  cyclobenzaprine (FLEXERIL) 5 MG tablet Take 1 tablet (5 mg total) by mouth 3 (three) times daily as needed for muscle spasms. 03/29/14  Yes JBilly Fischer MD  glipiZIDE (GLUCOTROL XL) 10 MG 24 hr tablet Take 10 mg by mouth daily. 03/21/13  Yes GTonia Ghent MD  isosorbide mononitrate (IMDUR) 60 MG 24 hr tablet Take 1 tablet (60 mg total) by mouth daily. 03/18/14  Yes Brittainy SRosita Fire PA-C  lisinopril-hydrochlorothiazide (PRINZIDE,ZESTORETIC) 20-12.5 MG per tablet Take 2 tablets by mouth daily. 12/13/13  Yes JLorretta Harp MD  Menthol-Methyl Salicylate (MUSCLE RUB) 10-15 % CREA Apply 1 application topically as needed for muscle pain.   Yes Historical Provider, MD  metFORMIN (GLUCOPHAGE) 1000 MG tablet Take 1,000 mg by mouth 2 (  two) times daily. 03/05/14  Yes Historical Provider, MD  metoprolol succinate (TOPROL-XL) 25 MG 24 hr tablet Take 25-75 mg by mouth daily.   Yes Historical Provider, MD  metroNIDAZOLE (FLAGYL) 250 MG tablet Take 250 mg by mouth 4 (four) times daily.   Yes Historical Provider, MD  minocycline (MINOCIN,DYNACIN) 50 MG capsule Take 50 mg by mouth daily. 03/19/14  Yes Historical Provider, MD  NON FORMULARY Take 1 tablet by mouth daily. Nattokinase Complex one tab daily   Yes Historical Provider, MD  NON FORMULARY Take 12 mg by mouth daily. Astaxanthin   Yes Historical Provider, MD  NON FORMULARY Take 1 tablet by mouth daily. Diabetes Multi Vitamin and Minerals one tablet daily   Yes Historical Provider, MD  omeprazole (PRILOSEC) 20 MG capsule Take 20 mg by mouth 2 (two) times daily before a meal.   Yes Historical Provider, MD  OVER THE COUNTER MEDICATION Apply topically daily as needed. 03/06/14  Yes Historical Provider, MD  pantoprazole (PROTONIX) 40 MG tablet  Take 1 tablet (40 mg total) by mouth daily. 03/18/14  Yes Brittainy Simmons, PA-C  Probiotic Product (VSL#3) CAPS Take 1 capsule by mouth daily. 03/23/14  Yes Jerene Bears, MD  traMADol (ULTRAM) 50 MG tablet Take 100 mg by mouth 3 (three) times daily.    Yes Historical Provider, MD  zolpidem (AMBIEN) 10 MG tablet Take 10 mg by mouth at bedtime.   Yes Historical Provider, MD  NEEDLE, DISP, 25 G (BD DISP NEEDLES) 25G X 5/8" MISC 25 g by Does not apply route every 30 (thirty) days. 03/29/12   Tonia Ghent, MD  nitroGLYCERIN (NITROSTAT) 0.4 MG SL tablet Place 0.4 mg under the tongue every 5 (five) minutes as needed for chest pain. 03/07/14   [name], MD  Syringe, Disposable, 1 ML MISC 1 mL by Does not apply route every 30 (thirty) days. 03/29/12   Tonia Ghent, MD    Physical Exam: Filed Vitals:   04/05/14 1745 04/05/14 1847 04/05/14 2030 04/05/14 2115  BP: 112/67  120/82 107/93  Pulse: 93 105 53   Temp:      TempSrc:      Resp: 20  22 25   Height:      Weight:      SpO2: 96% 96% 97%     Physical Exam  Constitutional: Appears well-developed and well-nourished. No distress.  HENT: Normocephalic. External right and left ear normal. Oropharynx is clear and moist.  Eyes: Conjunctivae and EOM are normal. PERRLA, no scleral icterus.  Neck: Normal ROM. Neck supple. No JVD. No tracheal deviation. No thyromegaly.  CVS: RRR, S1/S2 +, no murmurs, no gallops, no carotid bruit.  Pulmonary: Effort and breath sounds normal, no stridor, rhonchi, wheezes, rales.  Abdominal: Soft. BS +,  no distension, tenderness, rebound or guarding.  Musculoskeletal: Normal range of motion. No edema and no tenderness.  Lymphadenopathy: No lymphadenopathy noted, cervical, inguinal. Neuro: Alert. Normal reflexes, muscle tone coordination. No cranial nerve deficit. Skin: Skin is warm and dry. No rash noted. Not diaphoretic. No erythema. No pallor.  Psychiatric: Normal mood and affect. Behavior, judgment, thought  content normal.   Labs on Admission:  Basic Metabolic Panel:  Recent Labs Lab 04/05/14 1235 04/05/14 1405  NA 137 137  K 4.1 3.9  CL 100 95*  CO2  --  22  GLUCOSE 215* 143*  BUN 40* 46*  CREATININE 2.50* 2.46*  CALCIUM  --  9.6   Liver Function Tests:  Recent  Labs Lab 04/05/14 1405  AST 16  ALT 9  ALKPHOS 46  BILITOT 0.4  PROT 8.2  ALBUMIN 3.6   CBC:  Recent Labs Lab 04/05/14 1235 04/05/14 1405  WBC  --  9.4  NEUTROABS  --  7.0  HGB 13.3 11.2*  HCT 39.0 36.6*  MCV  --  68.9*  PLT  --  208   EKG: Normal sinus rhythm, no ST/T wave changes  Theodis Blaze, MD  [facility] Pager [phone]  If 7PM-7AM, please contact night-coverage [email or web address] Password [email or web address] 04/05/2014, 10:10 PM

## 2014-04-05 NOTE — ED Notes (Addendum)
Pt  Reports   l  Leg      Pain             With   Pain  radaiting  dowward  From  Groin           denys  specfic  Injury    Color  Is  Pale         Pt  Appears  Uncomfortable              Yet  Is  Alert  And  Oriented      Seen    1  Week  Ago  ucc            History  Of  Anemia

## 2014-04-05 NOTE — ED Notes (Signed)
RN with pt while transporting to MRI.

## 2014-04-05 NOTE — ED Provider Notes (Signed)
CSN: [id number]     Arrival date & time 04/05/14  1130 History   First MD Initiated Contact with Patient 04/05/14 1148     Chief Complaint  Patient presents with  . Leg Pain   (Consider location/radiation/quality/duration/timing/severity/associated sxs/prior Treatment) HPI Comments: Reports approximately 2 weeks ago he developed an acute onset ("like lightening") of lower back pain that improved over several days. Then 3 days ago he developed pain in his left groin that radiates to the back of his left knee, down his posterior calf. This discomfort is made worse by sitting and made slightly better by lying supine. Denies fever, hematuria, flank pain, melena.  Denies changes in strength or sensation of his LLE. No swelling of LLE. No rashes in areas of discomfort. No bowel or bladder incontinence. No perineal anesthesia. No recent injury.  The history is provided by the patient.    Past Medical History  Diagnosis Date  . Ulcerative colitis     s/p colectomy ~ 2005 at [facility]  . Renal stones     uric acid stones  . Chronic kidney disease   . Hypertension   . Hyperlipidemia   . Rosacea   . Insomnia   . Hiatal hernia   . Arthritis     Knee OA- per [facility]- injected prev by ortho  . Coronary artery disease     a. 10/1998 Cath: LM 30d, LAD 37m OM 50, RCA 100 w/ L->R collats, nl LV ->Med Rx;  b. 07/2010 MV: no ischemia.  .[name] KitchenB12 deficiency   . Osteoarthritis   . Type II diabetes mellitus   . Myocardial infarction 1999    "heart occasion" (03/15/2014)  . Pneumonia 1970's  . Anemia    Past Surgical History  Procedure Laterality Date  . Subtotal [facility]lectomy  2005    [facility] . Knee arthroscopy Bilateral 1990's  . Hernia repair Right ~ 2006    "abdomen; was as a result of a surgery"  . Cardiac catheterization  11/15/1998  . Esophagogastroduodenoscopy N/A 03/16/2014    Procedure: ESOPHAGOGASTRODUODENOSCOPY (EGD);  Surgeon: JJerene Bears MD;  [facility];  Service: Endoscopy;  Laterality: N/A;  . Flexible sigmoidoscopy N/A 03/16/2014    Procedure: FLEXIBLE SIGMOIDOSCOPY;  Surgeon: JJerene Bears MD;  [facility];  Service: Endoscopy;  Laterality: N/A;   Family History  Problem Relation Age of Onset  . Heart disease Mother   . Diabetes Mother   . COPD Father   . Colon cancer Neg Hx   . Prostate cancer Neg Hx    History  Substance Use Topics  . Smoking status: Former Smoker -- 2.00 packs/day for 10 years    Types: Cigarettes    Quit date: 12/28/1974  . Smokeless tobacco: Never Used  . Alcohol Use: Yes     Comment: 03/15/2014 "6 pack beer or less/yr"    Review of Systems  All other systems reviewed and are negative.   Allergies  Review of patient's allergies indicates no known allergies.  Home Medications   Current Outpatient Rx  Name  Route  Sig  Dispense  Refill  . clarithromycin (BIAXIN) 500 MG tablet      Take 1 tablet by mouth twice a day for 14 days   28 tablet   0   . cyanocobalamin (,VITAMIN B-12,) 1000 MCG/ML injection   Intramuscular   Inject 1,000 mcg into the muscle every 30 (thirty) days.         .[name]  cyclobenzaprine (FLEXERIL) 5 MG tablet   Oral   Take 1 tablet (5 mg total) by mouth 3 (three) times daily as needed for muscle spasms.   30 tablet   0   . Dietary Management Product (VSL#3) PACK   Oral   Take 1 each by mouth 2 (two) times daily.   60 each   0   . glipiZIDE (GLUCOTROL XL) 10 MG 24 hr tablet   Oral   Take 10 mg by mouth daily.         . isosorbide mononitrate (IMDUR) 60 MG 24 hr tablet   Oral   Take 1 tablet (60 mg total) by mouth daily.   30 tablet   5   . lisinopril-hydrochlorothiazide (PRINZIDE,ZESTORETIC) 20-12.5 MG per tablet   Oral   Take 2 tablets by mouth daily.         . metFORMIN (GLUCOPHAGE) 1000 MG tablet   Oral   Take 1,000 mg by mouth 2 (two) times daily.         . metoprolol succinate (TOPROL-XL) 25 MG 24 hr tablet   Oral   Take 3  tablets (75 mg total) by mouth daily.   90 tablet   5   . metroNIDAZOLE (FLAGYL) 250 MG tablet      Take 1 tablet by mouth four times a day for 14 days   56 tablet   0   . minocycline (MINOCIN,DYNACIN) 50 MG capsule   Oral   Take 50 mg by mouth daily.         [name] NEEDLE, DISP, 25 G (BD DISP NEEDLES) 25G X 5/8" MISC   Does not apply   25 g by Does not apply route every 30 (thirty) days.   12 each   0   . nitroGLYCERIN (NITROSTAT) 0.4 MG SL tablet   Sublingual   Place 0.4 mg under the tongue every 5 (five) minutes as needed for chest pain.         . NON FORMULARY   Oral   Take 1 tablet by mouth daily. Nattokinase Complex one tab daily         . NON FORMULARY   Oral   Take 12 mg by mouth daily. Astaxanthin  One 12 mg tablet daily for skin         . NON FORMULARY   Oral   Take 1 tablet by mouth daily. Diabetes Multi Vitamin and Minerals one tablet daily         . omeprazole (PRILOSEC) 20 MG capsule      Take 1 tablet by mouth twice a day for 14 days   28 capsule   0   . OVER THE COUNTER MEDICATION   Topical   Apply topically daily as needed.         . pantoprazole (PROTONIX) 40 MG tablet   Oral   Take 1 tablet (40 mg total) by mouth daily.   30 tablet   5   . Probiotic Product (VSL#3) CAPS   Oral   Take 1 capsule by mouth daily.   30 capsule   6   . Syringe, Disposable, 1 ML MISC   Does not apply   1 mL by Does not apply route every 30 (thirty) days.   12 each   0   . traMADol (ULTRAM) 50 MG tablet   Oral   Take 50 mg by mouth 3 (three) times daily. Take 2 tablets by mouth three times daily.         [name]  traMADol (ULTRAM) 50 MG tablet   Oral   Take 1 tablet (50 mg total) by mouth every 6 (six) hours as needed. For neck pain   20 tablet   0   . zolpidem (AMBIEN) 10 MG tablet      TAKE 1 TABLET BY MOUTH EVERY NIGHT AT BEDTIME AS NEEDED   30 tablet   0    BP 105/72  Pulse 122  Temp(Src) 98.6 F (37 C) (Oral)  Resp 20  SpO2  97% Physical Exam  Nursing note and vitals reviewed. Constitutional: He is oriented to person, place, and time. He appears well-developed and well-nourished. No distress.  HENT:  Head: Normocephalic and atraumatic.  Mouth/Throat: Oropharynx is clear and moist.  Eyes: Conjunctivae are normal. No scleral icterus.  Neck: Normal range of motion. Neck supple.  Cardiovascular: Regular rhythm and normal heart sounds.  Tachycardia present.   Pulmonary/Chest: Effort normal and breath sounds normal. No respiratory distress. He has no wheezes.  Abdominal: Normal appearance and bowel sounds are normal. He exhibits no distension, no abdominal bruit and no mass. There is no hepatosplenomegaly. There is no tenderness. There is no CVA tenderness. No hernia. Hernia confirmed negative in the right inguinal area and confirmed negative in the left inguinal area.  Musculoskeletal: Normal range of motion.       Left hip: Normal.       Left knee: Normal.       Left ankle: Normal.       Thoracic back: Normal.       Lumbar back: Normal.  Neurological: He is alert and oriented to person, place, and time.  Skin: Skin is warm and dry.  Psychiatric: He has a normal mood and affect. His behavior is normal.    ED Course  Procedures (including critical care time) Labs Review Labs Reviewed  POCT I-STAT, CHEM 8 - Abnormal; Notable for the following:    BUN 40 (*)    Creatinine, Ser 2.50 (*)    Glucose, Bld 215 (*)    All other components within normal limits  POCT URINALYSIS DIP (DEVICE) - Abnormal; Notable for the following:    Bilirubin Urine SMALL (*)    Protein, ur 30 (*)    Nitrite POSITIVE (*)    All other components within normal limits   Imaging Review Dg Lumbar Spine Complete  04/05/2014   CLINICAL DATA:  Pain for 2 weeks in the lower back  EXAM: LUMBAR SPINE - COMPLETE 4+ VIEW  COMPARISON:  DG CHEST 2 VIEW dated 03/12/2014  FINDINGS: There are 5 nonrib bearing lumbar-type vertebral bodies. There is an  age-indeterminate T12 anterior vertebral body compression deformity with approximately 15% height loss. The alignment is anatomic. There is no spondylolysis. There is no acute fracture or static listhesis. There is minimal degenerative disc disease at L4-5 and L5-S1 with bilateral facet arthropathy.  The SI joints are unremarkable.  There is abdominal aortic atherosclerosis.  IMPRESSION: There is an age-indeterminate T12 anterior vertebral body compression deformity with approximately 15% height loss. The abnormality appears to be new compared with the chest x-ray performed on 03/12/2014. If there is further clinical concern regarding the acuity of the fracture, further evaluation with bone scan or MRI is recommended.  Lumbar spine spondylosis at L4-5 and L5-S1.   Electronically Signed   By: Kathreen Devoid   On: 04/05/2014 13:08     MDM   1. Left groin pain   2. Acute renal insufficiency    I  have concern that patient's creatinine has more than doubled in last 3 weeks and that he is tachycardic and with relative hypotension for a patient that is normally medicated for hypertension. He is no longer anemic, but did require recent transfusion for anemia. Upper and lower endoscopy results with limited evidence for source of blood loss. I am concerned that he may have abdominal process or LS spine process causing referred pain to groin. May benefit from CT A&P to investigate for persistent/obtructive ureterolithiasis causing elevated creatinine and pain or abdominal vascular process to explain above. LS spine films with ?T12 compression deformity, however, patient experiences no discomfort with palpation of thoracic or lumbar spine. No report of recent injury.  UA with proteinuria and +nitrite, but no LE. ?validity Will transfer to [facility] for further investigation.  [place], [place] 04/05/14 1320

## 2014-04-05 NOTE — ED Provider Notes (Signed)
Assumed care from Dr. Eulis Foster at 5 PM. Patient awaiting MRA to evaluate for aortic dissection. He had left groin pain going down his left leg for the past 4 days. Mild tachycardia and hypotension in response to fluids. No chest pain or back pain. He has palpable PT pulses bilaterally  MRA shows no evidence of aortic dissection. CT scan shows calcification in bladder and uncomplicated abdominal wall hernias.  With new renal failure and borderline blood pressures and tachycardia, patient will be admitted.  BP 135/84  Pulse 80  Temp(Src) 99.8 F (37.7 C) (Oral)  Resp 20  Ht 5' 7"  (1.702 m)  Wt 199 lb 11.8 oz (90.6 kg)  BMI 31.28 kg/m2  SpO2 96%   Ezequiel Essex, MD 04/06/14 [phone]

## 2014-04-05 NOTE — ED Provider Notes (Signed)
CSN: [id number]     Arrival date & time 04/05/14  1340 History   None    No chief complaint on file.    (Consider location/radiation/quality/duration/timing/severity/associated sxs/prior Treatment) The history is provided by the patient.   Manuel Grant is a 68 y.o. male who is here for evaluation of  Left leg pain. Pain has been progressive over several days. It is also been associated recently, with neck pain, decreased appetite, and dizziness with exertion. He was evaluated at the urgent care Center about one week ago. He went. There again today to be evaluated and was found to have acute kidney injury and unspecified type of left leg pain. He was therefore, appropriately, sent for further evaluation. He denies chest pain, cough, fever, or chills. There are no other known modifying factors.   Past Medical History  Diagnosis Date  . Ulcerative colitis     s/p colectomy ~ 2005 at [facility]  . Renal stones     uric acid stones  . Chronic kidney disease   . Hypertension   . Hyperlipidemia   . Rosacea   . Insomnia   . Hiatal hernia   . Arthritis     Knee OA- per [facility]- injected prev by ortho  . Coronary artery disease     a. 10/1998 Cath: LM 30d, LAD 54m OM 50, RCA 100 w/ L->R collats, nl LV ->Med Rx;  b. 07/2010 MV: no ischemia.  .[name] KitchenB12 deficiency   . Osteoarthritis   . Type II diabetes mellitus   . Myocardial infarction 1999    "heart occasion" (03/15/2014)  . Pneumonia 1970's  . Anemia    Past Surgical History  Procedure Laterality Date  . Subtotal colectomy  2005    [facility] . Knee arthroscopy Bilateral 1990's  . Hernia repair Right ~ 2006    "abdomen; was as a result of a surgery"  . Cardiac catheterization  11/15/1998  . Esophagogastroduodenoscopy N/A 03/16/2014    Procedure: ESOPHAGOGASTRODUODENOSCOPY (EGD);  Surgeon: JJerene Bears MD;  [facility];  Service: Endoscopy;  Laterality: N/A;  . Flexible sigmoidoscopy N/A 03/16/2014     Procedure: FLEXIBLE SIGMOIDOSCOPY;  Surgeon: JJerene Bears MD;  [facility];  Service: Endoscopy;  Laterality: N/A;   Family History  Problem Relation Age of Onset  . Heart disease Mother   . Diabetes Mother   . COPD Father   . Colon cancer Neg Hx   . Prostate cancer Neg Hx    History  Substance Use Topics  . Smoking status: Former Smoker -- 2.00 packs/day for 10 years    Types: Cigarettes    Quit date: 12/28/1974  . Smokeless tobacco: Never Used  . Alcohol Use: Yes     Comment: 03/15/2014 "6 pack beer or less/yr"    Review of Systems  All other systems reviewed and are negative.     Allergies  Review of patient's allergies indicates no known allergies.  Home Medications   Current Outpatient Rx  Name  Route  Sig  Dispense  Refill  . clarithromycin (BIAXIN) 500 MG tablet   Oral   Take 500 mg by mouth 2 (two) times daily.         . cyanocobalamin (,VITAMIN B-12,) 1000 MCG/ML injection   Intramuscular   Inject 1,000 mcg into the muscle every 30 (thirty) days.         . cyclobenzaprine (FLEXERIL) 5 MG tablet   Oral  Take 1 tablet (5 mg total) by mouth 3 (three) times daily as needed for muscle spasms.   30 tablet   0   . glipiZIDE (GLUCOTROL XL) 10 MG 24 hr tablet   Oral   Take 10 mg by mouth daily.         . isosorbide mononitrate (IMDUR) 60 MG 24 hr tablet   Oral   Take 1 tablet (60 mg total) by mouth daily.   30 tablet   5   . lisinopril-hydrochlorothiazide (PRINZIDE,ZESTORETIC) 20-12.5 MG per tablet   Oral   Take 2 tablets by mouth daily.         . Menthol-Methyl Salicylate (MUSCLE RUB) 10-15 % CREA   Topical   Apply 1 application topically as needed for muscle pain.         . metFORMIN (GLUCOPHAGE) 1000 MG tablet   Oral   Take 1,000 mg by mouth 2 (two) times daily.         . metoprolol succinate (TOPROL-XL) 25 MG 24 hr tablet   Oral   Take 25-75 mg by mouth daily.         . metroNIDAZOLE (FLAGYL) 250 MG tablet    Oral   Take 250 mg by mouth 4 (four) times daily.         . minocycline (MINOCIN,DYNACIN) 50 MG capsule   Oral   Take 50 mg by mouth daily.         . NON FORMULARY   Oral   Take 1 tablet by mouth daily. Nattokinase Complex one tab daily         . NON FORMULARY   Oral   Take 12 mg by mouth daily. Astaxanthin         . NON FORMULARY   Oral   Take 1 tablet by mouth daily. Diabetes Multi Vitamin and Minerals one tablet daily         . omeprazole (PRILOSEC) 20 MG capsule   Oral   Take 20 mg by mouth 2 (two) times daily before a meal.         . OVER THE COUNTER MEDICATION   Topical   Apply topically daily as needed.         . pantoprazole (PROTONIX) 40 MG tablet   Oral   Take 1 tablet (40 mg total) by mouth daily.   30 tablet   5   . Probiotic Product (VSL#3) CAPS   Oral   Take 1 capsule by mouth daily.   30 capsule   6   . traMADol (ULTRAM) 50 MG tablet   Oral   Take 100 mg by mouth 3 (three) times daily.          [name] zolpidem (AMBIEN) 10 MG tablet   Oral   Take 10 mg by mouth at bedtime.         [name] NEEDLE, DISP, 25 G (BD DISP NEEDLES) 25G X 5/8" MISC   Does not apply   25 g by Does not apply route every 30 (thirty) days.   12 each   0   . nitroGLYCERIN (NITROSTAT) 0.4 MG SL tablet   Sublingual   Place 0.4 mg under the tongue every 5 (five) minutes as needed for chest pain.         . Syringe, Disposable, 1 ML MISC   Does not apply   1 mL by Does not apply route every 30 (thirty) days.   12 each   0  BP 105/72  Pulse 91  Temp(Src) 98.2 F (36.8 C) (Oral)  Resp 24  Ht 5' 7"  (1.702 m)  Wt 200 lb (90.719 kg)  BMI 31.32 kg/m2  SpO2 97% Physical Exam  Nursing note and vitals reviewed. Constitutional: He is oriented to person, place, and time. He appears well-developed.  Elderly, frail  HENT:  Head: Normocephalic and atraumatic.  Right Ear: External ear normal.  Left Ear: External ear normal.  Eyes: Conjunctivae and EOM are  normal. Pupils are equal, round, and reactive to light.  Neck: Normal range of motion and phonation normal. Neck supple.  Cardiovascular: Normal rate, regular rhythm, normal heart sounds and intact distal pulses.   Pulmonary/Chest: Effort normal and breath sounds normal. He exhibits no bony tenderness.  Abdominal: Soft. There is no tenderness.  Musculoskeletal: Normal range of motion.  Neurological: He is alert and oriented to person, place, and time. No cranial nerve deficit or sensory deficit. He exhibits normal muscle tone. Coordination normal.  Skin: Skin is warm, dry and intact.  Psychiatric: He has a normal mood and affect. His behavior is normal. Judgment and thought content normal.    ED Course  Procedures (including critical care time)  14: 00- initial evaluation is concerning for aortic dissection, with associated hypotension, renal insufficiency, and likely decreased perfusion, left leg. Her recent diagnosis of peptic ulcer disease, and received blood transfusions, last week. He does not take anticoagulants.  14:02- I discussed the case with Dr. Nevada Crane, radiologist. We decided to proceed with evaluation of his aorta, with MR, testing. The patient has mild, tachycardia and hypotension, but does not appear acutely unstable. Fluid resuscitation, ordered.  Medications  0.9 %  sodium chloride infusion ( Intravenous New Bag/Given 04/05/14 1501)  fentaNYL (SUBLIMAZE) injection 50 mcg (50 mcg Intravenous Given 04/05/14 1511)  sodium chloride 0.9 % bolus 1,000 mL (0 mLs Intravenous Stopped 04/05/14 1501)  sodium chloride 0.9 % bolus 500 mL (500 mLs Intravenous New Bag/Given 04/05/14 1511)    Patient Vitals for the past 24 hrs:  BP Temp Temp src Pulse Resp SpO2 Height Weight  04/05/14 1645 105/72 mmHg - - 91 24 97 % - -  04/05/14 1545 104/72 mmHg - - 94 20 98 % - -  04/05/14 1530 94/49 mmHg - - 104 22 91 % - -  04/05/14 1508 96/68 mmHg - - - - - - -  04/05/14 1501 94/64 mmHg - - - - - - -   04/05/14 1501 83/62 mmHg - - - - - - -  04/05/14 1500 94/64 mmHg - - 101 23 95 % - -  04/05/14 1430 100/61 mmHg - - 105 24 91 % - -  04/05/14 1415 91/64 mmHg - - 109 23 96 % - -  04/05/14 1413 - 98.2 F (36.8 C) Oral - - - - -  04/05/14 1405 95/82 mmHg - - 116 18 96 % 5' 7"  (1.702 m) 200 lb (90.719 kg)  04/05/14 1345 95/82 mmHg 98 F (36.7 C) - 116 20 98 % - -    16:39- I discussed the case with the on-call chest surgeon, Dr. Roxan Hockey. He does not feel that this patient needs urgent operative intervention prior to return of his imaging, by MR. He is available for consultation, as needed.  16:40-  PM Reevaluation with update and discussion. After initial assessment and treatment, an updated evaluation reveals He is more comfortable now, BP improved, 104/67. Left posterior tibial pulse is present. He remains lucid. Vira Agar  L [place]      Labs Review Labs Reviewed  CBC WITH DIFFERENTIAL - Abnormal; Notable for the following:    Hemoglobin 11.2 (*)    HCT 36.6 (*)    MCV 68.9 (*)    MCH 21.1 (*)    RDW 28.7 (*)    All other components within normal limits  COMPREHENSIVE METABOLIC PANEL - Abnormal; Notable for the following:    Chloride 95 (*)    Glucose, Bld 143 (*)    BUN 46 (*)    Creatinine, Ser 2.46 (*)    GFR calc non Af Amer 25 (*)    GFR calc Af Amer 29 (*)    All other components within normal limits  PROTIME-INR  URINALYSIS, ROUTINE W REFLEX MICROSCOPIC   Imaging Review Dg Lumbar Spine Complete  04/05/2014   CLINICAL DATA:  Pain for 2 weeks in the lower back  EXAM: LUMBAR SPINE - COMPLETE 4+ VIEW  COMPARISON:  DG CHEST 2 VIEW dated 03/12/2014  FINDINGS: There are 5 nonrib bearing lumbar-type vertebral bodies. There is an age-indeterminate T12 anterior vertebral body compression deformity with approximately 15% height loss. The alignment is anatomic. There is no spondylolysis. There is no acute fracture or static listhesis. There is minimal degenerative disc disease at  L4-5 and L5-S1 with bilateral facet arthropathy.  The SI joints are unremarkable.  There is abdominal aortic atherosclerosis.  IMPRESSION: There is an age-indeterminate T12 anterior vertebral body compression deformity with approximately 15% height loss. The abnormality appears to be new compared with the chest x-ray performed on 03/12/2014. If there is further clinical concern regarding the acuity of the fracture, further evaluation with bone scan or MRI is recommended.  Lumbar spine spondylosis at L4-5 and L5-S1.   Electronically Signed   By: Kathreen Devoid   On: 04/05/2014 13:08     EKG Interpretation   Date/Time:  Thursday April 05 2014 13:56:07 EDT Ventricular Rate:  118 PR Interval:  127 QRS Duration: 91 QT Interval:  329 QTC Calculation: 461 R Axis:   10 Text Interpretation:  Sinus tachycardia Abnormal R-wave progression, early  transition since last tracing no significant change Confirmed by Eulis Foster   MD, Vira Agar ([id number]) on 04/05/2014 5:19:11 PM      MDM   Final diagnoses:  Hypotension  Renal insufficiency  Leg pain, left    Hypotension, with renal insufficiency, and leg pain, as well as a constellation of symptoms that are concerning for aortic dissection. Patient will need to be fully stabilized prior to consideration for discharge. He does not need immediate surgery at the time of initial evaluation. Case has been discussed with thoracic surgery.  Nursing Notes Reviewed/ Care Coordinated, and agree without changes. Applicable Imaging Reviewed.  Interpretation of Laboratory Data incorporated into ED treatment   CRITICAL CARE Performed by: Richarda Blade Total critical care time:45 minutes Critical care time was exclusive of separately billable procedures and treating other patients. Critical care was necessary to treat or prevent imminent or life-threatening deterioration. Critical care was time spent personally by me on the following activities: development of treatment  plan with patient and/or surrogate as well as nursing, discussions with consultants, evaluation of patient's response to treatment, examination of patient, obtaining history from patient or surrogate, ordering and performing treatments and interventions, ordering and review of laboratory studies, ordering and review of radiographic studies, pulse oximetry and re-evaluation of patient's condition.   Care to Dr. Wyvonnia Dusky- 17:00    Richarda Blade, MD 04/07/14  0909 

## 2014-04-05 NOTE — Progress Notes (Signed)
Called ER for report. RN on the phone with EMS. Will call back.

## 2014-04-05 NOTE — ED Notes (Signed)
Pt taken to CT.

## 2014-04-05 NOTE — ED Notes (Signed)
Pt c/o left groin pain that shoots down to left leg that started 4 days ago. Pt was seen at [facility] today for same and referred here for same due to tachycardia and hypotension. Patient sts ambulatory but gait unsteady. Pt in NAD. Bilateral pedal pulses palpated. Bilateral extremities have full ROM

## 2014-04-05 NOTE — ED Notes (Signed)
Dr. Eulis Foster made aware of pt. Pain and hypotension. sts pt to get 564m bolus MRI called sts apprximate 1 hour wait. Dr. WEulis Fosterand patient made aware.

## 2014-04-05 NOTE — ED Notes (Signed)
Pt back from CT

## 2014-04-05 NOTE — ED Notes (Signed)
Pt attempt to void for urine specimen but unable to void at this time.

## 2014-04-05 NOTE — ED Notes (Signed)
Pt back from MRI - Dr Wyvonnia Dusky in room.

## 2014-04-05 NOTE — ED Notes (Signed)
Pt ambulatory to bathroom - ok received from Dr Wyvonnia Dusky. Pt tolerated well.

## 2014-04-05 NOTE — ED Provider Notes (Signed)
Medical screening examination/treatment/procedure(s) were performed by non-physician practitioner and as supervising physician I was immediately available for consultation/collaboration.  Philipp Deputy, M.D.  Harden Mo, MD 04/05/14 [phone]

## 2014-04-05 NOTE — Progress Notes (Addendum)
Called  ER RN no  answer.

## 2014-04-06 ENCOUNTER — Encounter (HOSPITAL_COMMUNITY): Payer: Self-pay | Admitting: *Deleted

## 2014-04-06 DIAGNOSIS — M79609 Pain in unspecified limb: Secondary | ICD-10-CM

## 2014-04-06 LAB — BASIC METABOLIC PANEL
BUN: 31 mg/dL — ABNORMAL HIGH (ref 6–23)
CALCIUM: 8.8 mg/dL (ref 8.4–10.5)
CO2: 22 meq/L (ref 19–32)
Chloride: 103 mEq/L (ref 96–112)
Creatinine, Ser: 1.27 mg/dL (ref 0.50–1.35)
GFR calc Af Amer: 65 mL/min — ABNORMAL LOW (ref >90)
GFR calc non Af Amer: 57 mL/min — ABNORMAL LOW (ref >90)
Glucose, Bld: 90 mg/dL (ref 70–99)
POTASSIUM: 3.8 meq/L (ref 3.7–5.3)
SODIUM: 141 meq/L (ref 137–147)

## 2014-04-06 LAB — CBC
HCT: 32.9 % — ABNORMAL LOW (ref 39.0–52.0)
HEMOGLOBIN: 10 g/dL — AB (ref 13.0–17.0)
MCH: 21.1 pg — ABNORMAL LOW (ref 26.0–34.0)
MCHC: 30.4 g/dL (ref 30.0–36.0)
MCV: 69.3 fL — AB (ref 78.0–100.0)
Platelets: 175 10*3/uL (ref 150–400)
RBC: 4.75 MIL/uL (ref 4.22–5.81)
RDW: 28.8 % — ABNORMAL HIGH (ref 11.5–15.5)
WBC: 6.2 10*3/uL (ref 4.0–10.5)

## 2014-04-06 LAB — HEMOGLOBIN A1C
HEMOGLOBIN A1C: 6.2 % — AB (ref <5.7)
Hgb A1c MFr Bld: 6.2 % — ABNORMAL HIGH (ref <5.7)
MEAN PLASMA GLUCOSE: 131 mg/dL — AB (ref <117)
MEAN PLASMA GLUCOSE: 131 mg/dL — AB (ref <117)

## 2014-04-06 LAB — TSH: TSH: 0.812 u[IU]/mL (ref 0.350–4.500)

## 2014-04-06 LAB — GLUCOSE, CAPILLARY
GLUCOSE-CAPILLARY: 133 mg/dL — AB (ref 70–99)
GLUCOSE-CAPILLARY: 57 mg/dL — AB (ref 70–99)
Glucose-Capillary: 166 mg/dL — ABNORMAL HIGH (ref 70–99)
Glucose-Capillary: 176 mg/dL — ABNORMAL HIGH (ref 70–99)
Glucose-Capillary: 73 mg/dL (ref 70–99)
Glucose-Capillary: 87 mg/dL (ref 70–99)

## 2014-04-06 LAB — SODIUM, URINE, RANDOM: SODIUM UR: 75 meq/L

## 2014-04-06 LAB — CK: CK TOTAL: 59 U/L (ref 7–232)

## 2014-04-06 MED ORDER — SODIUM CHLORIDE 0.9 % IV SOLN
INTRAVENOUS | Status: AC
  Administered 2014-04-06: 14:00:00 via INTRAVENOUS

## 2014-04-06 MED ORDER — ENOXAPARIN SODIUM 100 MG/ML ~~LOC~~ SOLN
90.0000 mg | Freq: Two times a day (BID) | SUBCUTANEOUS | Status: DC
  Administered 2014-04-06 – 2014-04-07 (×2): 90 mg via SUBCUTANEOUS
  Filled 2014-04-06 (×4): qty 1

## 2014-04-06 NOTE — Progress Notes (Signed)
ANTICOAGULATION CONSULT NOTE - Initial Consult  Pharmacy Consult for lovenox  Indication: DVT  No Known Allergies  Patient Measurements: Height: 5' 7"  (170.2 cm) Weight: 199 lb 11.8 oz (90.6 kg) IBW/kg (Calculated) : 66.1   Vital Signs: Temp: 98.6 F (37 C) (04/10 0944) Temp src: Oral (04/10 0944) BP: 116/74 mmHg (04/10 0944) Pulse Rate: 102 (04/10 0944)  Labs:  Recent Labs  04/05/14 1235 04/05/14 1405 04/06/14 0605 04/06/14 1034  HGB 13.3 11.2* 10.0*  --   HCT 39.0 36.6* 32.9*  --   PLT  --  208 175  --   LABPROT  --  13.2  --   --   INR  --  1.02  --   --   CREATININE 2.50* 2.46* 1.27  --   CKTOTAL  --   --   --  59    Estimated Creatinine Clearance: 58.9 ml/min (by C-G formula based on Cr of 1.27).   Medical History: Past Medical History  Diagnosis Date  . Ulcerative colitis     s/p colectomy ~ 2005 at [facility]  . Renal stones     uric acid stones  . Chronic kidney disease   . Hypertension   . Hyperlipidemia   . Rosacea   . Insomnia   . Hiatal hernia   . Arthritis     Knee OA- per [facility]- injected prev by ortho  . Coronary artery disease     a. 10/1998 Cath: LM 30d, LAD 28m OM 50, RCA 100 w/ L->R collats, nl LV ->Med Rx;  b. 07/2010 MV: no ischemia.  .[name] KitchenB12 deficiency   . Osteoarthritis   . Type II diabetes mellitus   . Myocardial infarction 1999    "heart occasion" (03/15/2014)  . Pneumonia 1970's  . Anemia     Medications:  Prescriptions prior to admission  Medication Sig Dispense Refill  . clarithromycin (BIAXIN) 500 MG tablet Take 500 mg by mouth 2 (two) times daily.      . cyanocobalamin (,VITAMIN B-12,) 1000 MCG/ML injection Inject 1,000 mcg into the muscle every 30 (thirty) days.      . cyclobenzaprine (FLEXERIL) 5 MG tablet Take 1 tablet (5 mg total) by mouth 3 (three) times daily as needed for muscle spasms.  30 tablet  0  . glipiZIDE (GLUCOTROL XL) 10 MG 24 hr tablet Take 10 mg by mouth daily.      . isosorbide mononitrate  (IMDUR) 60 MG 24 hr tablet Take 1 tablet (60 mg total) by mouth daily.  30 tablet  5  . lisinopril-hydrochlorothiazide (PRINZIDE,ZESTORETIC) 20-12.5 MG per tablet Take 2 tablets by mouth daily.      . Menthol-Methyl Salicylate (MUSCLE RUB) 10-15 % CREA Apply 1 application topically as needed for muscle pain.      . metFORMIN (GLUCOPHAGE) 1000 MG tablet Take 1,000 mg by mouth 2 (two) times daily.      . metoprolol succinate (TOPROL-XL) 25 MG 24 hr tablet Take 25-75 mg by mouth daily.      . metroNIDAZOLE (FLAGYL) 250 MG tablet Take 250 mg by mouth 4 (four) times daily.      . minocycline (MINOCIN,DYNACIN) 50 MG capsule Take 50 mg by mouth daily.      . NON FORMULARY Take 1 tablet by mouth daily. Nattokinase Complex one tab daily      . NON FORMULARY Take 12 mg by mouth daily. Astaxanthin      . NON FORMULARY Take 1 tablet by mouth daily.  Diabetes Multi Vitamin and Minerals one tablet daily      . omeprazole (PRILOSEC) 20 MG capsule Take 20 mg by mouth 2 (two) times daily before a meal.      . OVER THE COUNTER MEDICATION Apply topically daily as needed.      . pantoprazole (PROTONIX) 40 MG tablet Take 1 tablet (40 mg total) by mouth daily.  30 tablet  5  . Probiotic Product (VSL#3) CAPS Take 1 capsule by mouth daily.  30 capsule  6  . traMADol (ULTRAM) 50 MG tablet Take 100 mg by mouth 3 (three) times daily.       [name] zolpidem (AMBIEN) 10 MG tablet Take 10 mg by mouth at bedtime.      [name] NEEDLE, DISP, 25 G (BD DISP NEEDLES) 25G X 5/8" MISC 25 g by Does not apply route every 30 (thirty) days.  12 each  0  . nitroGLYCERIN (NITROSTAT) 0.4 MG SL tablet Place 0.4 mg under the tongue every 5 (five) minutes as needed for chest pain.      . Syringe, Disposable, 1 ML MISC 1 mL by Does not apply route every 30 (thirty) days.  12 each  0    Assessment: 68 yo man to start lovenox for DVT.  His CrCL ~58 ml/min.  Hg 10.0, PTLC 175 Goal of Therapy:  Anti-Xa level 0.6-1 units/ml 4hrs after LMWH dose  given Monitor platelets by anticoagulation protocol: Yes   Plan:  Lovenox 90 mg sq q12 hours CBC q 3 days while on lovenox. Monitor for bleeding  Corinn Stoltzfus Poteet Kiyonna Tortorelli 04/06/2014,12:17 PM

## 2014-04-06 NOTE — Progress Notes (Signed)
   CARE MANAGEMENT NOTE 04/06/2014  Patient:  Manuel Grant, Manuel Grant   Account Number:  [id number]  Date Initiated:  04/06/2014  Documentation initiated by:  Lizabeth Leyden  Subjective/Objective Assessment:   admitted with left groin pain radiating to thigh, AKI elevated CR 2.50  recent INPT: anemia     Action/Plan:   progression of care and discharge planning   Anticipated DC Date:  04/08/2014   Anticipated DC Plan:  HOME/SELF CARE         Choice offered to / List presented to:             Status of service:  In process, will continue to follow Medicare Important Message given?   (If response is "NO", the following Medicare IM given date fields will be blank) Date Medicare IM given:   Date Additional Medicare IM given:    Discharge Disposition:    Per UR Regulation:  Reviewed for med. necessity/level of care/duration of stay  If discussed at [place] of Stay Meetings, dates discussed:    Comments:

## 2014-04-06 NOTE — Progress Notes (Signed)
Utilization review completed.  

## 2014-04-06 NOTE — Progress Notes (Signed)
Inpatient Diabetes Program Recommendations  AACE/ADA: New Consensus Statement on Inpatient Glycemic Control (2013)  Target Ranges:  Prepandial:   less than 140 mg/dL      Peak postprandial:   less than 180 mg/dL (1-2 hours)      Critically ill patients:  140 - 180 mg/dL   Results for DARROLD, BEZEK (MRN [id number]) as of 04/06/2014 11:48  Ref. Range 04/06/2014 00:12 04/06/2014 00:43 04/06/2014 07:23  Glucose-Capillary Latest Range: 70-99 mg/dL 57 (L) 73 87   Diabetes history: DM2 Outpatient Diabetes medications: Glipizide 10 mg daily, Metformin 1000 mg BID Current orders for Inpatient glycemic control: Glipizide 10 mg daily, Novolog 0-9 units AC  Inpatient Diabetes Program Recommendations Oral Agents: Noted low blood sugar early this morning.  Please consider discontinuing Glipizide while inpatient. Diet: Please consider changing diet from Regular to Carb Modified Diabetic diet.  Thanks, Barnie Alderman, RN, MSN, CCRN Diabetes Coordinator Inpatient Diabetes Program [phone] (Team Pager) [phone] ([facility] office) [phone] [facility] office)

## 2014-04-06 NOTE — Progress Notes (Signed)
Patient Demographics  Manuel Grant, is a 68 y.o. male, DOB - Mar 11, 1946, MBW:[id number]  Admit date - 04/05/2014   Admitting Physician Theodis Blaze, MD  Outpatient Primary MD for the patient is [name], MD  LOS - 1   No chief complaint on file.       Assessment & Plan    Active Problems:   Diabetes mellitus type 2 with neurological manifestations   Pure hypercholesterolemia   Ulcerative colitis   H/O colectomy   CAD (coronary artery disease)   OA (osteoarthritis)   Acute renal failure   Left low back pain and some left-sided high and groin pain  Most likely arising from L-spine - Hip- ileo. psoas strain or bursitis. Will get MRI of L-spine, MRA chest abdomen pelvis was unremarkable which was looking for dissection. CT scan abdomen pelvis nonacute. UA is stable. Will have PT evaluate, supportive care for now.    Acute renal failure  - Due to combination of dehydration along with lisinopril and HCTZ use, resolved completely with hydration and holding of offending medications.    HTN  Stable, was dehydrated and currently blood pressure medications on hold.     Diabetes mellitus  - will hold metformin due to acute renal failure  - continue Glipizide and place on SSI for now    Lab Results  Component Value Date   HGBA1C 7.4* 03/08/2014    CBG (last 3)   Recent Labs  04/06/14 0012 04/06/14 0043 04/06/14 0723  GLUCAP 57* 73 87     T12 vertebral compression deformity  - Getting MRI L-spine   History of UC  - status post colectomy and J pouch placement      Code Status: Full  Family Communication: None present  Disposition Plan: Home   Procedures MRA chest and abdomen, MRI of spine, CT scan abdomen pelvis   Consults     Medications  Scheduled  Meds: . glipiZIDE  10 mg Oral Daily  . heparin  5,000 Units Subcutaneous 3 times per day  . insulin aspart  0-9 Units Subcutaneous TID WC  . isosorbide mononitrate  60 mg Oral Daily  . metroNIDAZOLE  250 mg Oral QID  . minocycline  50 mg Oral Daily  . pantoprazole  40 mg Oral Daily  . sodium chloride  3 mL Intravenous Q12H  . zolpidem  5 mg Oral QHS   Continuous Infusions: . sodium chloride     PRN Meds:.HYDROcodone-acetaminophen, HYDROmorphone (DILAUDID) injection, nitroGLYCERIN, ondansetron (ZOFRAN) IV  DVT Prophylaxis   Heparin    Lab Results  Component Value Date   PLT 175 04/06/2014    Antibiotics     Anti-infectives   Start     Dose/Rate Route Frequency Ordered Stop   04/06/14 1000  minocycline (MINOCIN,DYNACIN) capsule 50 mg     50 mg Oral Daily 04/05/14 2358     04/06/14 0000  metroNIDAZOLE (FLAGYL) tablet 250 mg     250 mg Oral 4 times daily 04/05/14 2358            Subjective:   Manuel Grant today has, No headache, No chest pain, No abdominal pain - No Nausea, No new weakness tingling or numbness, No Cough - SOB. Improved left  groin- thigh pain and back pain.  Objective:   Filed Vitals:   04/05/14 2230 04/05/14 2300 04/06/14 0015 04/06/14 0402  BP: 123/77 105/89 135/84 103/69  Pulse:  104 80 104  Temp:   99.8 F (37.7 C) 99.1 F (37.3 C)  TempSrc:      Resp: 27 22 20 18   Height:   5' 7"  (1.702 m)   Weight:   90.6 kg (199 lb 11.8 oz)   SpO2:  95% 96% 100%    Wt Readings from Last 3 Encounters:  04/06/14 90.6 kg (199 lb 11.8 oz)  03/27/14 92.942 kg (204 lb 14.4 oz)  03/17/14 96.888 kg (213 lb 9.6 oz)     Intake/Output Summary (Last 24 hours) at 04/06/14 0915 Last data filed at 04/06/14 0600  Gross per 24 hour  Intake 666.25 ml  Output    500 ml  Net 166.25 ml     Physical Exam  Awake Alert, Oriented X 3, No new F.N deficits, Normal affect [place].AT,PERRAL Supple Neck,No JVD, No cervical lymphadenopathy appriciated.  Symmetrical Chest  wall movement, Good air movement bilaterally, CTAB RRR,No Gallops,Rubs or new Murmurs, No Parasternal Heave +ve B.Sounds, Abd Soft, Non tender, No organomegaly appriciated, No rebound - guarding or rigidity. No Cyanosis, Clubbing or edema, No new Rash or bruise    No L-spine tenderness, good range of motion and L-spine, strength 5 x 5 in both lower extremities with downgoing plantars. Left thigh is soft with no redness or cellulitis. No palpable Baker's cyst behind the left knee.   Data Review   Micro Results No results found for this or any previous visit (from the past 240 hour(s)).  Radiology Reports Ct Abdomen Pelvis Wo Contrast  04/05/2014   CLINICAL DATA:  Left flank pain.  EXAM: CT ABDOMEN AND PELVIS WITHOUT CONTRAST  TECHNIQUE: Multidetector CT imaging of the abdomen and pelvis was performed following the standard protocol without intravenous contrast.  COMPARISON:  None.  FINDINGS: BODY WALL: Supra and infraumbilical midline abdominal wall hernias containing nonobstructed bowel. There is a hernia within the right lower quadrant which traverses the rectus abdominus, and may be a remote stomal hernia. This hernia contains small bowel with enteroenterostomy, nonobstructed.  LOWER CHEST: Multi focal coronary artery atherosclerosis. No cardiomegaly. No lower lung consolidation.  ABDOMEN/PELVIS:  Liver: No focal abnormality.  Biliary: Cholelithiasis.  No gallbladder distention or inflammation.  Pancreas: Unremarkable.  Spleen: Unremarkable.  Adrenals: Unremarkable.  Kidneys and ureters: Symmetric fullness of the renal collecting system, without hydronephrosis. Exophytic water density mass from the interpolar right kidney, 41 mm in diameter, most consistent with cyst.  Bladder: There is minimal layering high density material within the right bladder lumen.  Reproductive: Unremarkable.  Bowel: Colectomy, likely with ileal pouch anal anastomosis. No bowel obstruction. No enteritis.  Retroperitoneum: No  mass or adenopathy.  Peritoneum: No free fluid or gas.  Vascular: No acute abnormality.  OSSEOUS: No acute abnormality. Left SI joint ankylosis is likely degenerative given bony productive change.  IMPRESSION: 1. Layering calcification in the bladder may reflect recently passed calculi. No hydronephrosis or ureteral calculus. 2. Three abdominal wall hernias containing nonobstructed small bowel, as above. 3. Cholelithiasis without acute cholecystitis.   Electronically Signed   By: Jorje Guild M.D.   On: 04/05/2014 22:54   Dg Chest 2 View  03/12/2014   CLINICAL DATA:  Pre angio procedure.  Chest pain fatigue.  EXAM: CHEST  2 VIEW  COMPARISON:  None.  FINDINGS: Trachea is midline. Heart  size normal. Mild scarring at the lung bases. Lungs are otherwise clear. No pleural fluid. Degenerative changes are seen in the spine. Mild compression of a mid thoracic vertebral body is age indeterminate.  IMPRESSION: 1. No acute findings. 2. Mild compression of a mid thoracic vertebral body is age indeterminate.   Electronically Signed   By: Lorin Picket M.D.   On: 03/12/2014 14:12   Dg Cervical Spine Complete  03/29/2014   CLINICAL DATA:  Posterior neck pain radiating into both shoulders.  EXAM: CERVICAL SPINE  4+ VIEWS  COMPARISON:  None.  FINDINGS: There is moderate degenerative disc disease at C5-6 and C6-7 and C7-T1. There is moderate left facet arthritis at C4-5. There is slight left foraminal stenosis at C5-6.  No fracture or subluxation or prevertebral soft tissue swelling. Small old benign calcifications in the nuchal ligament at C5-6.  IMPRESSION: No acute abnormality. Degenerative disc disease in the lower cervical spine.   Electronically Signed   By: Rozetta Nunnery M.D.   On: 03/29/2014 19:51   Dg Lumbar Spine Complete  04/05/2014   CLINICAL DATA:  Pain for 2 weeks in the lower back  EXAM: LUMBAR SPINE - COMPLETE 4+ VIEW  COMPARISON:  DG CHEST 2 VIEW dated 03/12/2014  FINDINGS: There are 5 nonrib bearing  lumbar-type vertebral bodies. There is an age-indeterminate T12 anterior vertebral body compression deformity with approximately 15% height loss. The alignment is anatomic. There is no spondylolysis. There is no acute fracture or static listhesis. There is minimal degenerative disc disease at L4-5 and L5-S1 with bilateral facet arthropathy.  The SI joints are unremarkable.  There is abdominal aortic atherosclerosis.  IMPRESSION: There is an age-indeterminate T12 anterior vertebral body compression deformity with approximately 15% height loss. The abnormality appears to be new compared with the chest x-ray performed on 03/12/2014. If there is further clinical concern regarding the acuity of the fracture, further evaluation with bone scan or MRI is recommended.  Lumbar spine spondylosis at L4-5 and L5-S1.   Electronically Signed   By: Kathreen Devoid   On: 04/05/2014 13:08   Mr Mra Chest Wo Contrast  04/06/2014   CLINICAL DATA:  Chest pain, hypertension and renal insufficiency.  EXAM: MRA CHEST WITH OR WITHOUT CONTRAST  TECHNIQUE: Angiographic images of the chest were obtained using MRA technique without intravenous contrast.  CONTRAST:  No contrast was administered due to significant renal insufficiency.  COMPARISON:  None.  FINDINGS: The thoracic aorta is of normal caliber demonstrates no evidence of aneurysmal disease or dissection. No intramural hemorrhage is identified. Proximal great vessels show normal branching anatomy. No masses or other mediastinal abnormalities are visualized. No evidence of enlarged lymph nodes.  No pleural or pericardial fluid is identified. Visualized bony structures are unremarkable.  IMPRESSION: Normal caliber thoracic aorta without evidence of dissection or intramural hemorrhage.   Electronically Signed   By: Aletta Edouard M.D.   On: 04/06/2014 08:26   Mr Jodene Nam Pelvis Wo Contrast  04/06/2014   CLINICAL DATA:  Hypotension and groin pain radiating into back.  EXAM: MRA ABDOMEN AND  PELVIS WITHOUT CONTRAST  TECHNIQUE: Multiplanar, multiecho pulse sequences of the abdomen and pelvis were obtained without intravenous contrast. Angiographic images of abdomen and pelvis were obtained using unenhanced MRA technique.  CONTRAST:  None  COMPARISON:  None.  FINDINGS: MRA ABDOMEN FINDINGS  The abdominal aorta is of normal caliber. Visualized proximal segments of the celiac axis, superior mesenteric artery, bilateral single renal arteries in the inferior mesenteric artery  show normal patency. There is no evidence of retroperitoneal or intraperitoneal hemorrhage.  Unenhanced appearance of the solid organs of the abdomen is unremarkable. A cyst of the posterior right kidney appears likely benign. No masses or enlarged lymph nodes are identified. Visualized bowel is unremarkable.  MRA PELVIS FINDINGS  Pelvic vessels are unremarkable. There is no evidence of hemorrhage. A lower abdominal wall hernia is present to the right of midline containing bowel. No abnormal fluid is seen in the hernia. There also appears to be in additional lower midline hernia.  IMPRESSION: No evidence of aortic aneurysm or dissection. No additional significant findings in the abdomen or pelvis.   Electronically Signed   By: Aletta Edouard M.D.   On: 04/06/2014 08:47   Mr Jodene Nam Abdomen Wo Contrast  04/06/2014   CLINICAL DATA:  Hypotension and groin pain radiating into back.  EXAM: MRA ABDOMEN AND PELVIS WITHOUT CONTRAST  TECHNIQUE: Multiplanar, multiecho pulse sequences of the abdomen and pelvis were obtained without intravenous contrast. Angiographic images of abdomen and pelvis were obtained using unenhanced MRA technique.  CONTRAST:  None  COMPARISON:  None.  FINDINGS: MRA ABDOMEN FINDINGS  The abdominal aorta is of normal caliber. Visualized proximal segments of the celiac axis, superior mesenteric artery, bilateral single renal arteries in the inferior mesenteric artery show normal patency. There is no evidence of  retroperitoneal or intraperitoneal hemorrhage.  Unenhanced appearance of the solid organs of the abdomen is unremarkable. A cyst of the posterior right kidney appears likely benign. No masses or enlarged lymph nodes are identified. Visualized bowel is unremarkable.  MRA PELVIS FINDINGS  Pelvic vessels are unremarkable. There is no evidence of hemorrhage. A lower abdominal wall hernia is present to the right of midline containing bowel. No abnormal fluid is seen in the hernia. There also appears to be in additional lower midline hernia.  IMPRESSION: No evidence of aortic aneurysm or dissection. No additional significant findings in the abdomen or pelvis.   Electronically Signed   By: Aletta Edouard M.D.   On: 04/06/2014 08:47    CBC  Recent Labs Lab 04/05/14 1235 04/05/14 1405 04/06/14 0605  WBC  --  9.4 6.2  HGB 13.3 11.2* 10.0*  HCT 39.0 36.6* 32.9*  PLT  --  208 175  MCV  --  68.9* 69.3*  MCH  --  21.1* 21.1*  MCHC  --  30.6 30.4  RDW  --  28.7* 28.8*  LYMPHSABS  --  1.4  --   MONOABS  --  1.0  --   EOSABS  --  0.0  --   BASOSABS  --  0.0  --     Chemistries   Recent Labs Lab 04/05/14 1235 04/05/14 1405 04/06/14 0605  NA 137 137 141  K 4.1 3.9 3.8  CL 100 95* 103  CO2  --  22 22  GLUCOSE 215* 143* 90  BUN 40* 46* 31*  CREATININE 2.50* 2.46* 1.27  CALCIUM  --  9.6 8.8  AST  --  16  --   ALT  --  9  --   ALKPHOS  --  46  --   BILITOT  --  0.4  --    ------------------------------------------------------------------------------------------------------------------ estimated creatinine clearance is 58.9 ml/min (by C-G formula based on Cr of 1.27). ------------------------------------------------------------------------------------------------------------------ No results found for this basename: HGBA1C,  in the last 72 hours ------------------------------------------------------------------------------------------------------------------ No results found for this  basename: CHOL, HDL, LDLCALC, TRIG, CHOLHDL, LDLDIRECT,  in the last 72 hours ------------------------------------------------------------------------------------------------------------------  Recent Labs  04/06/14 0605  TSH 0.812   ------------------------------------------------------------------------------------------------------------------ No results found for this basename: VITAMINB12, FOLATE, FERRITIN, TIBC, IRON, RETICCTPCT,  in the last 72 hours  Coagulation profile  Recent Labs Lab 04/05/14 1405  INR 1.02    No results found for this basename: DDIMER,  in the last 72 hours  Cardiac Enzymes No results found for this basename: CK, CKMB, TROPONINI, MYOGLOBIN,  in the last 168 hours ------------------------------------------------------------------------------------------------------------------ No components found with this basename: POCBNP,      Time Spent in minutes  35   Thurnell Lose M.D on 04/06/2014 at 9:15 AM  Between 7am to 7pm - Pager - [phone]  After 7pm go to [email or web address] - password [email or web address]  And look for the night coverage person covering for me after hours  [facility] Office  [phone]

## 2014-04-06 NOTE — Progress Notes (Signed)
Hypoglycemic Event  CBG: Results for LANE, ELAND (MRN [id number]) as of 04/06/2014 00:42  Ref. Range 04/06/2014 00:12  Glucose-Capillary Latest Range: 70-99 mg/dL 57 (L)    Treatment: 15 GM carbohydrate snack  Symptoms: None  Follow-up CBG: Time:12:44 AM CBG Result: 73  Possible Reasons for Event: Inadequate meal intake  Comments/MD notified:    Viviano Simas  Remember to initiate Hypoglycemia Order Set & complete

## 2014-04-06 NOTE — Progress Notes (Signed)
Blood pressure jumped from 139/122 to 78/40. Took blood pressure 5 times on both arms on 2 different machines and it continued to jump. Notified nurse.

## 2014-04-06 NOTE — Evaluation (Signed)
Physical Therapy Evaluation Patient Details Name: Manuel Grant MRN: [id number] DOB: 12/04/1946 Today's Date: 04/06/2014   History of Present Illness  68 y.o. male with HTN, HLD, DM, UC status post colectomy and J pouch in 2005, presenting to [facility] ED with main concern of several days duration of progressively worsening left groin area pain, constant and throbbing, 7/10 in severity, radiating to the left medial thigh and left calf, down to the foot, no specific alleviating factors, worse with movement and weight bearing.  Clinical Impression  Pt adm due to the above. Presents with decline in functional mobility secondary to pain and deficits indicated below. Pt describes pain in Lt posterior portion of knee as "achy and tight". Pt denies any pain radiating or shooting at this time. Pt to benefit from skilled PT to address deficits listed below and maximize functional mobility prior to D/C home. Mechanism of injury per pt was "turning and twisting in yard". MRI of lumbar spine planned at this time.     Follow Up Recommendations Outpatient PT;Supervision - Intermittent    Equipment Recommendations  None recommended by PT    Recommendations for Other Services       Precautions / Restrictions Precautions Precautions: None Restrictions Weight Bearing Restrictions: No      Mobility  Bed Mobility Overal bed mobility: Modified Independent             General bed mobility comments: effortful and used handrails  Transfers Overall transfer level: Modified independent Equipment used: None             General transfer comment: effortful due to pain; no sway or LOB noted  Ambulation/Gait Ambulation/Gait assistance: Supervision Ambulation Distance (Feet): 100 Feet Assistive device: None Gait Pattern/deviations: Decreased stance time - left;Decreased step length - right;Antalgic;Wide base of support Gait velocity: decreased due to pain Gait velocity interpretation: Below normal  speed for age/gender General Gait Details: pt with antalgic gt secondary to pain in Lt groin; pain increases with increased distance; cues for upright posture; no LOB noted; reports pain as "achy and tight behind Lt knee" denies any pain above or below knee  Stairs            Wheelchair Mobility    Modified Rankin (Stroke Patients Only)       Balance Overall balance assessment: No apparent balance deficits (not formally assessed)                                           Pertinent Vitals/Pain 5/10; "achy and tight" primarily with [name] expects to be discharged to:: Private residence Living Arrangements: Spouse/significant other Available Help at Discharge: Family;Available PRN/intermittently Type of Home: House Home Access: Stairs to enter Entrance Stairs-Rails: Right Entrance Stairs-Number of Steps: 5 Home Layout: One level Home Equipment: Cane - single point;Shower seat - built in      Prior Function Level of Independence: Independent         Comments: pt reports he was working in the yard and "turning and twisting" then days later he noticed a pain in his Lt lower back that woke him from sleep and has persisted as "aching pain in Lt groin".     Hand Dominance        Extremity/Trunk Assessment   Upper Extremity Assessment: Overall WFL for tasks assessed  Lower Extremity Assessment: Overall WFL for tasks assessed (Lt groin pain and behind Lt knee cap pain)      Cervical / Trunk Assessment: Normal  Communication   Communication: No difficulties  Cognition Arousal/Alertness: Awake/alert Behavior During Therapy: WFL for tasks assessed/performed Overall Cognitive Status: Within Functional Limits for tasks assessed                      General Comments General comments (skin integrity, edema, etc.): palpated behind Lt knee; c/o "tenderness" at hamstring insertion site and around [facility]       Exercises        Assessment/Plan    PT Assessment Patient needs continued PT services  PT Diagnosis Abnormality of gait;Acute pain   PT Problem List Decreased mobility;Decreased knowledge of use of DME;Pain  PT Treatment Interventions DME instruction;Gait training;Stair training;Functional mobility training;Therapeutic activities;Therapeutic exercise;Balance training;Neuromuscular re-education   PT Goals (Current goals can be found in the Care Plan section) Acute Rehab PT Goals Patient Stated Goal: to figure out what is going on with my leg PT Goal Formulation: With patient Time For Goal Achievement: 04/13/14 Potential to Achieve Goals: Good    Frequency Min 3X/week   Barriers to discharge        Co-evaluation               End of Session Equipment Utilized During Treatment: Gait belt Activity Tolerance: Patient tolerated treatment well Patient left: in chair;with call bell/phone within reach;with family/visitor present;with nursing/sitter in room Nurse Communication: Mobility status         Time: 4098-1191 PT Time Calculation (min): 20 min   Charges:   PT Evaluation $Initial PT Evaluation Tier I: 1 Procedure PT Treatments $Gait Training: 8-22 mins   PT G CodesGustavus Bryant, [place] [phone] 04/06/2014, 10:33 AM

## 2014-04-06 NOTE — Progress Notes (Addendum)
*  PRELIMINARY RESULTS* Vascular Ultrasound Bilateral Lower extremity venous duplex has been completed.  Preliminary findings: Right = No evidence of DVT. Left = extensive, occlusive DVT noted in common femoral, femoral, popliteal, gastroc, posterior tibial, and peroneal veins. No baker's cyst bilaterally.  Called results to [place], Therapist, sports. She will let MD know.   Chapman Fitch RVT 04/06/2014, 11:55 AM

## 2014-04-07 DIAGNOSIS — I82409 Acute embolism and thrombosis of unspecified deep veins of unspecified lower extremity: Secondary | ICD-10-CM | POA: Diagnosis present

## 2014-04-07 LAB — URINE CULTURE: Colony Count: 9000

## 2014-04-07 LAB — GLUCOSE, CAPILLARY: Glucose-Capillary: 115 mg/dL — ABNORMAL HIGH (ref 70–99)

## 2014-04-07 MED ORDER — RIVAROXABAN (XARELTO) EDUCATION KIT FOR DVT/PE PATIENTS
PACK | Freq: Once | Status: AC
  Administered 2014-04-07: 11:00:00
  Filled 2014-04-07: qty 1

## 2014-04-07 MED ORDER — RIVAROXABAN 15 MG PO TABS
15.0000 mg | ORAL_TABLET | Freq: Two times a day (BID) | ORAL | Status: DC
  Administered 2014-04-07: 15 mg via ORAL
  Filled 2014-04-07 (×3): qty 1

## 2014-04-07 MED ORDER — METFORMIN HCL 1000 MG PO TABS: 1000.0000 mg | ORAL_TABLET | Freq: Two times a day (BID) | ORAL | Status: DC

## 2014-04-07 MED ORDER — RIVAROXABAN 15 MG PO TABS
15.0000 mg | ORAL_TABLET | Freq: Two times a day (BID) | ORAL | Status: DC
Start: 2014-04-07 — End: 2014-04-19

## 2014-04-07 NOTE — Progress Notes (Addendum)
ANTICOAGULATION CONSULT NOTE - Initial Consult  Pharmacy Consult for xarelto  Indication: VTE treatment  No Known Allergies  Patient Measurements: Height: 5' 7"  (170.2 cm) Weight: 201 lb 11.5 oz (91.5 kg) IBW/kg (Calculated) : 66.1 Heparin Dosing Weight:   Vital Signs: Temp: 99 F (37.2 C) (04/11 0408) BP: 98/52 mmHg (04/11 0408) Pulse Rate: 82 (04/11 0408)  Labs:  Recent Labs  04/05/14 1235 04/05/14 1405 04/06/14 0605 04/06/14 1034  HGB 13.3 11.2* 10.0*  --   HCT 39.0 36.6* 32.9*  --   PLT  --  208 175  --   LABPROT  --  13.2  --   --   INR  --  1.02  --   --   CREATININE 2.50* 2.46* 1.27  --   CKTOTAL  --   --   --  59    Estimated Creatinine Clearance: 59.2 ml/min (by C-G formula based on Cr of 1.27).   Medical History: Past Medical History  Diagnosis Date  . Ulcerative colitis     s/p colectomy ~ 2005 at [facility]  . Renal stones     uric acid stones  . Chronic kidney disease   . Hypertension   . Hyperlipidemia   . Rosacea   . Insomnia   . Hiatal hernia   . Arthritis     Knee OA- per [facility]- injected prev by ortho  . Coronary artery disease     a. 10/1998 Cath: LM 30d, LAD 56m OM 50, RCA 100 w/ L->R collats, nl LV ->Med Rx;  b. 07/2010 MV: no ischemia.  .[name] KitchenB12 deficiency   . Osteoarthritis   . Type II diabetes mellitus   . Myocardial infarction 1999    "heart occasion" (03/15/2014)  . Pneumonia 1970's  . Anemia     Medications:    Assessment:  Left = extensive, occlusive DVT noted in common femoral, femoral, popliteal, gastroc, posterior tibial, and peroneal veins. xarelto to begin for VTE treatment.    Goal of Therapy:  xarelto 15 bid for 21 days then 20 mg daily thereafter.  Monitor platelets by anticoagulation protocol: Yes   Plan:  xarelto starter kit to begin today with 18mbid x42 doses then convert to 20 mg daily.  DC  Sq heparin   WiCurlene Dolphin/10/2014,7:04 AM

## 2014-04-07 NOTE — Care Management Note (Signed)
    Page 1 of 1   04/07/2014     4:20:52 PM   CARE MANAGEMENT NOTE 04/07/2014  Patient:  Manuel Grant, Manuel Grant   Account Number:  [id number]  Date Initiated:  04/06/2014  Documentation initiated by:  Lizabeth Leyden  Subjective/Objective Assessment:   admitted with left groin pain radiating to thigh, AKI elevated CR 2.50  recent INPT: anemia     Action/Plan:   progression of care and discharge planning   Anticipated DC Date:  04/08/2014   Anticipated DC Plan:  [place]  CM consult  Medication Assistance      Choice offered to / List presented to:             Status of service:  Completed, signed off Medicare Important Message given?   (If response is "NO", the following Medicare IM given date fields will be blank) Date Medicare IM given:   Date Additional Medicare IM given:    Discharge Disposition:  HOME/SELF CARE  Per UR Regulation:  Reviewed for med. necessity/level of care/duration of stay  If discussed at [place] of Stay Meetings, dates discussed:    Comments:  04/07/14 09:30 CM met with pt and pt's spouse in room.  CM activated 30 day free trial card for pt.  Pt verbalized understanding the 30 day free trial period of time will give him enough time to visist his physician for a follow up appt and if need be have the follow up office pre-authorize the medication.  No other CM needs were communicated.  Mariane Masters, BSN, [place] [phone].

## 2014-04-07 NOTE — Discharge Instructions (Signed)
Follow with Primary MD [name], MD in 7 days   Get CBC, CMP, checked 7 days by Primary MD and again as instructed by your Primary MD. Get a 2 view Chest X ray done next visit if you had Pneumonia of Lung problems at the Hospital.   Activity: As tolerated with Full fall precautions use walker/cane & assistance as needed   Disposition Home     Diet: Heart Healthy  Low Carb  For Heart failure patients - Check your Weight same time everyday, if you gain over 2 pounds, or you develop in leg swelling, experience more shortness of breath or chest pain, call your Primary MD immediately. Follow Cardiac Low Salt Diet and 1.8 lit/day fluid restriction.   On your next visit with her primary care physician please Get Medicines reviewed and adjusted.  Please request your Prim.MD to go over all Hospital Tests and Procedure/Radiological results at the follow up, please get all Hospital records sent to your Prim MD by signing hospital release before you go home.   If you experience worsening of your admission symptoms, develop shortness of breath, life threatening emergency, suicidal or homicidal thoughts you must seek medical attention immediately by calling 911 or calling your MD immediately  if symptoms less severe.  You Must read complete instructions/literature along with all the possible adverse reactions/side effects for all the Medicines you take and that have been prescribed to you. Take any new Medicines after you have completely understood and accpet all the possible adverse reactions/side effects.   Do not drive and provide baby sitting services if your were admitted for syncope or siezures until you have seen by Primary MD or a Neurologist and advised to do so again.  Do not drive when taking Pain medications.    Do not take more than prescribed Pain, Sleep and Anxiety Medications  Special Instructions: If you have smoked or chewed Tobacco  in the last 2 yrs please stop smoking,  stop any regular Alcohol  and or any Recreational drug use.  Wear Seat belts while driving.   Please note  You were cared for by a hospitalist during your hospital stay. If you have any questions about your discharge medications or the care you received while you were in the hospital after you are discharged, you can call the unit and asked to speak with the hospitalist on call if the hospitalist that took care of you is not available. Once you are discharged, your primary care physician will handle any further medical issues. Please note that NO REFILLS for any discharge medications will be authorized once you are discharged, as it is imperative that you return to your primary care physician (or establish a relationship with a primary care physician if you do not have one) for your aftercare needs so that they can reassess your need for medications and monitor your lab values.  Information on my medicine - XARELTO (rivaroxaban)  This medication education was reviewed with me or my healthcare representative as part of my discharge preparation.  The pharmacist that spoke with me during my hospital stay was:  Angelica Chessman, [place]? Xarelto was prescribed to treat blood clots that may have been found in the veins of your legs (deep vein thrombosis) or in your lungs (pulmonary embolism) and to reduce the risk of them occurring again.  What do you need to know about Xarelto? The starting dose is one 15 mg tablet taken TWICE  daily with food for the FIRST 21 DAYS then on 04/27/14  the dose is changed to one 20 mg tablet taken ONCE A DAY with your evening meal.  DO NOT stop taking Xarelto without talking to the health care provider who prescribed the medication.  Refill your prescription for 20 mg tablets before you run out.  After discharge, you should have regular check-up appointments with your healthcare provider that is prescribing your Xarelto.  In the future  your dose may need to be changed if your kidney function changes by a significant amount.  What do you do if you miss a dose? If you are taking Xarelto TWICE DAILY and you miss a dose, take it as soon as you remember. You may take two 15 mg tablets (total 30 mg) at the same time then resume your regularly scheduled 15 mg twice daily the next day.  If you are taking Xarelto ONCE DAILY and you miss a dose, take it as soon as you remember on the same day then continue your regularly scheduled once daily regimen the next day. Do not take two doses of Xarelto at the same time.   Important Safety Information Xarelto is a blood thinner medicine that can cause bleeding. You should call your healthcare provider right away if you experience any of the following:   Bleeding from an injury or your nose that does not stop.   Unusual colored urine (red or dark brown) or unusual colored stools (red or black).   Unusual bruising for unknown reasons.   A serious fall or if you hit your head (even if there is no bleeding).  Some medicines may interact with Xarelto and might increase your risk of bleeding while on Xarelto. To help avoid this, consult your healthcare provider or pharmacist prior to using any new prescription or non-prescription medications, including herbals, vitamins, non-steroidal anti-inflammatory drugs (NSAIDs) and supplements.  This website has more information on Xarelto: [email or web address]

## 2014-04-07 NOTE — Discharge Summary (Signed)
Manuel Grant, is a 68 y.o. male  DOB 07/04/1946  MRN [id number].  Admission date:  04/05/2014  Admitting Physician  Theodis Blaze, MD  Discharge Date:  04/07/2014   Primary MD  [name], MD  Recommendations for primary care physician for things to follow:   Monitor BP, BMP, Xaralto dose.   Admission Diagnosis  CAD (coronary artery disease) [414.00] Renal insufficiency [593.9] Leg pain, left [729.5] B12 deficiency [266.2] Hypotension [458.9]   Discharge Diagnosis  CAD (coronary artery disease) [414.00] Renal insufficiency [593.9] Leg pain, left [729.5] B12 deficiency [266.2] Hypotension [458.9]    L Leg DVT Principal Problem:   DVT (deep venous thrombosis) Active Problems:   Diabetes mellitus type 2 with neurological manifestations   Pure hypercholesterolemia   Ulcerative colitis   H/O colectomy   CAD (coronary artery disease)   OA (osteoarthritis)   Acute renal failure      Past Medical History  Diagnosis Date  . Ulcerative colitis     s/p colectomy ~ 2005 at [facility]  . Renal stones     uric acid stones  . Chronic kidney disease   . Hypertension   . Hyperlipidemia   . Rosacea   . Insomnia   . Hiatal hernia   . Arthritis     Knee OA- per [facility]- injected prev by ortho  . Coronary artery disease     a. 10/1998 Cath: LM 30d, LAD 19m OM 50, RCA 100 w/ L->R collats, nl LV ->Med Rx;  b. 07/2010 MV: no ischemia.  .[name] KitchenB12 deficiency   . Osteoarthritis   . Type II diabetes mellitus   . Myocardial infarction 1999    "heart occasion" (03/15/2014)  . Pneumonia 1970's  . Anemia     Past Surgical History  Procedure Laterality Date  . Subtotal [facility]  2005    [facility] . Knee arthroscopy Bilateral 1990's  . Hernia repair Right ~ 2006    "abdomen; was as a result of a  surgery"  . Cardiac catheterization  11/15/1998  . Esophagogastroduodenoscopy N/A 03/16/2014    Procedure: ESOPHAGOGASTRODUODENOSCOPY (EGD);  Surgeon: JJerene Bears MD;  [facility];  Service: Endoscopy;  Laterality: N/A;  . Flexible sigmoidoscopy N/A 03/16/2014    Procedure: FLEXIBLE SIGMOIDOSCOPY;  Surgeon: JJerene Bears MD;  [facility];  Service: Endoscopy;  Laterality: N/A;     Discharge Condition: stable   Follow UP  Follow-up Information   Follow up with GElsie Stain MD. Schedule an appointment as soon as possible for a visit in 1 week.   Specialty:  Family Medicine   Contact information:   [place] [phone]      Follow up with MEilleen Kempf, MD. Schedule an appointment as soon as possible for a visit in 1 week.   Specialty:  Oncology   Contact information:   [phone]  Discharge Instructions  and  Discharge Medications          Discharge Orders   Future Appointments Provider Department Dept Phone   05/22/2014 2:45 PM [name], MD [facility] [phone]   05/29/2014 10:30 AM Jerene Bears, MD [facility] [phone]   Future Orders Complete By Expires   Discharge instructions  As directed    Scheduling Instructions:   Follow with Primary MD [name], MD in 7 days   Get CBC, CMP, checked 7 days by Primary MD and again as instructed by your Primary MD. Get a 2 view Chest X ray done next visit if you had Pneumonia of Lung problems at the Hospital.   Activity: As tolerated with Full fall precautions use walker/cane & assistance as needed   Disposition Home     Diet: Heart Healthy  Low Carb  For Heart failure patients - Check your Weight same time everyday, if you gain over 2 pounds, or you develop in leg swelling, experience more shortness of breath or chest pain, call your Primary MD  immediately. Follow Cardiac Low Salt Diet and 1.8 lit/day fluid restriction.   On your next visit with her primary care physician please Get Medicines reviewed and adjusted.  Please request your Prim.MD to go over all Hospital Tests and Procedure/Radiological results at the follow up, please get all Hospital records sent to your Prim MD by signing hospital release before you go home.   If you experience worsening of your admission symptoms, develop shortness of breath, life threatening emergency, suicidal or homicidal thoughts you must seek medical attention immediately by calling 911 or calling your MD immediately  if symptoms less severe.  You Must read complete instructions/literature along with all the possible adverse reactions/side effects for all the Medicines you take and that have been prescribed to you. Take any new Medicines after you have completely understood and accpet all the possible adverse reactions/side effects.   Do not drive and provide baby sitting services if your were admitted for syncope or siezures until you have seen by Primary MD or a Neurologist and advised to do so again.  Do not drive when taking Pain medications.    Do not take more than prescribed Pain, Sleep and Anxiety Medications  Special Instructions: If you have smoked or chewed Tobacco  in the last 2 yrs please stop smoking, stop any regular Alcohol  and or any Recreational drug use.  Wear Seat belts while driving.   Please note  You were cared for by a hospitalist during your hospital stay. If you have any questions about your discharge medications or the care you received while you were in the hospital after you are discharged, you can call the unit and asked to speak with the hospitalist on call if the hospitalist that took care of you is not available. Once you are discharged, your primary care physician will handle any further medical issues. Please note that NO REFILLS for any discharge  medications will be authorized once you are discharged, as it is imperative that you return to your primary care physician (or establish a relationship with a primary care physician if you do not have one) for your aftercare needs so that they can reassess your need for medications and monitor your lab values.   Increase activity slowly  As directed        Medication List    STOP taking these medications       clarithromycin  500 MG tablet  Commonly known as:  BIAXIN     lisinopril-hydrochlorothiazide 20-12.5 MG per tablet  Commonly known as:  PRINZIDE,ZESTORETIC      TAKE these medications       cyanocobalamin 1000 MCG/ML injection  Commonly known as:  (VITAMIN B-12)  Inject 1,000 mcg into the muscle every 30 (thirty) days.     cyclobenzaprine 5 MG tablet  Commonly known as:  FLEXERIL  Take 1 tablet (5 mg total) by mouth 3 (three) times daily as needed for muscle spasms.     glipiZIDE 10 MG 24 hr tablet  Commonly known as:  GLUCOTROL XL  Take 10 mg by mouth daily.     isosorbide mononitrate 60 MG 24 hr tablet  Commonly known as:  IMDUR  Take 1 tablet (60 mg total) by mouth daily.     metFORMIN 1000 MG tablet  Commonly known as:  GLUCOPHAGE  Take 1 tablet (1,000 mg total) by mouth 2 (two) times daily.  Start taking on:  04/09/2014     metoprolol succinate 25 MG 24 hr tablet  Commonly known as:  TOPROL-XL  Take 25-75 mg by mouth daily.     metroNIDAZOLE 250 MG tablet  Commonly known as:  FLAGYL  Take 250 mg by mouth 4 (four) times daily.     minocycline 50 MG capsule  Commonly known as:  MINOCIN,DYNACIN  Take 50 mg by mouth daily.     MUSCLE RUB 10-15 % Crea  Apply 1 application topically as needed for muscle pain.     NEEDLE (DISP) 25 G 25G X 5/8" Misc  Commonly known as:  BD DISP NEEDLES  25 g by Does not apply route every 30 (thirty) days.     nitroGLYCERIN 0.4 MG SL tablet  Commonly known as:  NITROSTAT  Place 0.4 mg under the tongue every 5 (five) minutes  as needed for chest pain.     NON FORMULARY  Take 1 tablet by mouth daily. Nattokinase Complex one tab daily     NON FORMULARY  Take 12 mg by mouth daily. Astaxanthin     NON FORMULARY  Take 1 tablet by mouth daily. Diabetes Multi Vitamin and Minerals one tablet daily     omeprazole 20 MG capsule  Commonly known as:  PRILOSEC  Take 20 mg by mouth 2 (two) times daily before a meal.     OVER THE COUNTER MEDICATION  Apply topically daily as needed.     pantoprazole 40 MG tablet  Commonly known as:  PROTONIX  Take 1 tablet (40 mg total) by mouth daily.     Rivaroxaban 15 MG Tabs tablet  Commonly known as:  XARELTO  Take 1 tablet (15 mg total) by mouth 2 (two) times daily with a meal. Get dose adjusted by PCP in 2 weeks     Syringe (Disposable) 1 ML Misc  1 mL by Does not apply route every 30 (thirty) days.     traMADol 50 MG tablet  Commonly known as:  ULTRAM  Take 100 mg by mouth 3 (three) times daily.     VSL#3 Caps  Take 1 capsule by mouth daily.     zolpidem 10 MG tablet  Commonly known as:  AMBIEN  Take 10 mg by mouth at bedtime.          Diet and Activity recommendation: See Discharge Instructions above   Consults obtained -     Major procedures and Radiology Reports - PLEASE review detailed  and final reports for all details, in brief -       Ct Abdomen Pelvis Wo Contrast  04/05/2014   CLINICAL DATA:  Left flank pain.  EXAM: CT ABDOMEN AND PELVIS WITHOUT CONTRAST  TECHNIQUE: Multidetector CT imaging of the abdomen and pelvis was performed following the standard protocol without intravenous contrast.  COMPARISON:  None.  FINDINGS: BODY WALL: Supra and infraumbilical midline abdominal wall hernias containing nonobstructed bowel. There is a hernia within the right lower quadrant which traverses the rectus abdominus, and may be a remote stomal hernia. This hernia contains small bowel with enteroenterostomy, nonobstructed.  LOWER CHEST: Multi focal coronary artery  atherosclerosis. No cardiomegaly. No lower lung consolidation.  ABDOMEN/PELVIS:  Liver: No focal abnormality.  Biliary: Cholelithiasis.  No gallbladder distention or inflammation.  Pancreas: Unremarkable.  Spleen: Unremarkable.  Adrenals: Unremarkable.  Kidneys and ureters: Symmetric fullness of the renal collecting system, without hydronephrosis. Exophytic water density mass from the interpolar right kidney, 41 mm in diameter, most consistent with cyst.  Bladder: There is minimal layering high density material within the right bladder lumen.  Reproductive: Unremarkable.  Bowel: Colectomy, likely with ileal pouch anal anastomosis. No bowel obstruction. No enteritis.  Retroperitoneum: No mass or adenopathy.  Peritoneum: No free fluid or gas.  Vascular: No acute abnormality.  OSSEOUS: No acute abnormality. Left SI joint ankylosis is likely degenerative given bony productive change.  IMPRESSION: 1. Layering calcification in the bladder may reflect recently passed calculi. No hydronephrosis or ureteral calculus. 2. Three abdominal wall hernias containing nonobstructed small bowel, as above. 3. Cholelithiasis without acute cholecystitis.   Electronically Signed   By: Jorje Guild M.D.   On: 04/05/2014 22:54   Dg Chest 2 View  03/12/2014   CLINICAL DATA:  Pre angio procedure.  Chest pain fatigue.  EXAM: CHEST  2 VIEW  COMPARISON:  None.  FINDINGS: Trachea is midline. Heart size normal. Mild scarring at the lung bases. Lungs are otherwise clear. No pleural fluid. Degenerative changes are seen in the spine. Mild compression of a mid thoracic vertebral body is age indeterminate.  IMPRESSION: 1. No acute findings. 2. Mild compression of a mid thoracic vertebral body is age indeterminate.   Electronically Signed   By: Lorin Picket M.D.   On: 03/12/2014 14:12   Dg Cervical Spine Complete  03/29/2014   CLINICAL DATA:  Posterior neck pain radiating into both shoulders.  EXAM: CERVICAL SPINE  4+ VIEWS  COMPARISON:   None.  FINDINGS: There is moderate degenerative disc disease at C5-6 and C6-7 and C7-T1. There is moderate left facet arthritis at C4-5. There is slight left foraminal stenosis at C5-6.  No fracture or subluxation or prevertebral soft tissue swelling. Small old benign calcifications in the nuchal ligament at C5-6.  IMPRESSION: No acute abnormality. Degenerative disc disease in the lower cervical spine.   Electronically Signed   By: Rozetta Nunnery M.D.   On: 03/29/2014 19:51   Dg Lumbar Spine Complete  04/05/2014   CLINICAL DATA:  Pain for 2 weeks in the lower back  EXAM: LUMBAR SPINE - COMPLETE 4+ VIEW  COMPARISON:  DG CHEST 2 VIEW dated 03/12/2014  FINDINGS: There are 5 nonrib bearing lumbar-type vertebral bodies. There is an age-indeterminate T12 anterior vertebral body compression deformity with approximately 15% height loss. The alignment is anatomic. There is no spondylolysis. There is no acute fracture or static listhesis. There is minimal degenerative disc disease at L4-5 and L5-S1 with bilateral facet arthropathy.  The SI joints  are unremarkable.  There is abdominal aortic atherosclerosis.  IMPRESSION: There is an age-indeterminate T12 anterior vertebral body compression deformity with approximately 15% height loss. The abnormality appears to be new compared with the chest x-ray performed on 03/12/2014. If there is further clinical concern regarding the acuity of the fracture, further evaluation with bone scan or MRI is recommended.  Lumbar spine spondylosis at L4-5 and L5-S1.   Electronically Signed   By: Kathreen Devoid   On: 04/05/2014 13:08   Mr Mra Chest Wo Contrast  04/06/2014   CLINICAL DATA:  Chest pain, hypertension and renal insufficiency.  EXAM: MRA CHEST WITH OR WITHOUT CONTRAST  TECHNIQUE: Angiographic images of the chest were obtained using MRA technique without intravenous contrast.  CONTRAST:  No contrast was administered due to significant renal insufficiency.  COMPARISON:  None.  FINDINGS:  The thoracic aorta is of normal caliber demonstrates no evidence of aneurysmal disease or dissection. No intramural hemorrhage is identified. Proximal great vessels show normal branching anatomy. No masses or other mediastinal abnormalities are visualized. No evidence of enlarged lymph nodes.  No pleural or pericardial fluid is identified. Visualized bony structures are unremarkable.  IMPRESSION: Normal caliber thoracic aorta without evidence of dissection or intramural hemorrhage.   Electronically Signed   By: Aletta Edouard M.D.   On: 04/06/2014 08:26   Mr Jodene Nam Pelvis Wo Contrast  04/06/2014   CLINICAL DATA:  Hypotension and groin pain radiating into back.  EXAM: MRA ABDOMEN AND PELVIS WITHOUT CONTRAST  TECHNIQUE: Multiplanar, multiecho pulse sequences of the abdomen and pelvis were obtained without intravenous contrast. Angiographic images of abdomen and pelvis were obtained using unenhanced MRA technique.  CONTRAST:  None  COMPARISON:  None.  FINDINGS: MRA ABDOMEN FINDINGS  The abdominal aorta is of normal caliber. Visualized proximal segments of the celiac axis, superior mesenteric artery, bilateral single renal arteries in the inferior mesenteric artery show normal patency. There is no evidence of retroperitoneal or intraperitoneal hemorrhage.  Unenhanced appearance of the solid organs of the abdomen is unremarkable. A cyst of the posterior right kidney appears likely benign. No masses or enlarged lymph nodes are identified. Visualized bowel is unremarkable.  MRA PELVIS FINDINGS  Pelvic vessels are unremarkable. There is no evidence of hemorrhage. A lower abdominal wall hernia is present to the right of midline containing bowel. No abnormal fluid is seen in the hernia. There also appears to be in additional lower midline hernia.  IMPRESSION: No evidence of aortic aneurysm or dissection. No additional significant findings in the abdomen or pelvis.   Electronically Signed   By: Aletta Edouard M.D.   On:  04/06/2014 08:47   Mr Jodene Nam Abdomen Wo Contrast  04/06/2014   CLINICAL DATA:  Hypotension and groin pain radiating into back.  EXAM: MRA ABDOMEN AND PELVIS WITHOUT CONTRAST  TECHNIQUE: Multiplanar, multiecho pulse sequences of the abdomen and pelvis were obtained without intravenous contrast. Angiographic images of abdomen and pelvis were obtained using unenhanced MRA technique.  CONTRAST:  None  COMPARISON:  None.  FINDINGS: MRA ABDOMEN FINDINGS  The abdominal aorta is of normal caliber. Visualized proximal segments of the celiac axis, superior mesenteric artery, bilateral single renal arteries in the inferior mesenteric artery show normal patency. There is no evidence of retroperitoneal or intraperitoneal hemorrhage.  Unenhanced appearance of the solid organs of the abdomen is unremarkable. A cyst of the posterior right kidney appears likely benign. No masses or enlarged lymph nodes are identified. Visualized bowel is unremarkable.  MRA PELVIS FINDINGS  Pelvic vessels are unremarkable. There is no evidence of hemorrhage. A lower abdominal wall hernia is present to the right of midline containing bowel. No abnormal fluid is seen in the hernia. There also appears to be in additional lower midline hernia.  IMPRESSION: No evidence of aortic aneurysm or dissection. No additional significant findings in the abdomen or pelvis.   Electronically Signed   By: Aletta Edouard M.D.   On: 04/06/2014 08:47    Micro Results      Recent Results (from the past 240 hour(s))  URINE CULTURE     Status: None   Collection Time    04/06/14  2:04 AM      Result Value Ref Range Status   Specimen Description URINE, RANDOM   Final   Special Requests NONE   Final   Culture  Setup Time     Final   Value: 04/06/2014 03:31     Performed at [place]     Final   Value: 9,000 COLONIES/ML     Performed at [facility]   Culture     Final   Value: INSIGNIFICANT GROWTH     Performed at [facility]   Report Status 04/07/2014 FINAL   Final     History of present illness and  Hospital Course:     Kindly see H&P for history of present illness and admission details, please review complete Labs, Consult reports and Test reports for all details in brief MISHAEL HARAN, is a 68 y.o. male, patient with history of  HTN, HLD, DM, UC status post colectomy and J pouch in 2005, presenting to [facility] ED with main concern of several days duration of progressively worsening left groin area pain, constant and throbbing.   His workup was consistent with large left leg DVT extending from left common peroneal vein, femoral, popliteal, anemias, posterior tibial but there was no evidence of Baker's cyst. 24 hours of tobacco admission his pain and discomfort is much improved. Will place him on xaralto with close outpatient followup with PCP and outpatient hematology followup one time as to the etiology of extensive blood clots. Question of ulcerative colitis is acting as a hypercoagulable state for this patient.    Acute renal failure. Patient presented with acute renal failure he was noted to be on comminution of lisinopril as CBC, this medication was held he was given IV fluids and renal failure completely resolved, upon discharge am holding his lisinopril HCTZ combination. We'll request PCP to monitor his blood pressure and titrate blood pressure medications as appropriate.   Diabetes mellitus type 2. Home regimen will be started unchanged except Glucophage will be held for 48 hours as during his pain workup he underwent MRA with IV contrast. Of note his MRA was unremarkable this was done to rule out dissection of his aorta in the ER. Currently she reports for all details and incidental findings.    T12 vertebral compression deformity. Likely chronic. We'll request PCP to monitor, if back pain becomes an issue consider further imaging of that area.   Straight of ulcerative colitis no acute  issues.     Today   Subjective:   Fender Herder today has no headache,no chest abdominal pain,no new weakness tingling or numbness, feels much better wants to go home today.    Objective:   Blood pressure 141/80, pulse 89, temperature 98.7 F (37.1 C), temperature source Oral, resp. rate 19, height 5' 7"  (1.702  m), weight 91.5 kg (201 lb 11.5 oz), SpO2 100.00%.   Intake/Output Summary (Last 24 hours) at 04/07/14 0749 Last data filed at 04/07/14 0718  Gross per 24 hour  Intake 1721.25 ml  Output    380 ml  Net 1341.25 ml    Exam Awake Alert, Oriented *3, No new F.N deficits, Normal affect [place].AT,PERRAL Supple Neck,No JVD, No cervical lymphadenopathy appriciated.  Symmetrical Chest wall movement, Good air movement bilaterally, CTAB RRR,No Gallops,Rubs or new Murmurs, No Parasternal Heave +ve B.Sounds, Abd Soft, Non tender, No organomegaly appriciated, No rebound -guarding or rigidity. No Cyanosis, Clubbing or edema, No new Rash or bruise  Data Review   CBC w Diff: Lab Results  Component Value Date   WBC 6.2 04/06/2014   HGB 10.0* 04/06/2014   HCT 32.9* 04/06/2014   PLT 175 04/06/2014   LYMPHOPCT 15 04/05/2014   MONOPCT 11 04/05/2014   EOSPCT 0 04/05/2014   BASOPCT 0 04/05/2014    CMP: Lab Results  Component Value Date   NA 141 04/06/2014   K 3.8 04/06/2014   CL 103 04/06/2014   CO2 22 04/06/2014   BUN 31* 04/06/2014   CREATININE 1.27 04/06/2014   CREATININE 1.01 03/12/2014   PROT 8.2 04/05/2014   ALBUMIN 3.6 04/05/2014   BILITOT 0.4 04/05/2014   ALKPHOS 46 04/05/2014   AST 16 04/05/2014   ALT 9 04/05/2014  .   Total Time in preparing paper work, data evaluation and todays exam - 35 minutes  Thurnell Lose M.D on 04/07/2014 at 7:49 AM  [facility] Office  [phone]

## 2014-04-07 NOTE — Progress Notes (Signed)
Physical Therapy Treatment Patient Details Name: Manuel Grant MRN: [id number] DOB: 1946-04-13 Today's Date: 04/07/2014    History of Present Illness 68 y.o. male with HTN, HLD, DM, UC status post colectomy and J pouch in 2005, presenting to [facility] ED with main concern of several days duration of progressively worsening left groin area pain, constant and throbbing, 7/10 in severity, radiating to the left medial thigh and left calf, down to the foot, no specific alleviating factors, worse with movement and weight bearing. Found to have LLE DVT    PT Comments    Pt with excellent mobility with pain 5/10 localized to posterior patella and lumbar MRI cancelled. Pt with return to normal function with gait and transfers without further therapy needs at this time. Pt educated for back precautions with mobility to prevent the pinching/radiating pain he experienced a few days ago which is described as a lumbar nerve impingement symptom. Pt and wife express understanding of all education and agree he is back to baseline without further needs. Will sign off.   Follow Up Recommendations  No PT follow up     Equipment Recommendations  None recommended by PT    Recommendations for Other Services       Precautions / Restrictions Precautions Precautions: None    Mobility  Bed Mobility Overal bed mobility: Modified Independent                Transfers Overall transfer level: Modified independent                  Ambulation/Gait Ambulation/Gait assistance: Independent Ambulation Distance (Feet): 300 Feet Assistive device: None Gait Pattern/deviations: WFL(Within Functional Limits)   Gait velocity interpretation: at or above normal speed for age/gender General Gait Details: equal gait with normal BOS   Stairs Stairs: Yes Stairs assistance: Modified independent (Device/Increase time) Stair Management: One rail Right;Forwards;Alternating pattern Number of Stairs: 6     Wheelchair Mobility    Modified Rankin (Stroke Patients Only)       Balance                                    Cognition Arousal/Alertness: Awake/alert Behavior During Therapy: WFL for tasks assessed/performed Overall Cognitive Status: Within Functional Limits for tasks assessed                      Exercises      General Comments        Pertinent Vitals/Pain 5/10 left posterior knee pain VSS    Home Living                      Prior Function            PT Goals (current goals can now be found in the care plan section) Progress towards PT goals: Goals met/education completed, patient discharged from PT    Frequency       PT Plan Discharge plan needs to be updated    Co-evaluation             End of Session   Activity Tolerance: Patient tolerated treatment well Patient left: in chair;with call bell/phone within reach;with family/visitor present     Time: 3007-6226 PT Time Calculation (min): 13 min  Charges:  $Gait Training: 8-22 mins  G Codes:      Rowe Warman B Jedrick Hutcherson 04/07/2014, 11:11 AM Elwyn Reach, [place]

## 2014-04-09 ENCOUNTER — Other Ambulatory Visit: Payer: Self-pay | Admitting: Family Medicine

## 2014-04-18 ENCOUNTER — Encounter: Payer: Self-pay | Admitting: Family Medicine

## 2014-04-18 ENCOUNTER — Ambulatory Visit (INDEPENDENT_AMBULATORY_CARE_PROVIDER_SITE_OTHER): Payer: Commercial Managed Care - HMO | Admitting: Family Medicine

## 2014-04-18 VITALS — BP 100/70 | HR 104 | Temp 98.4°F | Wt 195.5 lb

## 2014-04-18 DIAGNOSIS — R7989 Other specified abnormal findings of blood chemistry: Secondary | ICD-10-CM

## 2014-04-18 DIAGNOSIS — N179 Acute kidney failure, unspecified: Secondary | ICD-10-CM

## 2014-04-18 DIAGNOSIS — K269 Duodenal ulcer, unspecified as acute or chronic, without hemorrhage or perforation: Secondary | ICD-10-CM

## 2014-04-18 DIAGNOSIS — I82409 Acute embolism and thrombosis of unspecified deep veins of unspecified lower extremity: Secondary | ICD-10-CM

## 2014-04-18 MED ORDER — LISINOPRIL-HYDROCHLOROTHIAZIDE 20-12.5 MG PO TABS: 2.0000 | ORAL_TABLET | Freq: Every day | ORAL | Status: DC

## 2014-04-18 NOTE — Progress Notes (Signed)
Pre visit review using our clinic review tool, if applicable. No additional management support is needed unless otherwise documented below in the visit note.  Hospital f/u.  Was anemic and transfused.  Has GI f/u pending.  F/u labs pending.  No other bleeding per patient in the meantime. No abd pain.   DVT in L leg. Single episode.  On xarelto currently.  No FH.  No prior hx.  No clear trigger.  Discussed with patient about having heme f/u. On anticoagulation, will need PA done on xarelto. Leg pain much improved.    ARF during hospitalization.  Back on lisinopril in the meantime.  D/w pt.  Asked him to hold it for now.  F/u labs pending.  No BLE edema. BP isn't high.   Meds, vitals, and allergies reviewed.   ROS: See HPI.  Otherwise, noncontributory.  nad ncat Mmm rrr ctab abd soft Ext without edema

## 2014-04-18 NOTE — Patient Instructions (Addendum)
Don't take the lisinopril tomorrow (at least until you hear from [place]).   Call about the appointment with Dr. Julien Nordmann.   Go to the lab on the way out.  We'll contact you with your lab report. I'll work on the [employer] in the meantime. Take care.

## 2014-04-19 ENCOUNTER — Other Ambulatory Visit: Payer: Self-pay | Admitting: Family Medicine

## 2014-04-19 LAB — COMPREHENSIVE METABOLIC PANEL
ALBUMIN: 3.6 g/dL (ref 3.5–5.2)
ALT: 13 U/L (ref 0–53)
AST: 23 U/L (ref 0–37)
Alkaline Phosphatase: 48 U/L (ref 39–117)
BUN: 37 mg/dL — AB (ref 6–23)
CALCIUM: 9.7 mg/dL (ref 8.4–10.5)
CO2: 27 mEq/L (ref 19–32)
Chloride: 102 mEq/L (ref 96–112)
Creatinine, Ser: 1.4 mg/dL (ref 0.4–1.5)
GFR: 55.21 mL/min — ABNORMAL LOW (ref >60.00)
GLUCOSE: 96 mg/dL (ref 70–99)
POTASSIUM: 4.2 meq/L (ref 3.5–5.1)
Sodium: 138 mEq/L (ref 135–145)
Total Bilirubin: 0.3 mg/dL (ref 0.3–1.2)
Total Protein: 8.2 g/dL (ref 6.0–8.3)

## 2014-04-19 LAB — CBC WITH DIFFERENTIAL/PLATELET
Basophils Absolute: 0 10*3/uL (ref 0.0–0.1)
Basophils Relative: 0.4 % (ref 0.0–3.0)
Eosinophils Absolute: 0.1 10*3/uL (ref 0.0–0.7)
Eosinophils Relative: 1.8 % (ref 0.0–5.0)
HCT: 34.8 % — ABNORMAL LOW (ref 39.0–52.0)
Hemoglobin: 11 g/dL — ABNORMAL LOW (ref 13.0–17.0)
Lymphocytes Relative: 31.8 % (ref 12.0–46.0)
Lymphs Abs: 2.1 10*3/uL (ref 0.7–4.0)
MCHC: 31.6 g/dL (ref 30.0–36.0)
MCV: 69.4 fl — ABNORMAL LOW (ref 78.0–100.0)
MONOS PCT: 8.4 % (ref 3.0–12.0)
Monocytes Absolute: 0.6 10*3/uL (ref 0.1–1.0)
Neutro Abs: 3.8 10*3/uL (ref 1.4–7.7)
Neutrophils Relative %: 57.6 % (ref 43.0–77.0)
PLATELETS: 309 10*3/uL (ref 150.0–400.0)
RBC: 5.01 Mil/uL (ref 4.22–5.81)
RDW: 34.3 % — AB (ref 11.5–14.6)
WBC: 6.7 10*3/uL (ref 4.5–10.5)

## 2014-04-19 MED ORDER — RIVAROXABAN 20 MG PO TABS: 20.0000 mg | ORAL_TABLET | Freq: Every day | ORAL | Status: DC

## 2014-04-19 MED ORDER — LISINOPRIL-HYDROCHLOROTHIAZIDE 20-12.5 MG PO TABS: 1.0000 | ORAL_TABLET | Freq: Every day | ORAL | Status: DC

## 2014-04-19 NOTE — Assessment & Plan Note (Signed)
Single episode. On xarelto currently, we'll work on [place]. No FH. No prior hx. No clear trigger. Discussed with patient about having heme f/u.  Leg pain much improved.

## 2014-04-19 NOTE — Assessment & Plan Note (Signed)
Was anemic and transfused. Has GI f/u pending. F/u labs pending. No other bleeding per patient in the meantime. See notes on labs.

## 2014-04-19 NOTE — Assessment & Plan Note (Addendum)
Hold lisinopril for now.  See notes on labs. >25 minutes spent in face to face time with patient, >50% spent in counselling or coordination of care.

## 2014-04-23 ENCOUNTER — Telehealth: Payer: Self-pay | Admitting: Internal Medicine

## 2014-04-23 NOTE — Telephone Encounter (Signed)
S/W PATIENT AND GVE NP APPT FOR 05/04 @1 :30 W/DR. MOHAMED.  REFERRING- ED REFERRAL DX- DVT WELCOME PACKET MAILED.

## 2014-04-23 NOTE — Telephone Encounter (Signed)
C/D 04/23/14 for appt. 04/30/14

## 2014-04-30 ENCOUNTER — Other Ambulatory Visit: Payer: Commercial Managed Care - HMO

## 2014-04-30 ENCOUNTER — Ambulatory Visit (HOSPITAL_BASED_OUTPATIENT_CLINIC_OR_DEPARTMENT_OTHER): Payer: Commercial Managed Care - HMO

## 2014-04-30 ENCOUNTER — Telehealth: Payer: Self-pay | Admitting: Internal Medicine

## 2014-04-30 ENCOUNTER — Ambulatory Visit (HOSPITAL_BASED_OUTPATIENT_CLINIC_OR_DEPARTMENT_OTHER): Payer: Commercial Managed Care - HMO | Admitting: Internal Medicine

## 2014-04-30 ENCOUNTER — Other Ambulatory Visit: Payer: Self-pay | Admitting: *Deleted

## 2014-04-30 ENCOUNTER — Encounter: Payer: Self-pay | Admitting: Internal Medicine

## 2014-04-30 VITALS — BP 137/84 | HR 75 | Temp 98.3°F | Resp 18 | Ht 67.0 in | Wt 198.1 lb

## 2014-04-30 DIAGNOSIS — I82409 Acute embolism and thrombosis of unspecified deep veins of unspecified lower extremity: Secondary | ICD-10-CM

## 2014-04-30 DIAGNOSIS — D509 Iron deficiency anemia, unspecified: Secondary | ICD-10-CM

## 2014-04-30 LAB — CBC WITH DIFFERENTIAL/PLATELET
BASO%: 0.4 % (ref 0.0–2.0)
Basophils Absolute: 0 10*3/uL (ref 0.0–0.1)
EOS%: 2.7 % (ref 0.0–7.0)
Eosinophils Absolute: 0.1 10*3/uL (ref 0.0–0.5)
HEMATOCRIT: 35.4 % — AB (ref 38.4–49.9)
HGB: 10.8 g/dL — ABNORMAL LOW (ref 13.0–17.1)
LYMPH%: 35.9 % (ref 14.0–49.0)
MCH: 22.7 pg — ABNORMAL LOW (ref 27.2–33.4)
MCHC: 30.5 g/dL — ABNORMAL LOW (ref 32.0–36.0)
MCV: 74.4 fL — ABNORMAL LOW (ref 79.3–98.0)
MONO#: 0.5 10*3/uL (ref 0.1–0.9)
MONO%: 8.9 % (ref 0.0–14.0)
NEUT%: 52.1 % (ref 39.0–75.0)
NEUTROS ABS: 2.7 10*3/uL (ref 1.5–6.5)
Platelets: 180 10*3/uL (ref 140–400)
RBC: 4.76 10*6/uL (ref 4.20–5.82)
RDW: 30.2 % — ABNORMAL HIGH (ref 11.0–14.6)
WBC: 5.2 10*3/uL (ref 4.0–10.3)
lymph#: 1.9 10*3/uL (ref 0.9–3.3)
nRBC: 0 % (ref 0–0)

## 2014-04-30 NOTE — Progress Notes (Signed)
Checked in new patient with no financial issues.

## 2014-04-30 NOTE — Progress Notes (Signed)
[facility] [phone]   [phone]  CONSULT NOTE  REFERRING PHYSICIAN: Dr. [name]  REASON FOR CONSULTATION:  68 years old white male recently diagnosed with deep venous thrombosis.  HPI Manuel Grant is a 68 y.o. male with a past medical history significant for multiple medical problems including ulcerative colitis status post subtotal colectomy with J-pouch, history of chronic kidney disease, renal stones, hypertension, dyslipidemia, coronary artery disease, diabetes mellitus and anemia. The patient mentions that in early April 2014 he noticed pain and swelling in the left knee and left groin area. He was evaluated at an urgent care center and sent to the emergency department. CT scan of the abdomen and pelvis performed on 04/05/2014 showed layering calcification in the bladder that may reflect recently passed calculi but no hydronephrosis or ureteral calculus. MRI of the chest performed on 04/06/2014 showed normal caliber thoracic aorta without evidence of dissection or intramural hemorrhage. MRI of the abdomen and pelvis showed no evidence of aortic aneurysm or dissection and no additional significant findings in the abdomen or pelvis. On 04/07/1999 team the patient had left lower extremity venous duplex that showed Findings consistent with acute deep vein thrombosis involving the left common femoral vein, left profunda femoris vein, left femoral vein, left popliteal vein, left posterial tibial vein, left peroneal vein, and left gastrocnemius vein. No evidence of deep vein thrombosis involving the right lower extremity. He was started on treatment with Xarelto and the patient was discharged from the hospital on 04/07/2014. He did not have any surgical intervention or travel history before his diagnosis with the deep venous thrombosis. He was in the hospital 2 weeks before with severe anemia and he received 2 units of PRBCs transfusion in addition to  2 doses of IV iron because of severe iron deficiency anemia. He did not have any hypercoagulable studies performed during his hospitalization before starting anticoagulation . He is here today for evaluation and discussion  The patient is feeling fine today except for occasional chest pain. He also has recent evaluation by gastroenterology Dr. Hilarie Fredrickson that showed mild reflux esophagitis was mild erosive gastritis found in the gastric body and gastric antrum. There was too small nonbleeding ulcers found in the duodenal bulb. He still has flexible sigmoidoscopy that showed status post subtotal colectomy with small rectal stump and an anastomotic ulcer was visibly stable as there was distal ileum with a small ulcer and mild anal stenosis with anal canal polyp. The patient was started on treatment with Protonix 40 mg by mouth daily.  He denied having any significant shortness of breath, cough or hemoptysis. He has no nausea or vomiting, no weight loss or night sweats. He is to have swelling of the left lower extremity but much improved compared to few weeks ago  He completed 3 weeks of Xarelto 15 mg by mouth twice a day and yesterday started the first dose of 20 mg by mouth daily.  Family history significant for mother with diabetes mellitus and heart disease and father with COPD. The patient denied any family history of deep venous thrombosis or pulmonary embolism. He has a sister who has Crohn's disease. The patient is married and has 2 children. He used to work in [employer] work. He has a history of smoking but quit in 1976. He drinks alcohol occasionally and no history of drug abuse. HPI  Past Medical History  Diagnosis Date  . Ulcerative colitis     s/p colectomy ~ 2005  at [facility]  . Renal stones     uric acid stones  . Chronic kidney disease   . Hypertension   . Hyperlipidemia   . Rosacea   . Insomnia   . Hiatal hernia   . Arthritis     Knee OA- per [facility]- injected prev by ortho  .  Coronary artery disease     a. 10/1998 Cath: LM 30d, LAD 50m OM 50, RCA 100 w/ L->R collats, nl LV ->Med Rx;  b. 07/2010 MV: no ischemia.  .[name] KitchenB12 deficiency   . Osteoarthritis   . Type II diabetes mellitus   . Myocardial infarction 1999    "heart occasion" (03/15/2014)  . Pneumonia 1970's  . Anemia     Past Surgical History  Procedure Laterality Date  . Subtotal colectomy  2005    [facility] . Knee arthroscopy Bilateral 1990's  . Hernia repair Right ~ 2006    "abdomen; was as a result of a surgery"  . Cardiac catheterization  11/15/1998  . Esophagogastroduodenoscopy N/A 03/16/2014    Procedure: ESOPHAGOGASTRODUODENOSCOPY (EGD);  Surgeon: JJerene Bears MD;  [facility];  Service: Endoscopy;  Laterality: N/A;  . Flexible sigmoidoscopy N/A 03/16/2014    Procedure: FLEXIBLE SIGMOIDOSCOPY;  Surgeon: JJerene Bears MD;  [facility];  Service: Endoscopy;  Laterality: N/A;    Family History  Problem Relation Age of Onset  . Heart disease Mother   . Diabetes Mother   . COPD Father   . Colon cancer Neg Hx   . Prostate cancer Neg Hx     Social History History  Substance Use Topics  . Smoking status: Former Smoker -- 2.00 packs/day for 10 years    Types: Cigarettes    Quit date: 12/28/1974  . Smokeless tobacco: Never Used  . Alcohol Use: Yes     Comment: 03/15/2014 "6 pack beer or less/yr"    No Known Allergies  Current Outpatient Prescriptions  Medication Sig Dispense Refill  . cyanocobalamin (,VITAMIN B-12,) 1000 MCG/ML injection Inject 1,000 mcg into the muscle every 30 (thirty) days.      .[name] KitchenglipiZIDE (GLUCOTROL XL) 10 MG 24 hr tablet Take 10 mg by mouth daily.      . isosorbide mononitrate (IMDUR) 60 MG 24 hr tablet Take 1 tablet (60 mg total) by mouth daily.  30 tablet  5  . lisinopril-hydrochlorothiazide (PRINZIDE,ZESTORETIC) 20-12.5 MG per tablet Take 1 tablet by mouth daily.      . metFORMIN (GLUCOPHAGE) 1000 MG tablet Take 1 tablet  (1,000 mg total) by mouth 2 (two) times daily.      . metoprolol succinate (TOPROL-XL) 25 MG 24 hr tablet Take 25 mg by mouth 2 (two) times daily.       .[name] KitchenNEEDLE, DISP, 25 G (BD DISP NEEDLES) 25G X 5/8" MISC 25 g by Does not apply route every 30 (thirty) days.  12 each  0  . nitroGLYCERIN (NITROSTAT) 0.4 MG SL tablet Place 0.4 mg under the tongue every 5 (five) minutes as needed for chest pain.      . NON FORMULARY Take 1 tablet by mouth daily. Nattokinase Complex one tab daily      . NON FORMULARY Take 12 mg by mouth daily. Astaxanthin      . NON FORMULARY Take 1 tablet by mouth daily. Diabetes Multi Vitamin and Minerals one tablet daily      . pantoprazole (PROTONIX) 40 MG tablet Take 1 tablet (  40 mg total) by mouth daily.  30 tablet  5  . Probiotic Product (VSL#3) CAPS Take 1 capsule by mouth daily.  30 capsule  6  . Rivaroxaban (XARELTO) 20 MG TABS tablet Take 1 tablet (20 mg total) by mouth daily with supper.  90 tablet  1  . traMADol (ULTRAM) 50 MG tablet Take 100 mg by mouth 3 (three) times daily.       [name] zolpidem (AMBIEN) 10 MG tablet Take 10 mg by mouth at bedtime.       No current facility-administered medications for this visit.    Review of Systems  Constitutional: negative Eyes: negative Ears, nose, mouth, throat, and face: negative Respiratory: positive for pleurisy/chest pain Cardiovascular: negative Gastrointestinal: negative Genitourinary:negative Integument/breast: negative Hematologic/lymphatic: positive for Swelling of the left lower extremity Musculoskeletal:negative Neurological: negative Behavioral/Psych: negative Endocrine: negative Allergic/Immunologic: negative  Physical Exam  OZD:GUYQI, healthy, no distress, well nourished and well developed SKIN: skin color, texture, turgor are normal, no rashes or significant lesions HEAD: Normocephalic, No masses, lesions, tenderness or abnormalities EYES: normal, PERRLA EARS: External ears normal, Canals  clear OROPHARYNX:no exudate, no erythema and lips, buccal mucosa, and tongue normal  NECK: supple, no adenopathy, no JVD LYMPH:  no palpable lymphadenopathy, no hepatosplenomegaly LUNGS: clear to auscultation , and palpation HEART: regular rate & rhythm, no murmurs and no gallops ABDOMEN:abdomen soft, non-tender, normal bowel sounds and no masses or organomegaly BACK: Back symmetric, no curvature., No CVA tenderness EXTREMITIES: 1+ edema in the left lower extremity  NEURO: gait normal  PERFORMANCE STATUS: ECOG 0  LABORATORY DATA: Lab Results  Component Value Date   WBC 6.7 04/18/2014   HGB 11.0* 04/18/2014   HCT 34.8* 04/18/2014   MCV 69.4 Repeated and verified X2.* 04/18/2014   PLT 309.0 04/18/2014      Chemistry      Component Value Date/Time   NA 138 04/18/2014 1653   K 4.2 04/18/2014 1653   CL 102 04/18/2014 1653   CO2 27 04/18/2014 1653   BUN 37* 04/18/2014 1653   CREATININE 1.4 04/18/2014 1653   CREATININE 1.01 03/12/2014 1246      Component Value Date/Time   CALCIUM 9.7 04/18/2014 1653   ALKPHOS 48 04/18/2014 1653   AST 23 04/18/2014 1653   ALT 13 04/18/2014 1653   BILITOT 0.3 04/18/2014 1653       RADIOGRAPHIC STUDIES: Ct Abdomen Pelvis Wo Contrast  04/05/2014   CLINICAL DATA:  Left flank pain.  EXAM: CT ABDOMEN AND PELVIS WITHOUT CONTRAST  TECHNIQUE: Multidetector CT imaging of the abdomen and pelvis was performed following the standard protocol without intravenous contrast.  COMPARISON:  None.  FINDINGS: BODY WALL: Supra and infraumbilical midline abdominal wall hernias containing nonobstructed bowel. There is a hernia within the right lower quadrant which traverses the rectus abdominus, and may be a remote stomal hernia. This hernia contains small bowel with enteroenterostomy, nonobstructed.  LOWER CHEST: Multi focal coronary artery atherosclerosis. No cardiomegaly. No lower lung consolidation.  ABDOMEN/PELVIS:  Liver: No focal abnormality.  Biliary: Cholelithiasis.  No  gallbladder distention or inflammation.  Pancreas: Unremarkable.  Spleen: Unremarkable.  Adrenals: Unremarkable.  Kidneys and ureters: Symmetric fullness of the renal collecting system, without hydronephrosis. Exophytic water density mass from the interpolar right kidney, 41 mm in diameter, most consistent with cyst.  Bladder: There is minimal layering high density material within the right bladder lumen.  Reproductive: Unremarkable.  Bowel: Colectomy, likely with ileal pouch anal anastomosis. No bowel obstruction. No enteritis.  Retroperitoneum: No mass or adenopathy.  Peritoneum: No free fluid or gas.  Vascular: No acute abnormality.  OSSEOUS: No acute abnormality. Left SI joint ankylosis is likely degenerative given bony productive change.  IMPRESSION: 1. Layering calcification in the bladder may reflect recently passed calculi. No hydronephrosis or ureteral calculus. 2. Three abdominal wall hernias containing nonobstructed small bowel, as above. 3. Cholelithiasis without acute cholecystitis.   Electronically Signed   By: Jorje Guild M.D.   On: 04/05/2014 22:54   Dg Lumbar Spine Complete  04/05/2014   CLINICAL DATA:  Pain for 2 weeks in the lower back  EXAM: LUMBAR SPINE - COMPLETE 4+ VIEW  COMPARISON:  DG CHEST 2 VIEW dated 03/12/2014  FINDINGS: There are 5 nonrib bearing lumbar-type vertebral bodies. There is an age-indeterminate T12 anterior vertebral body compression deformity with approximately 15% height loss. The alignment is anatomic. There is no spondylolysis. There is no acute fracture or static listhesis. There is minimal degenerative disc disease at L4-5 and L5-S1 with bilateral facet arthropathy.  The SI joints are unremarkable.  There is abdominal aortic atherosclerosis.  IMPRESSION: There is an age-indeterminate T12 anterior vertebral body compression deformity with approximately 15% height loss. The abnormality appears to be new compared with the chest x-ray performed on 03/12/2014. If there  is further clinical concern regarding the acuity of the fracture, further evaluation with bone scan or MRI is recommended.  Lumbar spine spondylosis at L4-5 and L5-S1.   Electronically Signed   By: Kathreen Devoid   On: 04/05/2014 13:08   Mr Mra Chest Wo Contrast  04/06/2014   CLINICAL DATA:  Chest pain, hypertension and renal insufficiency.  EXAM: MRA CHEST WITH OR WITHOUT CONTRAST  TECHNIQUE: Angiographic images of the chest were obtained using MRA technique without intravenous contrast.  CONTRAST:  No contrast was administered due to significant renal insufficiency.  COMPARISON:  None.  FINDINGS: The thoracic aorta is of normal caliber demonstrates no evidence of aneurysmal disease or dissection. No intramural hemorrhage is identified. Proximal great vessels show normal branching anatomy. No masses or other mediastinal abnormalities are visualized. No evidence of enlarged lymph nodes.  No pleural or pericardial fluid is identified. Visualized bony structures are unremarkable.  IMPRESSION: Normal caliber thoracic aorta without evidence of dissection or intramural hemorrhage.   Electronically Signed   By: Aletta Edouard M.D.   On: 04/06/2014 08:26   Mr Jodene Nam Pelvis Wo Contrast  04/06/2014   CLINICAL DATA:  Hypotension and groin pain radiating into back.  EXAM: MRA ABDOMEN AND PELVIS WITHOUT CONTRAST  TECHNIQUE: Multiplanar, multiecho pulse sequences of the abdomen and pelvis were obtained without intravenous contrast. Angiographic images of abdomen and pelvis were obtained using unenhanced MRA technique.  CONTRAST:  None  COMPARISON:  None.  FINDINGS: MRA ABDOMEN FINDINGS  The abdominal aorta is of normal caliber. Visualized proximal segments of the celiac axis, superior mesenteric artery, bilateral single renal arteries in the inferior mesenteric artery show normal patency. There is no evidence of retroperitoneal or intraperitoneal hemorrhage.  Unenhanced appearance of the solid organs of the abdomen is  unremarkable. A cyst of the posterior right kidney appears likely benign. No masses or enlarged lymph nodes are identified. Visualized bowel is unremarkable.  MRA PELVIS FINDINGS  Pelvic vessels are unremarkable. There is no evidence of hemorrhage. A lower abdominal wall hernia is present to the right of midline containing bowel. No abnormal fluid is seen in the hernia. There also appears to be in additional lower midline hernia.  IMPRESSION: No evidence of aortic aneurysm or dissection. No additional significant findings in the abdomen or pelvis.   Electronically Signed   By: Aletta Edouard M.D.   On: 04/06/2014 08:47   Mr Jodene Nam Abdomen Wo Contrast  04/06/2014   CLINICAL DATA:  Hypotension and groin pain radiating into back.  EXAM: MRA ABDOMEN AND PELVIS WITHOUT CONTRAST  TECHNIQUE: Multiplanar, multiecho pulse sequences of the abdomen and pelvis were obtained without intravenous contrast. Angiographic images of abdomen and pelvis were obtained using unenhanced MRA technique.  CONTRAST:  None  COMPARISON:  None.  FINDINGS: MRA ABDOMEN FINDINGS  The abdominal aorta is of normal caliber. Visualized proximal segments of the celiac axis, superior mesenteric artery, bilateral single renal arteries in the inferior mesenteric artery show normal patency. There is no evidence of retroperitoneal or intraperitoneal hemorrhage.  Unenhanced appearance of the solid organs of the abdomen is unremarkable. A cyst of the posterior right kidney appears likely benign. No masses or enlarged lymph nodes are identified. Visualized bowel is unremarkable.  MRA PELVIS FINDINGS  Pelvic vessels are unremarkable. There is no evidence of hemorrhage. A lower abdominal wall hernia is present to the right of midline containing bowel. No abnormal fluid is seen in the hernia. There also appears to be in additional lower midline hernia.  IMPRESSION: No evidence of aortic aneurysm or dissection. No additional significant findings in the abdomen or  pelvis.   Electronically Signed   By: Aletta Edouard M.D.   On: 04/06/2014 08:47    ASSESSMENT: This is a very pleasant 68 years old white male who was recently diagnosed with extensive deep venous thrombosis of the left lower extremity of unclear predisposing factors. The patient also has a history of iron deficiency anemia status post PRBCs transfusion as well as intravenous iron infusion.   PLAN: I have a lengthy discussion with the patient and his wife today about his current condition. I will order a hypercoagulable panel to rule out any genetic or acquired hypercoagulable abnormalities. I will also order repeat CBC and iron study for evaluation of his iron deficiency after receiving iron infusion. I advised the patient to continue Xarelto 20 mg by mouth daily for at least 6 months unless the patient has any evidence for genetic or acquired abnormalities then he will have to be on anticoagulation for life. I would see him back for followup visit in one month for reevaluation and discussion of his lab results and further recommendation regarding his condition.  The patient voices understanding of current disease status and treatment options and is in agreement with the current care plan.  All questions were answered. The patient knows to call the clinic with any problems, questions or concerns. We can certainly see the patient much sooner if necessary.  Thank you so much for allowing me to participate in the care of [place]. I will continue to follow up the patient with you and assist in his care.  I spent 40 minutes counseling the patient face to face. The total time spent in the appointment was 60 minutes.  Disclaimer: This note was dictated with voice recognition software. Similar sounding words can inadvertently be transcribed and may not be corrected upon review.   Curt Bears 04/30/2014, 3:22 PM

## 2014-04-30 NOTE — Telephone Encounter (Signed)
Encounter Closed---04/30/14 TP 

## 2014-04-30 NOTE — Telephone Encounter (Signed)
Gave pt appt for lab and MD for May 2015

## 2014-04-30 NOTE — Telephone Encounter (Signed)
Gave pt appt for lab and MD, for May sent pt to labs today

## 2014-05-01 LAB — IRON AND TIBC CHCC
%SAT: 18 % — ABNORMAL LOW (ref 20–55)
IRON: 49 ug/dL (ref 42–163)
TIBC: 274 ug/dL (ref 202–409)
UIBC: 225 ug/dL (ref 117–376)

## 2014-05-01 LAB — FERRITIN CHCC: Ferritin: 206 ng/ml (ref 22–316)

## 2014-05-03 ENCOUNTER — Other Ambulatory Visit: Payer: Self-pay | Admitting: Family Medicine

## 2014-05-03 LAB — HYPERCOAGULABLE PANEL, COMPREHENSIVE
ANTICARDIOLIPIN IGA: 18 U/mL (ref <22)
ANTITHROMB III FUNC: 114 % (ref 76–126)
Anticardiolipin IgG: 3 GPL U/mL (ref <23)
Anticardiolipin IgM: 1 MPL U/mL (ref <11)
Beta-2 Glyco I IgG: 7 G Units (ref <20)
Beta-2-Glycoprotein I IgA: 17 A Units (ref <20)
Beta-2-Glycoprotein I IgM: 6 M Units (ref <20)
DRVVT 1 1 MIX: 51.4 s — AB (ref <42.9)
DRVVT CONFIRMATION: 0.96 ratio (ref <1.11)
DRVVT: 74.3 secs — ABNORMAL HIGH (ref <42.9)
Lupus Anticoagulant: NOT DETECTED
PROTEIN C ACTIVITY: 190 % — AB (ref 75–133)
PTT Lupus Anticoagulant: 42.9 secs (ref 28.0–43.0)
Protein C, Total: 137 % (ref 72–160)
Protein S Activity: 175 % — ABNORMAL HIGH (ref 69–129)
Protein S Total: 106 % (ref 60–150)

## 2014-05-03 NOTE — Telephone Encounter (Signed)
Electronic refill request. Last Filled:   03/29/14 but not on patient's current meds list.  Please advise.

## 2014-05-04 NOTE — Telephone Encounter (Signed)
Sent!

## 2014-05-08 ENCOUNTER — Other Ambulatory Visit: Payer: Self-pay | Admitting: Family Medicine

## 2014-05-09 NOTE — Telephone Encounter (Signed)
Rx called to pharmacy as instructed. 

## 2014-05-09 NOTE — Telephone Encounter (Signed)
Please call in.  Thanks.   

## 2014-05-11 ENCOUNTER — Other Ambulatory Visit: Payer: Self-pay | Admitting: Family Medicine

## 2014-05-14 ENCOUNTER — Other Ambulatory Visit: Payer: Self-pay | Admitting: Family Medicine

## 2014-05-22 ENCOUNTER — Ambulatory Visit (INDEPENDENT_AMBULATORY_CARE_PROVIDER_SITE_OTHER): Payer: Commercial Managed Care - HMO | Admitting: Cardiovascular Disease

## 2014-05-22 ENCOUNTER — Encounter: Payer: Self-pay | Admitting: Cardiovascular Disease

## 2014-05-22 VITALS — BP 111/65 | HR 61 | Ht 66.0 in | Wt 207.2 lb

## 2014-05-22 DIAGNOSIS — I2510 Atherosclerotic heart disease of native coronary artery without angina pectoris: Secondary | ICD-10-CM

## 2014-05-22 DIAGNOSIS — I82409 Acute embolism and thrombosis of unspecified deep veins of unspecified lower extremity: Secondary | ICD-10-CM

## 2014-05-22 DIAGNOSIS — E7800 Pure hypercholesterolemia, unspecified: Secondary | ICD-10-CM

## 2014-05-22 DIAGNOSIS — I200 Unstable angina: Secondary | ICD-10-CM

## 2014-05-22 DIAGNOSIS — I10 Essential (primary) hypertension: Secondary | ICD-10-CM

## 2014-05-22 NOTE — Assessment & Plan Note (Signed)
The patient was diagnosed with extensive left lower extremity DVT involving the left common femoral, DP femoral, upper front profundus and popliteal veins. Was placed on Xarelto oral anticoagulation. He still somewhat symptomatic with asymmetry in the appearance of his of his lower extremities. We will recheck venous Doppler studies in 2 months and I will see him back in 3 months. I suspect with the extent of his thrombus burden that he will have an element of chronic venous hypertension and post-thrombotic syndrome.

## 2014-05-22 NOTE — Patient Instructions (Signed)
Your physician has requested that you have a lower extremity venous duplex. This test is an ultrasound of the veins in the legs. It looks at venous blood flow that carries blood from the heart to the legs. Allow one hour for a Lower Venous exam. There are no restrictions or special instructions.  Your physician recommends that you schedule a follow-up appointment in 3 months with Dr Gwenlyn Found.

## 2014-05-22 NOTE — Assessment & Plan Note (Signed)
On statin therapy followed by his PCP 

## 2014-05-22 NOTE — Assessment & Plan Note (Signed)
Controlled on current medications 

## 2014-05-22 NOTE — Assessment & Plan Note (Signed)
The lung having chest pain after transfusion to normal hemoglobin

## 2014-05-22 NOTE — Progress Notes (Signed)
05/22/2014 [place]   03-28-1946  [id number]  Primary Physician [name], MD Primary Cardiologist: [name] MD Renae Gloss   HPI:  The patient is a very pleasant 68 year old mildly to moderately overweight married Caucasian male father of 30, grandfather to 3 grandchildren who I saw a year ago. He has a history of ischemic heart disease status post cardiac catheterization by myself November 15, 1998 revealing 3-vessel disease with preserved LV function. Medical therapy was recommended at that time. He had a 30% distal left main, a 75% mid LAD, a 50% OM and a total RCA with left-to-right collaterals and normal LV function. His other problems include hypertension and hyperlipidemia. He is totally asymptomatic. His last Myoview performed August 06, 2010 was nonischemic. Lipid profile  performed 03/08/14 revealed a total cholesterol of 98, LDL 31 and HDL 41. The last saw him in October he developed progressive dyspnea on exertion and substernal chest pain. I plan to performing outpatient cardiac catheterization on him however his preprocedure labs revealed profound anemia requiring workup including endoscopy. He was placed on oral therapy for H. Pylori and transfused. His cardiac symptoms have resolved. He did get up and found left lower shimmy DVT and is currently on oral anticoagulation.    Current Outpatient Prescriptions  Medication Sig Dispense Refill  . cyanocobalamin (,VITAMIN B-12,) 1000 MCG/ML injection Inject 1,000 mcg into the muscle every 30 (thirty) days.      . cyclobenzaprine (FLEXERIL) 5 MG tablet TAKE 1 TABLET BY MOUTH 3 TIMES A DAY AS NEEDED FOR MUSCLE SPASMS  30 tablet  0  . glipiZIDE (GLUCOTROL XL) 10 MG 24 hr tablet TAKE 1 TABLET BY MOUTH ONCE A DAY  30 tablet  5  . isosorbide mononitrate (IMDUR) 60 MG 24 hr tablet Take 1 tablet (60 mg total) by mouth daily.  30 tablet  5  . lisinopril-hydrochlorothiazide (PRINZIDE,ZESTORETIC) 20-12.5 MG per  tablet Take 1 tablet by mouth daily.      . metFORMIN (GLUCOPHAGE) 1000 MG tablet Take 1 tablet (1,000 mg total) by mouth 2 (two) times daily.      . metoprolol succinate (TOPROL-XL) 25 MG 24 hr tablet Take 25 mg by mouth daily.      . nitroGLYCERIN (NITROSTAT) 0.4 MG SL tablet Place 0.4 mg under the tongue every 5 (five) minutes as needed for chest pain.      . NON FORMULARY Take 1 tablet by mouth daily. Nattokinase Complex one tab daily      . NON FORMULARY Take 12 mg by mouth daily. Astaxanthin      . NON FORMULARY Take 1 tablet by mouth daily. Diabetes Multi Vitamin and Minerals one tablet daily      . pantoprazole (PROTONIX) 40 MG tablet Take 1 tablet (40 mg total) by mouth daily.  30 tablet  5  . Probiotic Product (VSL#3) CAPS Take 1 capsule by mouth daily.  30 capsule  6  . Rivaroxaban (XARELTO) 20 MG TABS tablet Take 1 tablet (20 mg total) by mouth daily with supper.  90 tablet  1  . traMADol (ULTRAM) 50 MG tablet Take 100 mg by mouth 3 (three) times daily.       [name] zolpidem (AMBIEN) 10 MG tablet TAKE 1 TABLET BY MOUTH EVERY NIGHT AT BEDTIME AS NEEDED  30 tablet  5   No current facility-administered medications for this visit.    No Known Allergies  History   Social History  . Marital Status: Married  Spouse Name: N/A    Number of Children: N/A  . Years of Education: N/A   Occupational History  . Not on file.   Social History Main Topics  . Smoking status: Former Smoker -- 2.00 packs/day for 10 years    Types: Cigarettes    Quit date: 12/28/1974  . Smokeless tobacco: Never Used  . Alcohol Use: Yes     Comment: 03/15/2014 "6 pack beer or less/yr"  . Drug Use: No  . Sexual Activity: Not Currently   Other Topics Concern  . Not on file   Social History Narrative   Married 1967   Retired from [employer] work   Lives in [place] with his wife.     Review of Systems: General: negative for chills, fever, night sweats or weight changes.  Cardiovascular: negative for  chest pain, dyspnea on exertion, edema, orthopnea, palpitations, paroxysmal nocturnal dyspnea or shortness of breath Dermatological: negative for rash Respiratory: negative for cough or wheezing Urologic: negative for hematuria Abdominal: negative for nausea, vomiting, diarrhea, bright red blood per rectum, melena, or hematemesis Neurologic: negative for visual changes, syncope, or dizziness All other systems reviewed and are otherwise negative except as noted above.    Blood pressure 111/65, pulse 61, height 5' 6"  (1.676 m), weight 207 lb 3.2 oz (93.985 kg).  General appearance: alert and no distress Neck: no adenopathy, no carotid bruit, no JVD, supple, symmetrical, trachea midline and thyroid not enlarged, symmetric, no tenderness/mass/nodules Lungs: clear to auscultation bilaterally Heart: regular rate and rhythm, S1, S2 normal, no murmur, click, rub or gallop Extremities: 2+ pedal pulses bilaterally, asymmetry in his lower shimmy his with his left calf and ankle larger than right.  EKG not performed today  ASSESSMENT AND PLAN:   CAD (coronary artery disease) History of CAD status post cardiac catheterization in 11/15/98 revealing three-vessel disease with preserved LV function. Medical therapy was recommended at that time. He had 30% distal left main, 35% mid LAD, 50% obtuse marginal branch stenosis and a total RCA with left to right collaterals and normally functioning. Because of progressive chest pain he was set up for cardiac catheterization back in March however his preoperative labs were revealed him to be profoundly anemic probably contributing to his symptoms. After transfusion his symptoms have resolved. We'll continue current medical therapy.  Essential hypertension, benign Controlled on current medications  Pure hypercholesterolemia On statin therapy followed by his PCP  Unstable angina The lung having chest pain after transfusion to normal hemoglobin  DVT (deep  venous thrombosis) The patient was diagnosed with extensive left lower extremity DVT involving the left common femoral, DP femoral, upper front profundus and popliteal veins. Was placed on Xarelto oral anticoagulation. He still somewhat symptomatic with asymmetry in the appearance of his of his lower extremities. We will recheck venous Doppler studies in 2 months and I will see him back in 3 months. I suspect with the extent of his thrombus burden that he will have an element of chronic venous hypertension and post-thrombotic syndrome.       [name] MD FACP,FACC,FAHA, [facility] 05/22/2014 3:25 PM

## 2014-05-22 NOTE — Assessment & Plan Note (Signed)
History of CAD status post cardiac catheterization in 11/15/98 revealing three-vessel disease with preserved LV function. Medical therapy was recommended at that time. He had 30% distal left main, 35% mid LAD, 50% obtuse marginal branch stenosis and a total RCA with left to right collaterals and normally functioning. Because of progressive chest pain he was set up for cardiac catheterization back in March however his preoperative labs were revealed him to be profoundly anemic probably contributing to his symptoms. After transfusion his symptoms have resolved. We'll continue current medical therapy.

## 2014-05-23 ENCOUNTER — Other Ambulatory Visit: Payer: Self-pay | Admitting: Medical Oncology

## 2014-05-23 DIAGNOSIS — D509 Iron deficiency anemia, unspecified: Secondary | ICD-10-CM

## 2014-05-24 ENCOUNTER — Encounter: Payer: Self-pay | Admitting: Internal Medicine

## 2014-05-24 ENCOUNTER — Other Ambulatory Visit (HOSPITAL_BASED_OUTPATIENT_CLINIC_OR_DEPARTMENT_OTHER): Payer: Commercial Managed Care - HMO

## 2014-05-24 ENCOUNTER — Ambulatory Visit (HOSPITAL_BASED_OUTPATIENT_CLINIC_OR_DEPARTMENT_OTHER): Payer: Commercial Managed Care - HMO | Admitting: Internal Medicine

## 2014-05-24 VITALS — BP 160/94 | HR 81 | Temp 98.5°F | Resp 19 | Ht 66.0 in | Wt 208.8 lb

## 2014-05-24 DIAGNOSIS — D509 Iron deficiency anemia, unspecified: Secondary | ICD-10-CM

## 2014-05-24 DIAGNOSIS — I82409 Acute embolism and thrombosis of unspecified deep veins of unspecified lower extremity: Secondary | ICD-10-CM

## 2014-05-24 LAB — CBC WITH DIFFERENTIAL/PLATELET
BASO%: 0.4 % (ref 0.0–2.0)
BASOS ABS: 0 10*3/uL (ref 0.0–0.1)
EOS ABS: 0.2 10*3/uL (ref 0.0–0.5)
EOS%: 3.3 % (ref 0.0–7.0)
HCT: 37.3 % — ABNORMAL LOW (ref 38.4–49.9)
HEMOGLOBIN: 11.6 g/dL — AB (ref 13.0–17.1)
LYMPH#: 1.5 10*3/uL (ref 0.9–3.3)
LYMPH%: 33.9 % (ref 14.0–49.0)
MCH: 25.2 pg — ABNORMAL LOW (ref 27.2–33.4)
MCHC: 31.1 g/dL — ABNORMAL LOW (ref 32.0–36.0)
MCV: 81.1 fL (ref 79.3–98.0)
MONO#: 0.5 10*3/uL (ref 0.1–0.9)
MONO%: 10.2 % (ref 0.0–14.0)
NEUT%: 52.2 % (ref 39.0–75.0)
NEUTROS ABS: 2.4 10*3/uL (ref 1.5–6.5)
Platelets: 152 10*3/uL (ref 140–400)
RBC: 4.6 10*6/uL (ref 4.20–5.82)
RDW: 26.3 % — AB (ref 11.0–14.6)
WBC: 4.5 10*3/uL (ref 4.0–10.3)
nRBC: 0 % (ref 0–0)

## 2014-05-24 LAB — COMPREHENSIVE METABOLIC PANEL (CC13)
ALK PHOS: 53 U/L (ref 40–150)
ALT: 11 U/L (ref 0–55)
ANION GAP: 11 meq/L (ref 3–11)
AST: 19 U/L (ref 5–34)
Albumin: 3.7 g/dL (ref 3.5–5.0)
BUN: 14.5 mg/dL (ref 7.0–26.0)
CO2: 26 meq/L (ref 22–29)
Calcium: 9.7 mg/dL (ref 8.4–10.4)
Chloride: 104 mEq/L (ref 98–109)
Creatinine: 1.1 mg/dL (ref 0.7–1.3)
GLUCOSE: 172 mg/dL — AB (ref 70–140)
POTASSIUM: 3.9 meq/L (ref 3.5–5.1)
SODIUM: 142 meq/L (ref 136–145)
TOTAL PROTEIN: 7.3 g/dL (ref 6.4–8.3)
Total Bilirubin: 0.4 mg/dL (ref 0.20–1.20)

## 2014-05-24 NOTE — Progress Notes (Signed)
[place] [phone]   [phone] OFFICE PROGRESS NOTE  Manuel Stain, MD [place] [address]  DIAGNOSIS:  1) Extensive deep venous thrombosis of the left lower extremity of unclear predisposing factors. 2) iron deficiency anemia.  PRIOR THERAPY: None  CURRENT THERAPY: Xarelto 20 mg by mouth daily.  INTERVAL HISTORY: Manuel Grant 68 y.o. male returns to the clinic today for followup visit accompanied his wife. The patient is feeling fine today with no specific complaints except for persistent mild swelling of the left lower extremity after his recent diagnosis of deep venous thrombosis. He is currently on Xarelto 20 mg by mouth daily and tolerating it fairly well. He denied having any significant chest pain, shortness breath, cough or hemoptysis. He denied having any nausea or vomiting. He has no bleeding issues. He has a hypercoagulable panel performed recently that was unremarkable for any genetic or acquired abnormalities. He also has iron studies performed on 04/30/2014 that showed evidence for iron deficiency anemia.   MEDICAL HISTORY: Past Medical History  Diagnosis Date  . Ulcerative colitis     s/p colectomy ~ 2005 at [facility]  . Renal stones     uric acid stones  . Chronic kidney disease   . Hypertension   . Hyperlipidemia   . Rosacea   . Insomnia   . Hiatal hernia   . Arthritis     Knee OA- per [facility]- injected prev by ortho  . Coronary artery disease     a. 10/1998 Cath: LM 30d, LAD 38m OM 50, RCA 100 w/ L->R collats, nl LV ->Med Rx;  b. 07/2010 MV: no ischemia.  .[name] KitchenB12 deficiency   . Osteoarthritis   . Type II diabetes mellitus   . Myocardial infarction 1999    "heart occasion" (03/15/2014)  . Pneumonia 1970's  . Anemia   . Deep venous thrombosis     left lower extremity    ALLERGIES:  has No Known Allergies.  MEDICATIONS:  Current Outpatient Prescriptions  Medication Sig Dispense  Refill  . glipiZIDE (GLUCOTROL XL) 10 MG 24 hr tablet TAKE 1 TABLET BY MOUTH ONCE A DAY  30 tablet  5  . isosorbide mononitrate (IMDUR) 60 MG 24 hr tablet Take 1 tablet (60 mg total) by mouth daily.  30 tablet  5  . lisinopril-hydrochlorothiazide (PRINZIDE,ZESTORETIC) 20-12.5 MG per tablet Take 1 tablet by mouth daily.      .[name] Kitchenlovastatin (MEVACOR) 10 MG tablet Take 10 mg by mouth at bedtime.      . metFORMIN (GLUCOPHAGE) 1000 MG tablet Take 1 tablet (1,000 mg total) by mouth 2 (two) times daily.      . metoprolol succinate (TOPROL-XL) 25 MG 24 hr tablet Take 25 mg by mouth daily.      . pantoprazole (PROTONIX) 40 MG tablet Take 1 tablet (40 mg total) by mouth daily.  30 tablet  5  . Probiotic Product (VSL#3) CAPS Take 1 capsule by mouth daily.  30 capsule  6  . Rivaroxaban (XARELTO) 20 MG TABS tablet Take 1 tablet (20 mg total) by mouth daily with supper.  90 tablet  1  . traMADol (ULTRAM) 50 MG tablet Take 100 mg by mouth 3 (three) times daily.       .[name] Kitchenzolpidem (AMBIEN) 10 MG tablet TAKE 1 TABLET BY MOUTH EVERY NIGHT AT BEDTIME AS NEEDED  30 tablet  5  . cyanocobalamin (,VITAMIN B-12,) 1000 MCG/ML injection  Inject 1,000 mcg into the muscle every 30 (thirty) days.       No current facility-administered medications for this visit.    SURGICAL HISTORY:  Past Surgical History  Procedure Laterality Date  . Subtotal colectomy  2005    with J pouch creation at [facility]  . Knee arthroscopy Bilateral 1990's  . Hernia repair Right ~ 2006    "abdomen; was as a result of a surgery"  . Cardiac catheterization  11/15/1998  . Esophagogastroduodenoscopy N/A 03/16/2014    Procedure: ESOPHAGOGASTRODUODENOSCOPY (EGD);  Surgeon: Jerene Bears, MD;  Location: [facility] ENDOSCOPY;  Service: Endoscopy;  Laterality: N/A;  . Flexible sigmoidoscopy N/A 03/16/2014    Procedure: FLEXIBLE SIGMOIDOSCOPY;  Surgeon: Jerene Bears, MD;  Location: [facility] ENDOSCOPY;  Service: Endoscopy;  Laterality: N/A;    REVIEW OF SYSTEMS:  A  comprehensive review of systems was negative except for: Swelling of the left lower extremity   PHYSICAL EXAMINATION: General appearance: alert, cooperative and no distress Head: Normocephalic, without obvious abnormality, atraumatic Neck: no adenopathy, no JVD, supple, symmetrical, trachea midline and thyroid not enlarged, symmetric, no tenderness/mass/nodules Lymph nodes: Cervical, supraclavicular, and axillary nodes normal. Resp: clear to auscultation bilaterally Back: symmetric, no curvature. ROM normal. No CVA tenderness. Cardio: regular rate and rhythm, S1, S2 normal, no murmur, click, rub or gallop GI: soft, non-tender; bowel sounds normal; no masses,  no organomegaly Extremities: extremities normal, atraumatic, no cyanosis or edema  ECOG PERFORMANCE STATUS: 1 - Symptomatic but completely ambulatory  Blood pressure 160/94, pulse 81, temperature 98.5 F (36.9 C), temperature source Oral, resp. rate 19, height 5' 6"  (1.676 m), weight 208 lb 12.8 oz (94.711 kg), SpO2 100.00%.  LABORATORY DATA: Lab Results  Component Value Date   WBC 4.5 05/24/2014   HGB 11.6* 05/24/2014   HCT 37.3* 05/24/2014   MCV 81.1 05/24/2014   PLT 152 05/24/2014      Chemistry      Component Value Date/Time   NA 138 04/18/2014 1653   K 4.2 04/18/2014 1653   CL 102 04/18/2014 1653   CO2 27 04/18/2014 1653   BUN 37* 04/18/2014 1653   CREATININE 1.4 04/18/2014 1653   CREATININE 1.01 03/12/2014 1246      Component Value Date/Time   CALCIUM 9.7 04/18/2014 1653   ALKPHOS 48 04/18/2014 1653   AST 23 04/18/2014 1653   ALT 13 04/18/2014 1653   BILITOT 0.3 04/18/2014 1653       RADIOGRAPHIC STUDIES: No results found.  ASSESSMENT AND PLAN: This is a very pleasant 68 years old white male recently diagnosed with extensive deep venous thrombosis of the left lower extremity. His hypercoagulable panel showed no acquired or genetic abnormalities. I recommended for the patient to continue Xarelto for a total of 6  months. For the iron deficiency anemia, I recommended for him to start taking over-the-counter oral iron tablets 1-2 tablets every day. He'll follow up with his primary care physician as previously scheduled. I will discharge the patient from the clinic today but will be happy to see him in the future if needed The patient voices understanding of current disease status and treatment options and is in agreement with the current care plan.  All questions were answered. The patient knows to call the clinic with any problems, questions or concerns. We can certainly see the patient much sooner if necessary.  Disclaimer: This note was dictated with voice recognition software. Similar sounding words can inadvertently be transcribed and may not be corrected upon  review.

## 2014-05-25 ENCOUNTER — Encounter: Payer: Self-pay | Admitting: Internal Medicine

## 2014-05-29 ENCOUNTER — Ambulatory Visit (INDEPENDENT_AMBULATORY_CARE_PROVIDER_SITE_OTHER): Payer: Commercial Managed Care - HMO | Admitting: Internal Medicine

## 2014-05-29 ENCOUNTER — Encounter: Payer: Self-pay | Admitting: Internal Medicine

## 2014-05-29 VITALS — BP 130/70 | HR 116 | Ht 66.0 in | Wt 209.1 lb

## 2014-05-29 DIAGNOSIS — K91858 Other complications of intestinal pouch: Secondary | ICD-10-CM

## 2014-05-29 DIAGNOSIS — K269 Duodenal ulcer, unspecified as acute or chronic, without hemorrhage or perforation: Secondary | ICD-10-CM

## 2014-05-29 DIAGNOSIS — D509 Iron deficiency anemia, unspecified: Secondary | ICD-10-CM

## 2014-05-29 DIAGNOSIS — Z7901 Long term (current) use of anticoagulants: Secondary | ICD-10-CM

## 2014-05-29 DIAGNOSIS — Z9889 Other specified postprocedural states: Secondary | ICD-10-CM

## 2014-05-29 DIAGNOSIS — Z8719 Personal history of other diseases of the digestive system: Secondary | ICD-10-CM

## 2014-05-29 DIAGNOSIS — Z9049 Acquired absence of other specified parts of digestive tract: Secondary | ICD-10-CM

## 2014-05-29 MED ORDER — VSL#3 PO PACK: 1.0000 | PACK | Freq: Every day | ORAL | Status: DC

## 2014-05-29 MED ORDER — PANTOPRAZOLE SODIUM 40 MG PO TBEC: 40.0000 mg | DELAYED_RELEASE_TABLET | Freq: Every day | ORAL | Status: DC

## 2014-05-29 NOTE — Progress Notes (Signed)
Patient ID: Manuel Grant, male   DOB: 1946/07/07, 68 y.o.   MRN: [id number] HPI: [name] is a 68 yo male with PMH of ulcerative colitis status post remote subtotal colectomy with J-pouch performed at [facility] by Dr. Grace Bushy, diabetes, B12 deficiency, hypertension, hyperlipidemia, kidney stones, who is seen by me in consultation during a recent hospitalization for profound iron deficiency anemia who presents for followup. He is here with his wife.Manuel Grant He was having exertional angina and dyspnea in the setting of his anemia. During hospitalization he had an upper endoscopy and flexible sigmoidoscopy. These studies were performed on 03/16/2014. EGD revealed mild reflux esophagitis, mild erosive gastritis in the distal stomach and 2 small nonbleeding ulcers in the duodenal bulb. Biopsies were performed. Flexible sigmoidoscopy revealed anastomosis with anastomotic ulcer and visible staples. The small ulcer in the distal ileum and mild anal stenosis with anal canal polyp. Biopsies were done. Pathology showed normal duodenum, chronic active gastritis with focal metaplasia without H. pylori dysplasia or malignancy. Small intestine J-pouch biopsies showed benign small bowel type mucosa and chronic mucosa with focal inflammation. Consistent with clinically stated anastomosis. No dysplasia. Anus biopsy polypoid granulation tissue no evidence of malignancy. During his hospital stay he received IV iron and 2 units of packed red blood cells. He was discharged with the VSL #3 and the advice to avoid NSAIDs. He was also treated with pantoprazole 40 mg daily. Once biopsies returned the decision was made to treat for H. pylori given gastric metaplasia and ulcer disease though no H. pylori was seen by biopsy. He completed treatment with Pylera.  Today he reports he is feeling much better. His energy levels have improved. He is having no further chest pain or dyspnea. He continues to take Xarelto daily for his history of left  lower extremity DVT. He is having 8-10 bowel movements per day for him which is normal ever since his colectomy. He reports nonbloody non-melenic stools. Stools range from nearly formed to watery depending on intake. He denies abdominal pain. He denies heartburn, dysphagia or odynophagia.  He was seen by hematology, Dr. Julien Nordmann. He received IV iron during hospitalization he continues on oral iron daily.  He has subsequently stopped probiotic after his supply ran out  Past Medical History  Diagnosis Date  . Ulcerative colitis     s/p colectomy ~ 2005 at [facility]  . Renal stones     uric acid stones  . Chronic kidney disease   . Hypertension   . Hyperlipidemia   . Rosacea   . Insomnia   . Hiatal hernia   . Arthritis     Knee OA- per [facility]- injected prev by ortho  . Coronary artery disease     a. 10/1998 Cath: LM 30d, LAD 77m OM 50, RCA 100 w/ L->R collats, nl LV ->Med Rx;  b. 07/2010 MV: no ischemia.  .Manuel KitchenB12 deficiency   . Osteoarthritis   . Type II diabetes mellitus   . Myocardial infarction 1999    "heart occasion" (03/15/2014)  . Pneumonia 1970's  . Anemia   . Deep venous thrombosis     left lower extremity  . GI bleed   . Colon polyps     Past Surgical History  Procedure Laterality Date  . Subtotal [facility]lectomy  2005    [facility] . Knee arthroscopy Bilateral 1990's  . Hernia repair Right ~ 2006    "abdomen; was as a result of a surgery"  .  Cardiac catheterization  11/15/1998  . Esophagogastroduodenoscopy N/A 03/16/2014    Procedure: ESOPHAGOGASTRODUODENOSCOPY (EGD);  Surgeon: Jerene Bears, MD;  Location: [facility] ENDOSCOPY;  Service: Endoscopy;  Laterality: N/A;  . Flexible sigmoidoscopy N/A 03/16/2014    Procedure: FLEXIBLE SIGMOIDOSCOPY;  Surgeon: Jerene Bears, MD;  Location: [facility] ENDOSCOPY;  Service: Endoscopy;  Laterality: N/A;    Current Outpatient Prescriptions  Medication Sig Dispense Refill  . cyanocobalamin (,VITAMIN B-12,) 1000 MCG/ML  injection Inject 1,000 mcg into the muscle every 30 (thirty) days.      Manuel Grant glipiZIDE (GLUCOTROL XL) 10 MG 24 hr tablet TAKE 1 TABLET BY MOUTH ONCE A DAY  30 tablet  5  . isosorbide mononitrate (IMDUR) 60 MG 24 hr tablet Take 1 tablet (60 mg total) by mouth daily.  30 tablet  5  . lisinopril-hydrochlorothiazide (PRINZIDE,ZESTORETIC) 20-12.5 MG per tablet Take 1 tablet by mouth daily.      Manuel Grant lovastatin (MEVACOR) 10 MG tablet Take 10 mg by mouth at bedtime.      . metFORMIN (GLUCOPHAGE) 1000 MG tablet Take 1 tablet (1,000 mg total) by mouth 2 (two) times daily.      . metoprolol succinate (TOPROL-XL) 25 MG 24 hr tablet Take 25 mg by mouth daily.      . pantoprazole (PROTONIX) 40 MG tablet Take 1 tablet (40 mg total) by mouth daily.  30 tablet  5  . Rivaroxaban (XARELTO) 20 MG TABS tablet Take 1 tablet (20 mg total) by mouth daily with supper.  90 tablet  1  . traMADol (ULTRAM) 50 MG tablet Take 100 mg by mouth 3 (three) times daily.       Manuel Grant zolpidem (AMBIEN) 10 MG tablet TAKE 1 TABLET BY MOUTH EVERY NIGHT AT BEDTIME AS NEEDED  30 tablet  5  . Dietary Management Product (VSL#3) PACK Take 1 each by mouth daily.  30 each  6   No current facility-administered medications for this visit.    No Known Allergies  Family History  Problem Relation Age of Onset  . Heart disease Mother   . Diabetes Mother   . COPD Father   . Colon cancer Neg Hx   . Prostate cancer Neg Hx   . Colon polyps Brother   . Emphysema Father     History  Substance Use Topics  . Smoking status: Former Smoker -- 2.00 packs/day for 10 years    Types: Cigarettes    Quit date: 12/28/1974  . Smokeless tobacco: Never Used  . Alcohol Use: Yes     Comment: 03/15/2014 "6 pack beer or less/yr"    ROS: As per history of present illness, otherwise negative  BP 130/70  Pulse 116  Ht 5' 6"  (1.676 m)  Wt 209 lb 2 oz (94.858 kg)  BMI 33.77 kg/m2 Constitutional: Well-developed and well-nourished. No distress. HEENT:  Normocephalic and atraumatic. Oropharynx is clear and moist. No oropharyngeal exudate. Conjunctivae are normal.  No scleral icterus. Neck: Neck supple. Trachea midline. Cardiovascular: Normal rate, regular rhythm and intact distal pulses. No M/R/G Pulmonary/chest: Effort normal and breath sounds normal. No wheezing, rales or rhonchi. Abdominal: Soft, hernia at the site of right lower quadrant ostomy which has been taken down, nontender, nondistended. Bowel sounds active throughout.  Extremities: no clubbing, cyanosis, mild edema the left lower extremity Lymphadenopathy: No cervical adenopathy noted. Neurological: Alert and oriented to person place and time. Skin: Skin is warm and dry. Scattered petechiae right greater than left lower extremity Psychiatric: Normal mood and affect.  Behavior is normal.  RELEVANT LABS AND IMAGING: CBC    Component Value Date/Time   WBC 4.5 05/24/2014 0817   WBC 6.7 04/18/2014 1653   RBC 4.60 05/24/2014 0817   RBC 5.01 04/18/2014 1653   HGB 11.6* 05/24/2014 0817   HGB 11.0* 04/18/2014 1653   HCT 37.3* 05/24/2014 0817   HCT 34.8* 04/18/2014 1653   PLT 152 05/24/2014 0817   PLT 309.0 04/18/2014 1653   MCV 81.1 05/24/2014 0817   MCV 69.4 Repeated and verified X2.* 04/18/2014 1653   MCH 25.2* 05/24/2014 0817   MCH 21.1* 04/06/2014 0605   MCHC 31.1* 05/24/2014 0817   MCHC 31.6 04/18/2014 1653   RDW 26.3* 05/24/2014 0817   RDW 34.3* 04/18/2014 1653   LYMPHSABS 1.5 05/24/2014 0817   LYMPHSABS 2.1 04/18/2014 1653   MONOABS 0.5 05/24/2014 0817   MONOABS 0.6 04/18/2014 1653   EOSABS 0.2 05/24/2014 0817   EOSABS 0.1 04/18/2014 1653   BASOSABS 0.0 05/24/2014 0817   BASOSABS 0.0 04/18/2014 1653    CMP     Component Value Date/Time   NA 142 05/24/2014 0817   NA 138 04/18/2014 1653   K 3.9 05/24/2014 0817   K 4.2 04/18/2014 1653   CL 102 04/18/2014 1653   CO2 26 05/24/2014 0817   CO2 27 04/18/2014 1653   GLUCOSE 172* 05/24/2014 0817   GLUCOSE 96 04/18/2014 1653   BUN 14.5 05/24/2014  0817   BUN 37* 04/18/2014 1653   CREATININE 1.1 05/24/2014 0817   CREATININE 1.4 04/18/2014 1653   CREATININE 1.01 03/12/2014 1246   CALCIUM 9.7 05/24/2014 0817   CALCIUM 9.7 04/18/2014 1653   PROT 7.3 05/24/2014 0817   PROT 8.2 04/18/2014 1653   ALBUMIN 3.7 05/24/2014 0817   ALBUMIN 3.6 04/18/2014 1653   AST 19 05/24/2014 0817   AST 23 04/18/2014 1653   ALT 11 05/24/2014 0817   ALT 13 04/18/2014 1653   ALKPHOS 53 05/24/2014 0817   ALKPHOS 48 04/18/2014 1653   BILITOT 0.40 05/24/2014 0817   BILITOT 0.3 04/18/2014 1653   GFRNONAA 57* 04/06/2014 0605   GFRAA 65* 04/06/2014 0605   Iron/TIBC/Ferritin    Component Value Date/Time   IRON 49 04/30/2014 1543   IRON 17* 03/15/2014 1600   TIBC 274 04/30/2014 1543   TIBC 405 03/15/2014 1600   FERRITIN 206 04/30/2014 1543   FERRITIN 6* 03/15/2014 1600   CT ABDOMEN AND PELVIS WITHOUT CONTRAST -- April 2015   TECHNIQUE: Multidetector CT imaging of the abdomen and pelvis was performed following the standard protocol without intravenous contrast.   COMPARISON:  None.   FINDINGS: BODY WALL: Supra and infraumbilical midline abdominal wall hernias containing nonobstructed bowel. There is a hernia within the right lower quadrant which traverses the rectus abdominus, and may be a remote stomal hernia. This hernia contains small bowel with enteroenterostomy, nonobstructed.   LOWER CHEST: Multi focal coronary artery atherosclerosis. No cardiomegaly. No lower lung consolidation.   ABDOMEN/PELVIS:   Liver: No focal abnormality.   Biliary: Cholelithiasis.  No gallbladder distention or inflammation.   Pancreas: Unremarkable.   Spleen: Unremarkable.   Adrenals: Unremarkable.   Kidneys and ureters: Symmetric fullness of the renal collecting system, without hydronephrosis. Exophytic water density mass from the interpolar right kidney, 41 mm in diameter, most consistent with cyst.   Bladder: There is minimal layering high density material within the right  bladder lumen.   Reproductive: Unremarkable.   Bowel: Colectomy, likely with ileal pouch anal anastomosis. No  bowel obstruction. No enteritis.   Retroperitoneum: No mass or adenopathy.   Peritoneum: No free fluid or gas.   Vascular: No acute abnormality.   OSSEOUS: No acute abnormality. Left SI joint ankylosis is likely degenerative given bony productive change.   IMPRESSION: 1. Layering calcification in the bladder may reflect recently passed calculi. No hydronephrosis or ureteral calculus. 2. Three abdominal wall hernias containing nonobstructed small bowel, as above. 3. Cholelithiasis without acute cholecystitis.  ASSESSMENT/PLAN: 68 yo male with PMH of ulcerative colitis status post remote subtotal colectomy with J-pouch performed at [facility] by Dr. Grace Bushy, diabetes, B12 deficiency, hypertension, hyperlipidemia, kidney stones, who is seen by me in consultation during a recent hospitalization for profound iron deficiency anemia who presents for followup  1.  IDA -- multifactorial see #2 and 3. Iron studies have improved and I have recommended he continue with oral iron daily. He will then also improved since discharge and hopefully will normalize over time.  2.  Duodenal ulcer disease/gastric metaplasia -- he has been on PPI and also avoiding NSAIDs. He is using tramadol for low back pain and no longer NSAIDs. We did empirically treat H. pylori given the gastric metaplasia. I have recommended repeat upper endoscopy to ensure healing of his small bowel ulcers. We will plan repeat biopsies of the stomach given history of metaplasia. We discussed gastric metaplasia in the overall relatively risk of gastric cancer particularly given his lack of family history of gastric cancer and low incidence of gastric cancer overall in this country.  EGD when repeated will need to be with permission to hold his anticoagulation per his cardiologist  3.  UC status post subtotal colectomy with  anastomotic ulceration -- this is another source for chronic GI blood loss. I would like to repeat flexible sigmoidoscopy to reexamine the anastomosis and check for healing. Again this will need to be on his anticoagulation is held. For now I would like him to take VSL#3 on a regular basis to prevent pouchitis and also given his history of IBD. Avoid NSAIDs.  Will plan for repeat EGD flexible sigmoidoscopy in late August or early September after he sees Dr. Gwenlyn Found with cardiology.  He'll continue oral iron until this time. He is happy with this plan

## 2014-05-29 NOTE — Patient Instructions (Signed)
Continue taking oral iron.  We have sent the following medications to your pharmacy for you to pick up at your convenience: Pantoprazole 40 mg daily, VSL #3 take 1-2 packets daily  Follow up with Dr. Hilarie Fredrickson in mid August

## 2014-06-13 ENCOUNTER — Telehealth: Payer: Self-pay | Admitting: *Deleted

## 2014-06-13 NOTE — Telephone Encounter (Signed)
Left message for patient to call to verify metoprolol.  Refill is for the tartrate but Epic shows ER.  Asked the patient to call with his bottle in front of him so we can verify what needs to be refilled.

## 2014-06-14 NOTE — Telephone Encounter (Signed)
Pt. Has not called back as of this morning with metoprolol dose

## 2014-06-15 ENCOUNTER — Telehealth: Payer: Self-pay | Admitting: *Deleted

## 2014-06-15 MED ORDER — METOPROLOL SUCCINATE ER 50 MG PO TB24: ORAL_TABLET | ORAL | Status: DC

## 2014-06-15 NOTE — Telephone Encounter (Signed)
Patient's insurance will not cover Metoprolol ER 29m 3 tablets daily.  Rx switched to 54mtablets 1 1/2 daily - no prior auth. Required.  Patient notified and voiced understanding.

## 2014-06-15 NOTE — Telephone Encounter (Signed)
Patient's insurance ([employer]) will not cover VSL #3 for patient (taking to prevent pouchitis). Would another OTC probiotic be sufficient for patient?

## 2014-06-17 NOTE — Telephone Encounter (Signed)
This is available OTC and is preferable given his pouch status, would ask if he can afford this OTC with coupons from the office If not, then would try Align 1 capsule daily, but this probiotic (nor any other I am aware of) have IBD data for pouchitis Thanks

## 2014-06-18 NOTE — Telephone Encounter (Signed)
I have advised patient of Dr Vena Rua recommendations and he verbalizes understanding. Coupon mailed to patient for VSL.

## 2014-06-18 NOTE — Telephone Encounter (Signed)
Left message for patient to call back  

## 2014-07-16 ENCOUNTER — Ambulatory Visit (HOSPITAL_COMMUNITY)
Admission: RE | Admit: 2014-07-16 | Discharge: 2014-07-16 | Disposition: A | Discharge status: Home / Self Care | Payer: Medicare HMO | Source: Ambulatory Visit | Attending: Cardiovascular Disease | Admitting: Cardiovascular Disease

## 2014-07-16 DIAGNOSIS — I82409 Acute embolism and thrombosis of unspecified deep veins of unspecified lower extremity: Secondary | ICD-10-CM | POA: Insufficient documentation

## 2014-07-16 DIAGNOSIS — I82402 Acute embolism and thrombosis of unspecified deep veins of left lower extremity: Secondary | ICD-10-CM

## 2014-07-16 NOTE — Progress Notes (Signed)
Left Lower Ext. Venous Duplex Completed. Preliminary results by tech - DVT is still present in the femoral, popliteal and calf veins with some improvement when compared to prior study.  Oda Cogan, BS, RDMS, RVT

## 2014-08-13 ENCOUNTER — Encounter: Payer: Self-pay | Admitting: Cardiovascular Disease

## 2014-08-16 ENCOUNTER — Ambulatory Visit: Payer: Commercial Managed Care - HMO | Admitting: Cardiovascular Disease

## 2014-09-28 ENCOUNTER — Other Ambulatory Visit: Payer: Self-pay | Admitting: Family Medicine

## 2014-10-02 ENCOUNTER — Encounter: Payer: Self-pay | Admitting: *Deleted

## 2014-10-03 ENCOUNTER — Ambulatory Visit (INDEPENDENT_AMBULATORY_CARE_PROVIDER_SITE_OTHER): Payer: Commercial Managed Care - HMO | Admitting: Cardiovascular Disease

## 2014-10-03 ENCOUNTER — Encounter: Payer: Self-pay | Admitting: Cardiovascular Disease

## 2014-10-03 VITALS — BP 110/74 | HR 71 | Ht 68.0 in | Wt 217.7 lb

## 2014-10-03 DIAGNOSIS — I2583 Coronary atherosclerosis due to lipid rich plaque: Secondary | ICD-10-CM

## 2014-10-03 DIAGNOSIS — E7800 Pure hypercholesterolemia, unspecified: Secondary | ICD-10-CM

## 2014-10-03 DIAGNOSIS — I82402 Acute embolism and thrombosis of unspecified deep veins of left lower extremity: Secondary | ICD-10-CM

## 2014-10-03 DIAGNOSIS — I2510 Atherosclerotic heart disease of native coronary artery without angina pectoris: Secondary | ICD-10-CM

## 2014-10-03 DIAGNOSIS — I10 Essential (primary) hypertension: Secondary | ICD-10-CM

## 2014-10-03 NOTE — Assessment & Plan Note (Signed)
History of CAD status post cardiac catheterization performed by myself 11/15/98 revealing three-vessel disease with preserved LV function. Medical therapy was recommended at that time. He had a 30% distal left main, 75% mid LAD, 50% OM and total RCA with left to right collaterals and normal LV function. He had Myoview stress test performed 08/06/10 which was nonischemic. He denies chest pain or shortness of breath. He was having chest pain who is here and I had planned to perform cardiac catheterization however his preprocedure labs reveal him to be profoundly anemic which was probably in retrospect because of his angina. When his anemia was addressed and resolved and his symptoms resolved as well.

## 2014-10-03 NOTE — Assessment & Plan Note (Signed)
Controlled on current medications 

## 2014-10-03 NOTE — Assessment & Plan Note (Signed)
History of left lower extremity DVT involving the deep femoral, popliteal posterior tibial and peroneal veins. He was placed on Xarelto oral  anticoagulation in March. Recent venous Doppler study performed in July revealed that there was partial resolution. The patient's left leg asymmetry has resolved and he is asymptomatic. I'm going to recheck venous Dopplers in 6 months and keep him on oral anticoagulation until that time

## 2014-10-03 NOTE — Assessment & Plan Note (Signed)
On statin therapy with recent lipid profile performed 03/08/14 revealing a total cholesterol 98, LDL 31 and HDL of 41

## 2014-10-03 NOTE — Progress Notes (Signed)
10/03/2014 Manuel Grant   February 16, 1946  [id number]  Primary Physician Manuel Stain, MD Primary Cardiologist: Manuel Harp MD Renae Gloss   HPI:  The patient is a very pleasant 68 year old mildly to moderately overweight married Caucasian male father of 96, grandfather to 3 grandchildren who I saw 5 months  ago. He has a history of ischemic heart disease status post cardiac catheterization by myself November 15, 1998 revealing 3-vessel disease with preserved LV function. Medical therapy was recommended at that time. He had a 30% distal left main, a 75% mid LAD, a 50% OM and a total RCA with left-to-right collaterals and normal LV function. His other problems include hypertension and hyperlipidemia. He is totally asymptomatic. His last Myoview performed August 06, 2010 was nonischemic. Lipid profile performed 03/08/14 revealed a total cholesterol of 98, LDL 31 and HDL 41. The last saw him in October he developed progressive dyspnea on exertion and substernal chest pain. I plan to performing outpatient cardiac catheterization on him however his preprocedure labs revealed profound anemia requiring workup including endoscopy. He was placed on oral therapy for H. Pylori and transfused. His cardiac symptoms have resolved. Back in March he was diagnosed with a left lower extremity deep venous thrombosis involving his common femoral, popliteal and peroneal veins. He was placed on Xarelto oral  anticoagulation at that time which resulted in clinical improvement. Followup his Dopplers in July revealed mild partial resolution.    Current Outpatient Prescriptions  Medication Sig Dispense Refill  . cyanocobalamin (,VITAMIN B-12,) 1000 MCG/ML injection INJECT 1ML INTO THE MUSCLE EVERY 30 DAYS  12 mL  3  . Dietary Management Product (VSL#3) PACK Take 1 each by mouth daily.  30 each  6  . glipiZIDE (GLUCOTROL XL) 10 MG 24 hr tablet TAKE 1 TABLET BY MOUTH ONCE A DAY  30 tablet  5  . isosorbide  mononitrate (IMDUR) 60 MG 24 hr tablet Take 1 tablet (60 mg total) by mouth daily.  30 tablet  5  . lisinopril-hydrochlorothiazide (PRINZIDE,ZESTORETIC) 20-12.5 MG per tablet Take 1 tablet by mouth daily.      [name] lovastatin (MEVACOR) 10 MG tablet Take 10 mg by mouth at bedtime.      . metFORMIN (GLUCOPHAGE) 1000 MG tablet Take 1 tablet (1,000 mg total) by mouth 2 (two) times daily.      . metoprolol succinate (TOPROL-XL) 50 MG 24 hr tablet Take 1 1/2 tablets daily. Take with or immediately following a meal.  45 tablet  5  . pantoprazole (PROTONIX) 40 MG tablet Take 1 tablet (40 mg total) by mouth daily.  30 tablet  5  . Rivaroxaban (XARELTO) 20 MG TABS tablet Take 1 tablet (20 mg total) by mouth daily with supper.  90 tablet  1  . traMADol (ULTRAM) 50 MG tablet Take 100 mg by mouth 3 (three) times daily.       [name] zolpidem (AMBIEN) 10 MG tablet TAKE 1 TABLET BY MOUTH EVERY NIGHT AT BEDTIME AS NEEDED  30 tablet  5  . meloxicam (MOBIC) 7.5 MG tablet Take 1 tablet by mouth daily.      . minocycline (MINOCIN,DYNACIN) 50 MG capsule Take 1 capsule by mouth daily.       No current facility-administered medications for this visit.    No Known Allergies  History   Social History  . Marital Status: Married    Spouse Name: N/A    Number of Children: 2  . Years of Education: N/A  Occupational History  . retired    Social History Main Topics  . Smoking status: Former Smoker -- 2.00 packs/day for 10 years    Types: Cigarettes    Quit date: 12/28/1974  . Smokeless tobacco: Never Used  . Alcohol Use: Yes     Comment: 03/15/2014 "6 pack beer or less/yr"  . Drug Use: No  . Sexual Activity: Not Currently   Other Topics Concern  . Not on file   Social History Narrative   Married 1967   Retired from [employer] work   Lives in [place] with his wife.     Review of Systems: General: negative for chills, fever, night sweats or weight changes.  Cardiovascular: negative for chest pain, dyspnea  on exertion, edema, orthopnea, palpitations, paroxysmal nocturnal dyspnea or shortness of breath Dermatological: negative for rash Respiratory: negative for cough or wheezing Urologic: negative for hematuria Abdominal: negative for nausea, vomiting, diarrhea, bright red blood per rectum, melena, or hematemesis Neurologic: negative for visual changes, syncope, or dizziness All other systems reviewed and are otherwise negative except as noted above.    Blood pressure 110/74, pulse 71, height 5' 8"  (1.727 m), weight 217 lb 11.2 oz (98.748 kg).  General appearance: alert and no distress Neck: no adenopathy, no carotid bruit, no JVD, supple, symmetrical, trachea midline and thyroid not enlarged, symmetric, no tenderness/mass/nodules Lungs: clear to auscultation bilaterally Heart: regular rate and rhythm, S1, S2 normal, no murmur, click, rub or gallop Extremities: extremities normal, atraumatic, no cyanosis or edema  EKG normal sinus rhythm at 71 without ST or T wave changes  ASSESSMENT AND PLAN:   Essential hypertension, benign Controlled on current medications  Pure hypercholesterolemia On statin therapy with recent lipid profile performed 03/08/14 revealing a total cholesterol 98, LDL 31 and HDL of 41  CAD (coronary artery disease) History of CAD status post cardiac catheterization performed by myself 11/15/98 revealing three-vessel disease with preserved LV function. Medical therapy was recommended at that time. He had a 30% distal left main, 75% mid LAD, 50% OM and total RCA with left to right collaterals and normal LV function. He had Myoview stress test performed 08/06/10 which was nonischemic. He denies chest pain or shortness of breath. He was having chest pain who is here and I had planned to perform cardiac catheterization however his preprocedure labs reveal him to be profoundly anemic which was probably in retrospect because of his angina. When his anemia was addressed and resolved  and his symptoms resolved as well.  DVT (deep venous thrombosis) History of left lower extremity DVT involving the deep femoral, popliteal posterior tibial and peroneal veins. He was placed on Xarelto oral  anticoagulation in March. Recent venous Doppler study performed in July revealed that there was partial resolution. The patient's left leg asymmetry has resolved and he is asymptomatic. I'm going to recheck venous Dopplers in 6 months and keep him on oral anticoagulation until that time      Manuel Harp MD [facility], [facility] 10/03/2014 4:44 PM

## 2014-10-03 NOTE — Patient Instructions (Signed)
Your physician wants you to follow-up in: 6 months with Dr Gwenlyn Found after you have had your dopplers of your left leg.  You will receive a reminder letter in the mail two months in advance. If you don't receive a letter, please call our office to schedule the follow-up appointment.

## 2014-10-05 ENCOUNTER — Encounter: Payer: Self-pay | Admitting: Cardiovascular Disease

## 2014-10-08 ENCOUNTER — Other Ambulatory Visit: Payer: Self-pay | Admitting: Family Medicine

## 2014-10-18 ENCOUNTER — Telehealth: Payer: Self-pay | Admitting: Internal Medicine

## 2014-10-18 ENCOUNTER — Telehealth: Payer: Self-pay | Admitting: Cardiovascular Disease

## 2014-10-18 NOTE — Telephone Encounter (Signed)
SPOKE TO Kerin Ransom PA  MAY HOLD FOR ONE DAY, RESTART IF OKAY WITH GI

## 2014-10-18 NOTE — Telephone Encounter (Signed)
Pt states he had BRB from his rectum and would like to be seen sooner than scheduled appt. Pt scheduled to see [name] PA tomorrow at 2pm. Pt states he is on a blood thinner and thought he should stop it. Instructed pt to call Dr. Kennon Holter office and see what they tell him regarding the [facility]. Pt verbalized understanding.

## 2014-10-18 NOTE — Telephone Encounter (Signed)
Pt called in stating that he is having some rectal bleeding which he will be going to the GI doctor on 10/23 to get checked out for. He would like to know if he should continue to take his Xarelto. Please call  Thanks

## 2014-10-18 NOTE — Telephone Encounter (Signed)
Spoke with patient and informed him OK to hold xarelto TONIGHT for procedure tomorrow and resume tomorrow night if OK per GI. Patient voiced understanding.

## 2014-10-19 ENCOUNTER — Encounter: Payer: Self-pay | Admitting: Gastroenterology

## 2014-10-19 ENCOUNTER — Other Ambulatory Visit (INDEPENDENT_AMBULATORY_CARE_PROVIDER_SITE_OTHER): Payer: Commercial Managed Care - HMO

## 2014-10-19 ENCOUNTER — Telehealth: Payer: Self-pay | Admitting: Cardiovascular Disease

## 2014-10-19 ENCOUNTER — Ambulatory Visit (INDEPENDENT_AMBULATORY_CARE_PROVIDER_SITE_OTHER): Payer: Commercial Managed Care - HMO | Admitting: Gastroenterology

## 2014-10-19 VITALS — BP 128/68 | HR 92 | Ht 66.0 in | Wt 219.0 lb

## 2014-10-19 DIAGNOSIS — I82402 Acute embolism and thrombosis of unspecified deep veins of left lower extremity: Secondary | ICD-10-CM

## 2014-10-19 DIAGNOSIS — K625 Hemorrhage of anus and rectum: Secondary | ICD-10-CM

## 2014-10-19 DIAGNOSIS — Z9049 Acquired absence of other specified parts of digestive tract: Secondary | ICD-10-CM

## 2014-10-19 DIAGNOSIS — K51919 Ulcerative colitis, unspecified with unspecified complications: Secondary | ICD-10-CM

## 2014-10-19 DIAGNOSIS — Z9889 Other specified postprocedural states: Secondary | ICD-10-CM

## 2014-10-19 LAB — CBC WITH DIFFERENTIAL/PLATELET
BASOS PCT: 0.5 % (ref 0.0–3.0)
Basophils Absolute: 0 10*3/uL (ref 0.0–0.1)
Eosinophils Absolute: 0.1 10*3/uL (ref 0.0–0.7)
Eosinophils Relative: 1.8 % (ref 0.0–5.0)
HCT: 32 % — ABNORMAL LOW (ref 39.0–52.0)
HEMOGLOBIN: 10 g/dL — AB (ref 13.0–17.0)
LYMPHS PCT: 28.9 % (ref 12.0–46.0)
Lymphs Abs: 1.3 10*3/uL (ref 0.7–4.0)
MCHC: 31.1 g/dL (ref 30.0–36.0)
MCV: 74 fl — ABNORMAL LOW (ref 78.0–100.0)
Monocytes Absolute: 0.5 10*3/uL (ref 0.1–1.0)
Monocytes Relative: 11.6 % (ref 3.0–12.0)
NEUTROS ABS: 2.6 10*3/uL (ref 1.4–7.7)
Neutrophils Relative %: 57.2 % (ref 43.0–77.0)
Platelets: 218 10*3/uL (ref 150.0–400.0)
RBC: 4.33 Mil/uL (ref 4.22–5.81)
RDW: 18.7 % — AB (ref 11.5–15.5)
WBC: 4.5 10*3/uL (ref 4.0–10.5)

## 2014-10-19 NOTE — Telephone Encounter (Signed)
Lurena Joiner spoke with GI

## 2014-10-19 NOTE — Patient Instructions (Signed)
You have been scheduled for a flexible sigmoidoscopy. Please follow the written instructions given to you at your visit today. If you use inhalers (even only as needed), please bring them with you on the day of your procedure.  Your physician has requested the following lab work today: CBC

## 2014-10-19 NOTE — Progress Notes (Addendum)
10/19/2014 Manuel Grant [id number] Sep 24, 1946   History of Present Illness:  This is a 68 yo male with PMH of ulcerative colitis status post remote subtotal colectomy with J-pouch performed at [facility] by Dr. Grace Bushy, diabetes, B12 deficiency, hypertension, hyperlipidemia, kidney stones, and LLE DVT on Xarelto who was seen by Dr. Hilarie Fredrickson in consultation during a hospitalization in March 2015 for profound iron deficiency anemia.   EGD on 03/16/2014 revealed mild reflux esophagitis, mild erosive gastritis in the distal stomach and 2 small nonbleeding ulcers in the duodenal bulb; biopsies were performed. Flexible sigmoidoscopy revealed anastomosis with anastomotic ulcer and visible staples. The small ulcer in the distal ileum and mild anal stenosis with anal canal polyp; biopsies were done. Pathology showed normal duodenum, chronic active gastritis with focal metaplasia without H. pylori dysplasia or malignancy. Small intestine J-pouch biopsies showed benign small bowel type mucosa and chronic mucosa with focal inflammation. Consistent with clinically stated anastomosis. No dysplasia. Anus biopsy polypoid granulation tissue no evidence of malignancy.  During his hospital stay he received IV iron and 2 units of packed red blood cells. He was discharged with the VSL #3 and the advice to avoid NSAIDs. He was also treated with pantoprazole 40 mg daily. Once biopsies returned the decision was made to treat for H. pylori given gastric metaplasia and ulcer disease though no H. pylori was seen by biopsy. He completed treatment with Pylera.  He was also started on oral iron at some point.  At his follow-up visit with Dr. Hilarie Fredrickson on 05/29/2014 it was recommended that he have repeat EGD and flex sig at some point after seeing Dr. Gwenlyn Found to see what was going to happen with his Xarelto treatment.  He has not yet had those procedures.  He was also instructed to continue his oral iron, which he is not taking, and to  continue the VSL #3, which he is not taking due to cost but he is taking Align instead.  He was told to avoid NSAID's but continues to take meloxicam.  He saw Dr. Gwenlyn Found on 10/7 who stated that "recent venous Doppler study performed in July revealed that there was partial resolution (of the DVT). The patient's left leg asymmetry has resolved and he is asymptomatic. I'm going to recheck venous Dopplers in 6 months and keep him on oral anticoagulation until that time."  He is here today complaining of rectal bleeding.  Normally has 8-10 BMs per day since his surgery.  On Wednesday he had 3 or 4 episodes of rectal bleeding described as bright red liquid.  No further evident bleeding since that time.  He called cardiology yesterday and was instructed to hold his Xarelto until see by [place] today.  Today is the only day that he has not taken his Xarelto.  Denies abdominal pain or any other symptoms.    Current Medications, Allergies, Past Medical History, Past Surgical History, Family History and Social History were reviewed in [employer] record.   Physical Exam: BP 128/68  Pulse 92  Ht 5' 6"  (1.676 m)  Wt 219 lb (99.338 kg)  BMI 35.36 kg/m2 General: Well developed white male in no acute distress Head: Normocephalic and atraumatic Eyes:  Sclerae anicteric, conjunctiva pink  Ears: Normal auditory acuity Lungs: Clear throughout to auscultation Heart: Regular rate and rhythm Abdomen: Soft, non-distended.  Normal bowel sounds.  Non-tender.  Multiple scars noted on abdomen and hernia noted at previous ostomy site. Musculoskeletal: Symmetrical with no gross  deformities  Extremities: No edema  Neurological: Alert oriented x 4, grossly non-focal Psychological:  Alert and cooperative. Normal mood and affect  Assessment and Recommendations: *68 yo male with PMH of ulcerative colitis status post remote subtotal colectomy with J-pouch performed at [facility] by Dr. Grace Bushy, diabetes, B12  deficiency, hypertension, hyperlipidemia, kidney stones, left LLE DVT on Xarelto  1.  Rectal bleeding in patient with previous anastomotic ulcer seen on flex sig in 02/2014.  Plan was to repeat flex sig, but was never performed.  He is on Xarelto but was told to hold this by cardiology yesterday when he called and reported the bleeding.  We are going to check a CBC today.  I would like him to have a repeat flex sig and first available is in one week, 10/20.  I spoke with Kerin Ransom, PA-C from cardiology to discuss the Xarelto.  He stated to wait and see what Hgb looks like today.  If Hgb is stable then may want to resume Xarelto and hold 24 hours prior to flex sig.  If Hgb down then should hold until after his flex sig next week.  I offered to try Rowasa enemas in the interim, but patient declined for now.  2.  UC status post subtotal colectomy with anastomotic ulceration -- He is no longer on VSL #3 due to cost, but is taking Align instead.  He was also instructed not to take NSAID's, but is taking meloxicam daily.  I have advised him discontinue that as well.  3.  Duodenal ulcer disease/gastric metaplasia -- he has been on PPI and but has not been avoiding NSAIDs as he was instructed. He was empirically treated for H. pylori given the gastric metaplasia. Dr. Hilarie Fredrickson recommended repeat upper endoscopy to ensure healing of his small bowel ulcers and to plan repeat biopsies of the stomach given history of metaplasia.  Will need EGD repeated eventually as well.  Addendum: Reviewed and agree with initial management. Agree with flexible sigmoidoscopy to reevaluate pouch and anastomotic ulcer. Expect ulcer is source for bleeding. May need surgical revision Flexible sigmoidoscopy scheduled for Thursday this week Patient did not hold Xarelto over the weekend, CBC revealed hemoglobin 10.0 We'll continue Xarelto and hold 24 hours before procedure unless bleeding increases. If so check CBC Wednesday Jerene Bears,  MD

## 2014-10-22 ENCOUNTER — Telehealth: Payer: Self-pay | Admitting: Gastroenterology

## 2014-10-22 ENCOUNTER — Encounter: Payer: Self-pay | Admitting: Gastroenterology

## 2014-10-22 NOTE — Telephone Encounter (Signed)
Pt called for lab results. CBC discussed with pt. Pt states he still had "spurts of blood" from his rectum. Pt states he did take his Xarelto over the weekend, he did not hold it. Please advise what pt should do with Xarelto. Procedure is scheduled for Thursday.

## 2014-10-22 NOTE — Telephone Encounter (Signed)
Given that he is continuing to take Xarelto, he can continue Last dose Tuesday for procedure on Thursday If further bleeding, repeat CBC on Wednesday

## 2014-10-23 ENCOUNTER — Encounter: Payer: Self-pay | Admitting: *Deleted

## 2014-10-23 ENCOUNTER — Ambulatory Visit: Payer: Commercial Managed Care - HMO | Admitting: Nurse Practitioner

## 2014-10-23 ENCOUNTER — Telehealth: Payer: Self-pay | Admitting: *Deleted

## 2014-10-23 NOTE — Telephone Encounter (Signed)
Error on previous progress note.  Patient is having his colonoscopy on 10-26-2014 at 8:30 AM.

## 2014-10-23 NOTE — Telephone Encounter (Signed)
Spoke with pt and he is aware. Will call pt tomorrow to see if he has any more bleeding and if he needs the CBC done.

## 2014-10-23 NOTE — Progress Notes (Signed)
Patient ID: Manuel Grant, male   DOB: 09/24/1946, 68 y.o.   MRN: [id number] Per Manuel Savoy NP-C. This patient doesn't need to be seen today. He is scheduled for his colonoscopy for Friday 10-16-2014.  Can discuss EGD at later time as [name] PA had in her office note from 10-19-2014.

## 2014-10-24 ENCOUNTER — Other Ambulatory Visit (INDEPENDENT_AMBULATORY_CARE_PROVIDER_SITE_OTHER): Payer: Commercial Managed Care - HMO

## 2014-10-24 ENCOUNTER — Other Ambulatory Visit: Payer: Self-pay

## 2014-10-24 ENCOUNTER — Ambulatory Visit (INDEPENDENT_AMBULATORY_CARE_PROVIDER_SITE_OTHER): Payer: Commercial Managed Care - HMO

## 2014-10-24 DIAGNOSIS — K625 Hemorrhage of anus and rectum: Secondary | ICD-10-CM

## 2014-10-24 DIAGNOSIS — Z23 Encounter for immunization: Secondary | ICD-10-CM

## 2014-10-24 LAB — CBC WITH DIFFERENTIAL/PLATELET
BASOS ABS: 0 10*3/uL (ref 0.0–0.1)
BASOS PCT: 0.4 % (ref 0.0–3.0)
Eosinophils Absolute: 0.1 10*3/uL (ref 0.0–0.7)
Eosinophils Relative: 2.4 % (ref 0.0–5.0)
HEMATOCRIT: 32.3 % — AB (ref 39.0–52.0)
Hemoglobin: 10.1 g/dL — ABNORMAL LOW (ref 13.0–17.0)
LYMPHS ABS: 1.6 10*3/uL (ref 0.7–4.0)
Lymphocytes Relative: 26.8 % (ref 12.0–46.0)
MCHC: 31.4 g/dL (ref 30.0–36.0)
MCV: 72.3 fl — ABNORMAL LOW (ref 78.0–100.0)
MONOS PCT: 10.7 % (ref 3.0–12.0)
Monocytes Absolute: 0.6 10*3/uL (ref 0.1–1.0)
NEUTROS ABS: 3.5 10*3/uL (ref 1.4–7.7)
Neutrophils Relative %: 59.7 % (ref 43.0–77.0)
Platelets: 217 10*3/uL (ref 150.0–400.0)
RBC: 4.46 Mil/uL (ref 4.22–5.81)
RDW: 18.6 % — AB (ref 11.5–15.5)
WBC: 5.8 10*3/uL (ref 4.0–10.5)

## 2014-10-24 NOTE — Telephone Encounter (Signed)
Pt states he had a little bleeding last night. Pt coming in today for CBC, Dr. Hilarie Fredrickson aware.

## 2014-10-26 ENCOUNTER — Other Ambulatory Visit: Payer: Self-pay | Admitting: *Deleted

## 2014-10-26 ENCOUNTER — Encounter: Payer: Self-pay | Admitting: *Deleted

## 2014-10-26 ENCOUNTER — Ambulatory Visit (AMBULATORY_SURGERY_CENTER): Payer: Commercial Managed Care - HMO | Admitting: Internal Medicine

## 2014-10-26 ENCOUNTER — Encounter: Payer: Self-pay | Admitting: Internal Medicine

## 2014-10-26 VITALS — BP 126/79 | HR 72 | Temp 98.6°F | Resp 21 | Ht 66.0 in | Wt 219.0 lb

## 2014-10-26 DIAGNOSIS — K625 Hemorrhage of anus and rectum: Secondary | ICD-10-CM

## 2014-10-26 DIAGNOSIS — K91858 Other complications of intestinal pouch: Secondary | ICD-10-CM

## 2014-10-26 DIAGNOSIS — Z9049 Acquired absence of other specified parts of digestive tract: Secondary | ICD-10-CM

## 2014-10-26 DIAGNOSIS — Z9889 Other specified postprocedural states: Secondary | ICD-10-CM

## 2014-10-26 LAB — GLUCOSE, CAPILLARY
GLUCOSE-CAPILLARY: 129 mg/dL — AB (ref 70–99)
Glucose-Capillary: 138 mg/dL — ABNORMAL HIGH (ref 70–99)

## 2014-10-26 MED ORDER — MESALAMINE 1000 MG RE SUPP: 1000.0000 mg | Freq: Every day | RECTAL | Status: DC

## 2014-10-26 MED ORDER — SODIUM CHLORIDE 0.9 % IV SOLN: 500.0000 mL | INTRAVENOUS | Status: DC

## 2014-10-26 MED ORDER — FLEET ENEMA 7-19 GM/118ML RE ENEM
1.0000 | ENEMA | Freq: Once | RECTAL | Status: AC
  Administered 2014-10-26: 1 via RECTAL

## 2014-10-26 NOTE — Progress Notes (Signed)
Patient awakening,vss,report to rn 

## 2014-10-26 NOTE — Op Note (Signed)
[facility]  [address] [place], [id number]   FLEXIBLE SIGMOIDOSCOPY PROCEDURE REPORT  PATIENT: Manuel Grant, Manuel Grant  MR#: [id number] BIRTHDATE: February 28, 1946 , 68  yrs. old GENDER: male ENDOSCOPIST: Jerene Bears, MD PROCEDURE DATE:  10/26/2014 PROCEDURE:   Sigmoidoscopy, diagnostic ASA CLASS:   Class III INDICATIONS:rectal bleeding.   history of ulcerative colitis, s/p subtotal colectomy with J-pouch. MEDICATIONS: Monitored anesthesia care and Propofol 200 mg IV  DESCRIPTION OF PROCEDURE:   After the risks benefits and alternatives of the procedure were thoroughly explained, informed consent was obtained.     The LB [id number]  endoscope was introduced through the anus  and advanced to the surgical anastomosis , The exam was Without limitations.    The quality of the prep was The overall prep quality was adequate. .  The instrument was then slowly withdrawn as the mucosa was fully examined.  COLON FINDINGS: There was evidence of a prior ileal J-pouch formation.  The previously seen anastomotic ulcer near the top of the J-pouch is still present. It was not actively bleeding. One visible staple is seen. The neo-terminal ileum above the pouch is normal in appearance. There is a short blind stump. The J-pouch itself shows very mild erythema at the distal portion.  There is mild inflammation of the anal canal with white granulation type areas. Not actively bleeding.  The anal canal tissue and anastomotic ulcer were biopsied earlier this year and found to be benign with no evidence of dysplasia.   Retroflexion was not performed due to a narrow rectal vault.    The scope was then withdrawn from the patient and the procedure terminated.  COMPLICATIONS: There were no immediate complications.  ENDOSCOPIC IMPRESSION: There was evidence of a prior ileal J-pouch with persistent anastomotic ulceration, one visible staple. Normal examined ileum. Mild inflammation in  the distal J-pouch and anal canal with persistent granulation spots (previously biopsied)  RECOMMENDATIONS: 1.  Trial of Canasa suppository 1g every night in attempt to heal the anastomotic ulceration 2.  Repeat CBC in 1 week 3.  Office follow-up 4.  Best to avoid NSAIDs 5.  Okay to resume Xarelto given medical history with the knowledge the anastomotic ulceration has and can continue to bleed.  Will need to closely monitor Hgb   eSigned:  Jerene Bears, MD 10/26/2014 9:02 AM   [id number] Gwenlyn Found, MD The Patient Manuel Stain, MD  PATIENT NAME:  Manuel Grant, Manuel Grant MR#: [id number]

## 2014-10-26 NOTE — Progress Notes (Signed)
Pt. Given fleets enema prior to procedure.  He stated that after taking first enema this am he had cloudy results with flakes. Results of second enema, clear with A few particles,pt.tolerated well.

## 2014-10-26 NOTE — Patient Instructions (Signed)
YOU HAD AN ENDOSCOPIC PROCEDURE TODAY AT [facility]: Refer to the procedure report that was given to you for any specific questions about what was found during the examination.  If the procedure report does not answer your questions, please call your gastroenterologist to clarify.  If you requested that your care partner not be given the details of your procedure findings, then the procedure report has been included in a sealed envelope for you to review at your convenience later.  YOU SHOULD EXPECT: Some feelings of bloating in the abdomen. Passage of more gas than usual.  Walking can help get rid of the air that was put into your GI tract during the procedure and reduce the bloating. If you had a lower endoscopy (such as a colonoscopy or flexible sigmoidoscopy) you may notice spotting of blood in your stool or on the toilet paper. If you underwent a bowel prep for your procedure, then you may not have a normal bowel movement for a few days.  DIET: Your first meal following the procedure should be a light meal and then it is ok to progress to your normal diet.  A half-sandwich or bowl of soup is an example of a good first meal.  Heavy or fried foods are harder to digest and may make you feel nauseous or bloated.  Likewise meals heavy in dairy and vegetables can cause extra gas to form and this can also increase the bloating.  Drink plenty of fluids but you should avoid alcoholic beverages for 24 hours.  ACTIVITY: Your care partner should take you home directly after the procedure.  You should plan to take it easy, moving slowly for the rest of the day.  You can resume normal activity the day after the procedure however you should NOT DRIVE or use heavy machinery for 24 hours (because of the sedation medicines used during the test).    SYMPTOMS TO REPORT IMMEDIATELY: A gastroenterologist can be reached at any hour.  During normal business hours, 8:30 AM to 5:00 PM Monday through Friday,  call [phone].  After hours and on weekends, please call the GI answering service at [phone] who will take a message and have the physician on call contact you.   Following lower endoscopy (colonoscopy or flexible sigmoidoscopy):  Excessive amounts of blood in the stool  Significant tenderness or worsening of abdominal pains  Swelling of the abdomen that is new, acute  Fever of 100F or higher  F  FOLLOW UP: If any biopsies were taken you will be contacted by phone or by letter within the next 1-3 weeks.  Call your gastroenterologist if you have not heard about the biopsies in 3 weeks.  Our staff will call the home number listed on your records the next business day following your procedure to check on you and address any questions or concerns that you may have at that time regarding the information given to you following your procedure. This is a courtesy call and so if there is no answer at the home number and we have not heard from you through the emergency physician on call, we will assume that you have returned to your regular daily activities without incident.  SIGNATURES/CONFIDENTIALITY: You and/or your care partner have signed paperwork which will be entered into your electronic medical record.  These signatures attest to the fact that that the information above on your After Visit Summary has been reviewed and is understood.  Full responsibility of the confidentiality  of this discharge information lies with you and/or your care-partner.  Canasa suppository 1 gm every night   Repeat cbc in one week  Office follow up  [place] to resume Xarelto  Avoid aspirin and anti-inflammatory medications

## 2014-10-29 ENCOUNTER — Telehealth: Payer: Self-pay

## 2014-10-29 ENCOUNTER — Other Ambulatory Visit: Payer: Self-pay | Admitting: Family Medicine

## 2014-10-29 NOTE — Telephone Encounter (Signed)
  Follow up Call-  Call back number 10/26/2014  Post procedure Call Back phone  # [phone]  Permission to leave phone message Yes     Patient questions:  Do you have a fever, pain , or abdominal swelling? No. Pain Score  0 *  Have you tolerated food without any problems? Yes.    Have you been able to return to your normal activities? Yes.    Do you have any questions about your discharge instructions: Diet   No. Medications  No. Follow up visit  No.  Do you have questions or concerns about your Care? No.  Actions: * If pain score is 4 or above: No action needed, pain <4.

## 2014-11-12 ENCOUNTER — Telehealth: Payer: Self-pay | Admitting: Cardiovascular Disease

## 2014-11-12 ENCOUNTER — Other Ambulatory Visit: Payer: Self-pay

## 2014-11-12 MED ORDER — LOVASTATIN 10 MG PO TABS: 10.0000 mg | ORAL_TABLET | Freq: Every day | ORAL | Status: DC

## 2014-11-12 NOTE — Telephone Encounter (Signed)
Rx sent to pharmacy   

## 2014-11-12 NOTE — Telephone Encounter (Signed)
Spoke with pt pharm, the patient according to their records has always been on lovastatin 40 mg. Will refill for the 40 mg dosage, no record in patient chart that it has been changed.

## 2014-11-12 NOTE — Telephone Encounter (Signed)
Manuel Grant is calling for clarification on Manuel Grant Lovastatin . He has been on the 40 mg but the prescription was sent over for 10m . Please call to clarify .    Thanks

## 2014-11-14 ENCOUNTER — Other Ambulatory Visit: Payer: Self-pay | Admitting: Family Medicine

## 2014-11-14 NOTE — Telephone Encounter (Signed)
Please call in.  Looks to be due for DM2 f/u, please schedule.  And schedule CPE for spring 2016.  Thanks.

## 2014-11-14 NOTE — Telephone Encounter (Signed)
Received refill request electronically from pharmacy. Last refill 05/09/14 #30/5 refills, last office visit 04/18/14. Is it okay to refill medication?

## 2014-11-14 NOTE — Telephone Encounter (Signed)
Rx called to pharmacy.  Unable to reach patient by telephone. Please call patient and schedule appointments.

## 2014-12-11 ENCOUNTER — Other Ambulatory Visit (INDEPENDENT_AMBULATORY_CARE_PROVIDER_SITE_OTHER): Payer: Commercial Managed Care - HMO

## 2014-12-11 DIAGNOSIS — K625 Hemorrhage of anus and rectum: Secondary | ICD-10-CM

## 2014-12-11 LAB — CBC WITH DIFFERENTIAL/PLATELET
Basophils Absolute: 0 K/uL (ref 0.0–0.1)
Basophils Relative: 0.5 % (ref 0.0–3.0)
Eosinophils Absolute: 0.1 K/uL (ref 0.0–0.7)
Eosinophils Relative: 1.7 % (ref 0.0–5.0)
HCT: 33.5 % — ABNORMAL LOW (ref 39.0–52.0)
Hemoglobin: 10.2 g/dL — ABNORMAL LOW (ref 13.0–17.0)
Lymphocytes Relative: 27.6 % (ref 12.0–46.0)
Lymphs Abs: 1.8 K/uL (ref 0.7–4.0)
MCHC: 30.5 g/dL (ref 30.0–36.0)
MCV: 65.1 fl — ABNORMAL LOW (ref 78.0–100.0)
Monocytes Absolute: 0.7 K/uL (ref 0.1–1.0)
Monocytes Relative: 10 % (ref 3.0–12.0)
Neutro Abs: 4 K/uL (ref 1.4–7.7)
Neutrophils Relative %: 60.2 % (ref 43.0–77.0)
Platelets: 257 K/uL (ref 150.0–400.0)
RBC: 5.14 Mil/uL (ref 4.22–5.81)
RDW: 20.7 % — ABNORMAL HIGH (ref 11.5–15.5)
WBC: 6.7 K/uL (ref 4.0–10.5)

## 2014-12-17 ENCOUNTER — Other Ambulatory Visit: Payer: Self-pay | Admitting: *Deleted

## 2014-12-17 MED ORDER — LISINOPRIL-HYDROCHLOROTHIAZIDE 20-12.5 MG PO TABS
1.0000 | ORAL_TABLET | Freq: Every day | ORAL | Status: DC
Start: 2014-12-17 — End: 2015-12-05

## 2014-12-17 MED ORDER — ISOSORBIDE MONONITRATE ER 60 MG PO TB24: 60.0000 mg | ORAL_TABLET | Freq: Every day | ORAL | Status: DC

## 2014-12-17 NOTE — Telephone Encounter (Signed)
Rx refill sent to patient pharmacy   

## 2015-01-01 ENCOUNTER — Ambulatory Visit (INDEPENDENT_AMBULATORY_CARE_PROVIDER_SITE_OTHER): Payer: PPO | Admitting: Internal Medicine

## 2015-01-01 ENCOUNTER — Encounter: Payer: Self-pay | Admitting: Internal Medicine

## 2015-01-01 VITALS — BP 118/78 | HR 108 | Ht 66.0 in | Wt 217.2 lb

## 2015-01-01 DIAGNOSIS — Z7901 Long term (current) use of anticoagulants: Secondary | ICD-10-CM

## 2015-01-01 DIAGNOSIS — K91858 Other complications of intestinal pouch: Secondary | ICD-10-CM

## 2015-01-01 DIAGNOSIS — D509 Iron deficiency anemia, unspecified: Secondary | ICD-10-CM

## 2015-01-01 NOTE — Patient Instructions (Signed)
Please go directly to the lab in the basement in 8 weeks ( week of March 7th) to have blood work done.   Avoid all NSAID's. Continue Protonix daily and Iron 325 mg three times a day.  Please follow up with Dr. Hilarie Fredrickson in 6 months.

## 2015-01-01 NOTE — Progress Notes (Signed)
Subjective:    Patient ID: Manuel Grant, male    DOB: 1946/08/06, 69 y.o.   MRN: [id number]  HPI Manuel Grant is a 69 year old male with past medical history of also of colitis status post subtotal colectomy with J-pouch performed at [facility] in 2005, chronic anastomotic ulcer of the J-pouch, history of gastric ulcer, iron deficiency anemia, left lower extremity DVT on Xarelto, diabetes, B12 deficiency, hypertension, hyperlipidemia who is seen for follow-up. He is here today with his wife. He is feeling well. He had labs performed in the middle of December 2015 which showed persistent anemia with a hemoglobin around 10.2 but with worsening microcytosis. At that time he had been off of oral iron for some time. He resumed oral iron in mid December 3 125 mg 3 times daily. He had a repeat flexible sigmoidoscopy which was performed on 10/26/2014. This revealed anastomotic ulcer near the top of the J-pouch which was not actively bleeding. The neoterminal ileum was normal above the J-pouch. There was mild inflammation of the anal canal with white granulation tissue which was previously seen and biopsied. At that time he was given a trial of Canasa 1 g each night. He did this for 8 weeks, but the medication was very nearly prohibitively expensive. He tolerated it without issue. He reports he's had improving energy levels. Good appetite. No abdominal pain. No fevers or chills. He is having 8-10 loose bowel movements a day as he has had for years since his surgery. Stools became black again once iron was started, but prior to this he had seen no melena or red blood. He continues with Xarelto daily. He is using pantoprazole 40 mg daily which he has been on since earlier in 2015. He had an upper endoscopy performed on 03/16/2014 which revealed mild reflux esophagitis, mild erosive gastritis in the antrum with 2 small nonbleeding ulcers in the duodenal bulb. Biopsies were negative for H. pylori   Review of Systems    As per HPI, otherwise negative  Current Medications, Allergies, Past Medical History, Past Surgical History, Family History and Social History were reviewed in [employer] record.     Objective:   Physical Exam BP 118/78 mmHg  Pulse 108  Ht 5' 6"  (1.676 m)  Wt 217 lb 4 oz (98.544 kg)  BMI 35.08 kg/m2 Constitutional: Well-developed and well-nourished. No distress. HEENT: Normocephalic and atraumatic. Oropharynx is clear and moist. No oropharyngeal exudate. Conjunctivae are normal.  No scleral icterus. Neck: Neck supple. Trachea midline. Cardiovascular: Normal rate, regular rhythm and intact distal pulses. No M/R/G Pulmonary/chest: Effort normal and breath sounds normal. No wheezing, rales or rhonchi. Abdominal: Soft, nontender, nondistended. Bowel sounds active throughout. There are no masses palpable. No hepatosplenomegaly. Extremities: no clubbing, cyanosis, or edema Lymphadenopathy: No cervical adenopathy noted. Neurological: Alert and oriented to person place and time. Skin: Skin is warm and dry. No rashes noted. Psychiatric: Normal mood and affect. Behavior is normal.  CBC    Component Value Date/Time   WBC 6.7 12/11/2014 1512   WBC 4.5 05/24/2014 0817   RBC 5.14 12/11/2014 1512   RBC 4.60 05/24/2014 0817   HGB 10.2* 12/11/2014 1512   HGB 11.6* 05/24/2014 0817   HCT 33.5* 12/11/2014 1512   HCT 37.3* 05/24/2014 0817   PLT 257.0 12/11/2014 1512   PLT 152 05/24/2014 0817   MCV 65.1 Repeated and verified X2.* 12/11/2014 1512   MCV 81.1 05/24/2014 0817   MCH 25.2* 05/24/2014 0817   MCH 21.1*  04/06/2014 0605   MCHC 30.5 12/11/2014 1512   MCHC 31.1* 05/24/2014 0817   RDW 20.7* 12/11/2014 1512   RDW 26.3* 05/24/2014 0817   LYMPHSABS 1.8 12/11/2014 1512   LYMPHSABS 1.5 05/24/2014 0817   MONOABS 0.7 12/11/2014 1512   MONOABS 0.5 05/24/2014 0817   EOSABS 0.1 12/11/2014 1512   EOSABS 0.2 05/24/2014 0817   BASOSABS 0.0 12/11/2014 1512   BASOSABS 0.0  05/24/2014 0817    Iron/TIBC/Ferritin/ %Sat    Component Value Date/Time   IRON 49 04/30/2014 1543   IRON 17* 03/15/2014 1600   TIBC 274 04/30/2014 1543   TIBC 405 03/15/2014 1600   FERRITIN 206 04/30/2014 1543   FERRITIN 6* 03/15/2014 1600   IRONPCTSAT 18* 04/30/2014 1543   IRONPCTSAT 4* 03/15/2014 1600       Assessment & Plan:  69 year old male with past medical history of also of colitis status post subtotal colectomy with J-pouch performed at [facility] in 2005, chronic anastomotic ulcer of the J-pouch, history of gastric ulcer, iron deficiency anemia, left lower extremity DVT on Xarelto, diabetes, B12 deficiency, hypertension, hyperlipidemia who is seen for follow-up.  1. Anastomotic ulcer of J-pouch in patient with colectomy for ulcerative colitis, iron deficiency anemia -- he has persistent anastomotic ulceration, previously biopsied and found to be benign. This is felt most likely source for persistent anemia. Blood counts have remained stable but increasingly microcytic. He resumed oral iron about 3 weeks ago. Canasa was too expensive to maintain on a daily basis, which I think would be ideal. We will continue oral iron 325 mg 3 times daily. Avoid NSAIDs. Repeat CBC and iron studies in early March. If blood counts and iron studies have failed to normalize, I will refer for IV iron. Carafate enemas could be considered if rebleeding occurs. Surgical correction of anastomotic ulcer would be a last resort, which he completely agrees with.  2. History of duodenal ulceration, H. pylori negative -- he has been maintain on PPI and avoiding NSAIDs. Continue pantoprazole 40 mg daily. I do recommend repeat upper endoscopy in the next 1-2 years given history of gastric metaplasia.  3. History of DVT -- continue Xarelto, though this certainly does not help his chronic nonhealing anastomotic ulceration. Hopefully we will be able to overcome any ongoing blood loss with iron supplementation.  6 month  OV, sooner if needed

## 2015-01-10 ENCOUNTER — Encounter (HOSPITAL_COMMUNITY): Payer: Self-pay | Admitting: Cardiovascular Disease

## 2015-01-17 ENCOUNTER — Other Ambulatory Visit: Payer: Self-pay | Admitting: *Deleted

## 2015-01-17 MED ORDER — PANTOPRAZOLE SODIUM 40 MG PO TBEC
40.0000 mg | DELAYED_RELEASE_TABLET | Freq: Every day | ORAL | Status: DC
Start: 2015-01-17 — End: 2015-08-07

## 2015-02-08 ENCOUNTER — Telehealth: Payer: Self-pay | Admitting: Internal Medicine

## 2015-02-08 ENCOUNTER — Inpatient Hospital Stay (HOSPITAL_COMMUNITY)
Admission: EM | Admit: 2015-02-08 | Discharge: 2015-02-11 | DRG: 386 | Disposition: A | Discharge status: Home / Self Care | Payer: PPO | Attending: Internal Medicine | Admitting: Internal Medicine

## 2015-02-08 ENCOUNTER — Encounter (HOSPITAL_COMMUNITY): Payer: Self-pay | Admitting: Emergency Medicine

## 2015-02-08 DIAGNOSIS — Z87442 Personal history of urinary calculi: Secondary | ICD-10-CM | POA: Diagnosis not present

## 2015-02-08 DIAGNOSIS — Z79891 Long term (current) use of opiate analgesic: Secondary | ICD-10-CM | POA: Diagnosis not present

## 2015-02-08 DIAGNOSIS — E785 Hyperlipidemia, unspecified: Secondary | ICD-10-CM | POA: Diagnosis present

## 2015-02-08 DIAGNOSIS — K51911 Ulcerative colitis, unspecified with rectal bleeding: Principal | ICD-10-CM | POA: Diagnosis present

## 2015-02-08 DIAGNOSIS — Z9049 Acquired absence of other specified parts of digestive tract: Secondary | ICD-10-CM | POA: Diagnosis present

## 2015-02-08 DIAGNOSIS — Z8711 Personal history of peptic ulcer disease: Secondary | ICD-10-CM

## 2015-02-08 DIAGNOSIS — M179 Osteoarthritis of knee, unspecified: Secondary | ICD-10-CM | POA: Diagnosis present

## 2015-02-08 DIAGNOSIS — N183 Chronic kidney disease, stage 3 (moderate): Secondary | ICD-10-CM | POA: Diagnosis present

## 2015-02-08 DIAGNOSIS — K625 Hemorrhage of anus and rectum: Secondary | ICD-10-CM

## 2015-02-08 DIAGNOSIS — Z87891 Personal history of nicotine dependence: Secondary | ICD-10-CM

## 2015-02-08 DIAGNOSIS — E7800 Pure hypercholesterolemia, unspecified: Secondary | ICD-10-CM | POA: Diagnosis present

## 2015-02-08 DIAGNOSIS — I82402 Acute embolism and thrombosis of unspecified deep veins of left lower extremity: Secondary | ICD-10-CM

## 2015-02-08 DIAGNOSIS — M1990 Unspecified osteoarthritis, unspecified site: Secondary | ICD-10-CM | POA: Diagnosis present

## 2015-02-08 DIAGNOSIS — E538 Deficiency of other specified B group vitamins: Secondary | ICD-10-CM | POA: Diagnosis present

## 2015-02-08 DIAGNOSIS — Z8249 Family history of ischemic heart disease and other diseases of the circulatory system: Secondary | ICD-10-CM | POA: Diagnosis not present

## 2015-02-08 DIAGNOSIS — K219 Gastro-esophageal reflux disease without esophagitis: Secondary | ICD-10-CM | POA: Diagnosis present

## 2015-02-08 DIAGNOSIS — Z833 Family history of diabetes mellitus: Secondary | ICD-10-CM

## 2015-02-08 DIAGNOSIS — Z86010 Personal history of colonic polyps: Secondary | ICD-10-CM

## 2015-02-08 DIAGNOSIS — Z825 Family history of asthma and other chronic lower respiratory diseases: Secondary | ICD-10-CM

## 2015-02-08 DIAGNOSIS — I82502 Chronic embolism and thrombosis of unspecified deep veins of left lower extremity: Secondary | ICD-10-CM | POA: Diagnosis present

## 2015-02-08 DIAGNOSIS — K269 Duodenal ulcer, unspecified as acute or chronic, without hemorrhage or perforation: Secondary | ICD-10-CM | POA: Diagnosis present

## 2015-02-08 DIAGNOSIS — E1140 Type 2 diabetes mellitus with diabetic neuropathy, unspecified: Secondary | ICD-10-CM

## 2015-02-08 DIAGNOSIS — Z7901 Long term (current) use of anticoagulants: Secondary | ICD-10-CM | POA: Diagnosis not present

## 2015-02-08 DIAGNOSIS — D62 Acute posthemorrhagic anemia: Secondary | ICD-10-CM | POA: Diagnosis present

## 2015-02-08 DIAGNOSIS — I129 Hypertensive chronic kidney disease with stage 1 through stage 4 chronic kidney disease, or unspecified chronic kidney disease: Secondary | ICD-10-CM | POA: Diagnosis present

## 2015-02-08 DIAGNOSIS — E1149 Type 2 diabetes mellitus with other diabetic neurological complication: Secondary | ICD-10-CM | POA: Diagnosis present

## 2015-02-08 DIAGNOSIS — I252 Old myocardial infarction: Secondary | ICD-10-CM

## 2015-02-08 DIAGNOSIS — I10 Essential (primary) hypertension: Secondary | ICD-10-CM | POA: Diagnosis present

## 2015-02-08 DIAGNOSIS — I2510 Atherosclerotic heart disease of native coronary artery without angina pectoris: Secondary | ICD-10-CM | POA: Diagnosis present

## 2015-02-08 DIAGNOSIS — Z79899 Other long term (current) drug therapy: Secondary | ICD-10-CM | POA: Diagnosis not present

## 2015-02-08 DIAGNOSIS — I82409 Acute embolism and thrombosis of unspecified deep veins of unspecified lower extremity: Secondary | ICD-10-CM | POA: Diagnosis present

## 2015-02-08 DIAGNOSIS — Z8371 Family history of colonic polyps: Secondary | ICD-10-CM

## 2015-02-08 LAB — COMPREHENSIVE METABOLIC PANEL
ALT: 26 U/L (ref 0–53)
AST: 33 U/L (ref 0–37)
Albumin: 4.3 g/dL (ref 3.5–5.2)
Alkaline Phosphatase: 67 U/L (ref 39–117)
Anion gap: 10 (ref 5–15)
BUN: 19 mg/dL (ref 6–23)
CO2: 26 mmol/L (ref 19–32)
CREATININE: 1.5 mg/dL — AB (ref 0.50–1.35)
Calcium: 9.6 mg/dL (ref 8.4–10.5)
Chloride: 103 mmol/L (ref 96–112)
GFR calc Af Amer: 53 mL/min — ABNORMAL LOW (ref >90)
GFR, EST NON AFRICAN AMERICAN: 46 mL/min — AB (ref >90)
Glucose, Bld: 213 mg/dL — ABNORMAL HIGH (ref 70–99)
Potassium: 3.5 mmol/L (ref 3.5–5.1)
Sodium: 139 mmol/L (ref 135–145)
TOTAL PROTEIN: 7.8 g/dL (ref 6.0–8.3)
Total Bilirubin: 0.5 mg/dL (ref 0.3–1.2)

## 2015-02-08 LAB — CBC
HCT: 43.2 % (ref 39.0–52.0)
HEMOGLOBIN: 13.7 g/dL (ref 13.0–17.0)
MCH: 26.4 pg (ref 26.0–34.0)
MCHC: 31.7 g/dL (ref 30.0–36.0)
MCV: 83.4 fL (ref 78.0–100.0)
Platelets: 142 10*3/uL — ABNORMAL LOW (ref 150–400)
RBC: 5.18 MIL/uL (ref 4.22–5.81)
WBC: 5.1 10*3/uL (ref 4.0–10.5)

## 2015-02-08 LAB — PROTIME-INR
INR: 1.08 (ref 0.00–1.49)
Prothrombin Time: 14.1 seconds (ref 11.6–15.2)

## 2015-02-08 LAB — APTT: aPTT: 30 seconds (ref 24–37)

## 2015-02-08 MED ORDER — SODIUM CHLORIDE 0.9 % IV SOLN
INTRAVENOUS | Status: DC
  Administered 2015-02-08: 21:00:00 via INTRAVENOUS

## 2015-02-08 NOTE — ED Provider Notes (Signed)
CSN: [id number]     Arrival date & time 02/08/15  1947 History   First MD Initiated Contact with Patient 02/08/15 2018     Chief Complaint  Patient presents with  . GI Bleeding     (Consider location/radiation/quality/duration/timing/severity/associated sxs/prior Treatment) HPI Comments: Patient here complaining of rectal bleeding that began today. Marzetta Board he passed approximately 2 teaspoons of blood which was then followed by a copious amount which turned the water bloody. Prior history of ulcerative colitis and is status post colectomy. Denies any fever or chills. No abdominal pain. Symptoms temporarily resolved. No treatment for this use prior to arrival. Denies any weakness. Denies any near syncope  The history is provided by the patient.    Past Medical History  Diagnosis Date  . Ulcerative colitis     s/p colectomy ~ 2005 at [facility]  . Renal stones     uric acid stones  . Chronic kidney disease   . Hypertension   . Hyperlipidemia   . Rosacea   . Insomnia   . Hiatal hernia   . Arthritis     Knee OA- per [facility]- injected prev by ortho  . Coronary artery disease     a. 10/1998 Cath: LM 30d, LAD 39m OM 50, RCA 100 w/ L->R collats, nl LV ->Med Rx;  b. 07/2010 MV: no ischemia.  .[name] KitchenB12 deficiency   . Osteoarthritis   . Type II diabetes mellitus   . Myocardial infarction 1999    "heart occasion" (03/15/2014)  . Pneumonia 1970's  . Anemia   . Deep venous thrombosis     left lower extremity  . GI bleed   . Colon polyps    Past Surgical History  Procedure Laterality Date  . Subtotal colectomy  2005    [facility] . Knee arthroscopy Bilateral 1990's  . Hernia repair Right ~ 2006    "abdomen; was as a result of a surgery"  . Cardiac catheterization  11/15/1998    30% distal left main, 75% mid LAD, 50% OM and total RCA with left to right collateral and normal LV function.  . Esophagogastroduodenoscopy N/A 03/16/2014    Procedure:  ESOPHAGOGASTRODUODENOSCOPY (EGD);  Surgeon: JJerene Bears MD;  [facility]  Service: Endoscopy;  Laterality: N/A;  . Flexible sigmoidoscopy N/A 03/16/2014    Procedure: FLEXIBLE SIGMOIDOSCOPY;  Surgeon: JJerene Bears MD;  [facility];  Service: Endoscopy;  Laterality: N/A;  . UKoreaechocardiography  11/16/2007    mild concentric LVH,impaired LV relaxagtion,boderline RV enlargement,nodular thickening of the noncoronary cusup  . Nm myocar perf wall motion  08/06/2010    normal   Family History  Problem Relation Age of Onset  . Heart disease Mother   . Diabetes Mother   . COPD Father   . Colon cancer Neg Hx   . Prostate cancer Neg Hx   . Colon polyps Brother   . Emphysema Father    History  Substance Use Topics  . Smoking status: Former Smoker -- 2.00 packs/day for 10 years    Types: Cigarettes    Quit date: 12/28/1974  . Smokeless tobacco: Never Used  . Alcohol Use: Yes     Comment: 03/15/2014 "6 pack beer or less/yr"    Review of Systems  All other systems reviewed and are negative.     Allergies  Review of patient's allergies indicates no known allergies.  Home Medications   Prior to Admission medications   Medication  Sig Start Date End Date Taking? Authorizing Provider  glipiZIDE (GLUCOTROL XL) 10 MG 24 hr tablet TAKE 1 TABLET BY MOUTH ONCE A DAY 05/14/14  Yes Tonia Ghent, MD  isosorbide mononitrate (IMDUR) 60 MG 24 hr tablet Take 1 tablet (60 mg total) by mouth daily. 12/17/14  Yes [name], MD  lisinopril-hydrochlorothiazide (PRINZIDE,ZESTORETIC) 20-12.5 MG per tablet Take 1 tablet by mouth daily. 12/17/14  Yes [name], MD  lovastatin (MEVACOR) 10 MG tablet Take 1 tablet (10 mg total) by mouth at bedtime. 11/12/14  Yes [name], MD  metFORMIN (GLUCOPHAGE) 1000 MG tablet Take 1 tablet (1,000 mg total) by mouth 2 (two) times daily. 04/09/14  Yes Thurnell Lose, MD  metoprolol succinate (TOPROL-XL) 50 MG 24 hr tablet Take 1  1/2 tablets daily. Take with or immediately following a meal. Patient taking differently: Take 75 mg by mouth daily. Take 1 1/2 tablets (75 mg) By Mouth Daily. Take with or immediately following a meal. 06/15/14  Yes [name], MD  minocycline (MINOCIN,DYNACIN) 50 MG capsule Take 1 capsule by mouth daily. 09/04/14  Yes Historical Provider, MD  pantoprazole (PROTONIX) 40 MG tablet Take 1 tablet (40 mg total) by mouth daily. 01/17/15  Yes Jerene Bears, MD  traMADol (ULTRAM) 50 MG tablet Take 100 mg by mouth 3 (three) times daily.    Yes Historical Provider, MD  XARELTO 20 MG TABS tablet TAKE 1 TABLET BY MOUTH ONCE A DAY WITH SUPPER 10/29/14  Yes [name], MD  zolpidem (AMBIEN) 10 MG tablet TAKE 1 TABLET BY MOUTH EVERY NIGHT AT BEDTIME AS NEEDED FOR SLEEP 11/14/14  Yes Tonia Ghent, MD  cyanocobalamin (,VITAMIN B-12,) 1000 MCG/ML injection INJECT 1ML INTO THE MUSCLE EVERY 30 DAYS 09/28/14   Tonia Ghent, MD   BP 168/101 mmHg  Pulse 96  Temp(Src) 98.1 F (36.7 C) (Oral)  Resp 20  SpO2 100% Physical Exam  Constitutional: He is oriented to person, place, and time. He appears well-developed and well-nourished.  Non-toxic appearance. No distress.  HENT:  Head: Normocephalic and atraumatic.  Eyes: Conjunctivae, EOM and lids are normal. Pupils are equal, round, and reactive to light.  Neck: Normal range of motion. Neck supple. No tracheal deviation present. No thyroid mass present.  Cardiovascular: Normal rate, regular rhythm and normal heart sounds.  Exam reveals no gallop.   No murmur heard. Pulmonary/Chest: Effort normal and breath sounds normal. No stridor. No respiratory distress. He has no decreased breath sounds. He has no wheezes. He has no rhonchi. He has no rales.  Abdominal: Soft. Normal appearance and bowel sounds are normal. He exhibits no distension. There is no tenderness. There is no rebound and no CVA tenderness.  Genitourinary: Rectal exam shows tenderness. Rectal exam  shows no external hemorrhoid and no internal hemorrhoid.  Some gross blood noted  Musculoskeletal: Normal range of motion. He exhibits no edema or tenderness.  Neurological: He is alert and oriented to person, place, and time. He has normal strength. No cranial nerve deficit or sensory deficit. GCS eye subscore is 4. GCS verbal subscore is 5. GCS motor subscore is 6.  Skin: Skin is warm and dry. No abrasion and no rash noted.  Psychiatric: He has a normal mood and affect. His speech is normal and behavior is normal.  Nursing note and vitals reviewed.   ED Course  Procedures (including critical care time) Labs Review Labs Reviewed  CBC  COMPREHENSIVE METABOLIC PANEL  PROTIME-INR  APTT  TYPE AND SCREEN    Imaging Review No results found.   EKG Interpretation None      MDM   Final diagnoses:  None    Patient to be admitted for evaluation GI bleed    Leota Jacobsen, MD 02/08/15 2136

## 2015-02-08 NOTE — Telephone Encounter (Signed)
Noted  

## 2015-02-08 NOTE — ED Notes (Signed)
Pt reports he is having lower GI Bleed. Pt states he is on iron and stopped taking the pill for a couple of days and has noticed maroon blood with bowel movements. Pt states he has hx of ulcerative colitis but colon has been removed. Pt denies any pain and denies n/v. Pt reports he is feeling a little weak.

## 2015-02-08 NOTE — H&P (Signed)
[facility] History and Physical  Manuel Grant YQM:[id number] DOB: 1946/01/31 DOA: 02/08/2015  Referring physician: ED physician PCP: [name], MD  Specialists:   Chief Complaint: rectal bleeding  HPI: Manuel Grant is a 69 y.o. male with past medical history of DVT on Xarelto, history of GI bleeding secondary to duodenal ulcer, history of ulcerative colitis (post status of total colectomy), vitamin B12 deficiency, hypertension, hyperlipidemia, GERD chronic kidney disease-stage III, coronary artery disease, diabetes mellitus, who presents with rectal bleeding.  Patient reports that since this morning he has had 3 times of rectal bleeding with small amount of bright red blood. Patient is asymptomatic. He does not have nausea, vomiting, abdominal pain or diarrhea. No fever or chills. No dizziness, lightheadedness, chest pain or shortness of breath. No dysuria, urgency, frequency, hematuria, skin rashes, joint pain or leg swelling. No unilateral weakness, numbness or tingling sensations. No vision change or hearing loss.  In ED, patient was found to have hemoglobin 13.7. Slightly worsening renal function, temperature normal, mildly tachycardia. Patient is admitted to inpatient for further evaluation and treatment.  Review of Systems: As presented in the history of presenting illness, rest negative.  Where does patient live? At home Can patient participate in ADLs? Yes  Allergy: No Known Allergies  Past Medical History  Diagnosis Date  . Ulcerative colitis     s/p colectomy ~ 2005 at [facility]  . Renal stones     uric acid stones  . Chronic kidney disease   . Hypertension   . Hyperlipidemia   . Rosacea   . Insomnia   . Hiatal hernia   . Arthritis     Knee OA- per [facility]- injected prev by ortho  . Coronary artery disease     a. 10/1998 Cath: LM 30d, LAD 51m OM 50, RCA 100 w/ L->R collats, nl LV ->Med Rx;  b. 07/2010 MV: no ischemia.  .[name] KitchenB12 deficiency   .  Osteoarthritis   . Type II diabetes mellitus   . Myocardial infarction 1999    "heart occasion" (03/15/2014)  . Pneumonia 1970's  . Anemia   . Deep venous thrombosis     left lower extremity  . GI bleed   . Colon polyps     Past Surgical History  Procedure Laterality Date  . Subtotal colectomy  2005    [facility] . Knee arthroscopy Bilateral 1990's  . Hernia repair Right ~ 2006    "abdomen; was as a result of a surgery"  . Cardiac catheterization  11/15/1998    30% distal left main, 75% mid LAD, 50% OM and total RCA with left to right collateral and normal LV function.  . Esophagogastroduodenoscopy N/A 03/16/2014    Procedure: ESOPHAGOGASTRODUODENOSCOPY (EGD);  Surgeon: JJerene Bears MD;  [facility];  Service: Endoscopy;  Laterality: N/A;  . Flexible sigmoidoscopy N/A 03/16/2014    Procedure: FLEXIBLE SIGMOIDOSCOPY;  Surgeon: JJerene Bears MD;  [facility];  Service: Endoscopy;  Laterality: N/A;  . UKoreaechocardiography  11/16/2007    mild concentric LVH,impaired LV relaxagtion,boderline RV enlargement,nodular thickening of the noncoronary cusup  . Nm myocar perf wall motion  08/06/2010    normal    Social History:  reports that he quit smoking about 40 years ago. His smoking use included Cigarettes. He has a 20 pack-year smoking history. He has never used smokeless tobacco. He reports that he drinks alcohol. He reports that he does not use illicit  drugs.  Family History:  Family History  Problem Relation Age of Onset  . Heart disease Mother   . Diabetes Mother   . COPD Father   . Colon cancer Neg Hx   . Prostate cancer Neg Hx   . Colon polyps Brother   . Emphysema Father      Prior to Admission medications   Medication Sig Start Date End Date Taking? Authorizing Provider  glipiZIDE (GLUCOTROL XL) 10 MG 24 hr tablet TAKE 1 TABLET BY MOUTH ONCE A DAY 05/14/14  Yes Tonia Ghent, MD  isosorbide mononitrate (IMDUR) 60 MG 24 hr tablet  Take 1 tablet (60 mg total) by mouth daily. 12/17/14  Yes [name], MD  lisinopril-hydrochlorothiazide (PRINZIDE,ZESTORETIC) 20-12.5 MG per tablet Take 1 tablet by mouth daily. 12/17/14  Yes [name], MD  lovastatin (MEVACOR) 10 MG tablet Take 1 tablet (10 mg total) by mouth at bedtime. 11/12/14  Yes [name], MD  metFORMIN (GLUCOPHAGE) 1000 MG tablet Take 1 tablet (1,000 mg total) by mouth 2 (two) times daily. 04/09/14  Yes Thurnell Lose, MD  metoprolol succinate (TOPROL-XL) 50 MG 24 hr tablet Take 1 1/2 tablets daily. Take with or immediately following a meal. Patient taking differently: Take 75 mg by mouth daily. Take 1 1/2 tablets (75 mg) By Mouth Daily. Take with or immediately following a meal. 06/15/14  Yes [name], MD  minocycline (MINOCIN,DYNACIN) 50 MG capsule Take 1 capsule by mouth daily. 09/04/14  Yes Historical Provider, MD  pantoprazole (PROTONIX) 40 MG tablet Take 1 tablet (40 mg total) by mouth daily. 01/17/15  Yes Jerene Bears, MD  traMADol (ULTRAM) 50 MG tablet Take 100 mg by mouth 3 (three) times daily.    Yes Historical Provider, MD  XARELTO 20 MG TABS tablet TAKE 1 TABLET BY MOUTH ONCE A DAY WITH SUPPER 10/29/14  Yes [name], MD  zolpidem (AMBIEN) 10 MG tablet TAKE 1 TABLET BY MOUTH EVERY NIGHT AT BEDTIME AS NEEDED FOR SLEEP 11/14/14  Yes Tonia Ghent, MD  cyanocobalamin (,VITAMIN B-12,) 1000 MCG/ML injection INJECT 1ML INTO THE MUSCLE EVERY 30 DAYS 09/28/14   Tonia Ghent, MD    Physical Exam: Filed Vitals:   02/08/15 2130 02/08/15 2200 02/08/15 2202 02/08/15 2255  BP: 141/83 153/111  160/95  Pulse: 94 101  91  Temp:   98.5 F (36.9 C) 99 F (37.2 C)  TempSrc:   Oral Oral  Resp: 26 22  20   Height:    5' 6"  (1.676 m)  Weight:    97.8 kg (215 lb 9.8 oz)  SpO2: 97% 98%  96%   General: Not in acute distress HEENT:       Eyes: PERRL, EOMI, no scleral icterus       ENT: No discharge from the ears and nose, no pharynx  injection, no tonsillar enlargement.        Neck: No JVD, no bruit, no mass felt. Cardiac: S1/S2, RRR, No murmurs, No gallops or rubs Pulm: Good air movement bilaterally. Clear to auscultation bilaterally. No rales, wheezing, rhonchi or rubs. Abd: Soft, nondistended, nontender, no rebound pain, no organomegaly, BS present Ext: No edema bilaterally. 2+DP/PT pulse bilaterally. There is mild tenderness over L calf area Musculoskeletal: No joint deformities, erythema, or stiffness, ROM full Skin: No rashes.  Neuro: Alert and oriented X3, cranial nerves II-XII grossly intact, muscle strength 5/5 in all extremeties, sensation to light touch intact.  Psych: Patient is  not psychotic, no suicidal or hemocidal ideation.  Labs on Admission:  Basic Metabolic Panel:  Recent Labs Lab 02/08/15 1955  NA 139  K 3.5  CL 103  CO2 26  GLUCOSE 213*  BUN 19  CREATININE 1.50*  CALCIUM 9.6   Liver Function Tests:  Recent Labs Lab 02/08/15 1955  AST 33  ALT 26  ALKPHOS 67  BILITOT 0.5  PROT 7.8  ALBUMIN 4.3   No results for input(s): LIPASE, AMYLASE in the last 168 hours. No results for input(s): AMMONIA in the last 168 hours. CBC:  Recent Labs Lab 02/08/15 1955  WBC 5.1  HGB 13.7  HCT 43.2  MCV 83.4  PLT 142*   Cardiac Enzymes: No results for input(s): CKTOTAL, CKMB, CKMBINDEX, TROPONINI in the last 168 hours.  BNP (last 3 results) No results for input(s): BNP in the last 8760 hours.  ProBNP (last 3 results) No results for input(s): PROBNP in the last 8760 hours.  CBG: No results for input(s): GLUCAP in the last 168 hours.  Radiological Exams on Admission: No results found.  EKG: will get one  Assessment/Plan Principal Problem:   Rectal bleeding Active Problems:   Diabetes mellitus type 2 with neurological manifestations   Essential hypertension, benign   Pure hypercholesterolemia   H/O colectomy   CAD (coronary artery disease)   Osteoarthritis   Duodenal ulcer  disease   DVT (deep venous thrombosis)   Rectal bleed  Rectal Bleeding: Patient has a painless rectal bleeding, possibly due to diverticulosis. Patient also has history of GI bleeding secondary to duodenal ulcer, therefore this is another possibility. He is s/p of colectomy, unlikely to have colon cancer. Currently patient is hemodynamically stable.  -admit to med-surg bed - hold Xarelto - NPO - NS at 100 mL/hr - Start IV pantoprazole 40 mg bib  - Avoid NSAIDs and SQ heparin - Monitor closely and follow q6h cbc, transfuse as necessary. - LaB: INR, PTT  Diabetes mellitus: A1c 6.2 on 04/06/14. She is on metformin and glipizide at home. Deficit with the 2 sliding scale insulin  Hypertension:  -hold Prinzide due to worsening renal function -Continue metoprolol -Start amlodipine  History of duodenal ulcer: -Protonix IV  DVT-left leg: Diagnosed on 02/2014. Still on Xarelto -Hold Xarelto  CAD: No chest pain -Continue metoprolol, Imdur, pravastatin -not good candidate for aspirin due to duodenal ulcer -check EKG  CKD-III: Creatinine is slightly up from a baseline 1.1-1.4 to1.5, very mildly worsening, likely due to prerenal failure. -IV fluid -check FeUrea    DVT ppx: SCD  Code Status: Full code Family Communication:  Yes, patient's   wife    at bed side Disposition Plan: Admit to inpatient   Date of Service 02/09/2015    Ivor Costa [facility] Pager [phone]  If 7PM-7AM, please contact night-coverage [email or web address] Password [facility] 02/09/2015, 3:14 AM

## 2015-02-08 NOTE — Telephone Encounter (Signed)
Patient calling with rectal bleeding with the last 2 bowel movements. States he takes iron and stools are black. He saw bleeding in stool and when he wipes. Recommended he go to ED to be evaluated.

## 2015-02-08 NOTE — ED Notes (Signed)
Pt is on Xarelto.  Pt c/o initial bleeding was about a couple of teaspoonfuls of blood.  Second bowel movement was more blood seen in stool and Third bowel movement made the toilet water cloud and dark red blood was on tissue.[name]

## 2015-02-09 DIAGNOSIS — Z9889 Other specified postprocedural states: Secondary | ICD-10-CM

## 2015-02-09 DIAGNOSIS — Z86718 Personal history of other venous thrombosis and embolism: Secondary | ICD-10-CM

## 2015-02-09 LAB — CBC
HCT: 42.9 % (ref 39.0–52.0)
HCT: 42.6 % (ref 39.0–52.0)
HCT: 42.2 % (ref 39.0–52.0)
HCT: 41 % (ref 39.0–52.0)
HEMOGLOBIN: 13.6 g/dL (ref 13.0–17.0)
HEMOGLOBIN: 13.4 g/dL (ref 13.0–17.0)
HEMOGLOBIN: 12.8 g/dL — AB (ref 13.0–17.0)
Hemoglobin: 13.6 g/dL (ref 13.0–17.0)
MCH: 26.4 pg (ref 26.0–34.0)
MCH: 26.5 pg (ref 26.0–34.0)
MCH: 26.4 pg (ref 26.0–34.0)
MCH: 26.3 pg (ref 26.0–34.0)
MCHC: 31.7 g/dL (ref 30.0–36.0)
MCHC: 31.9 g/dL (ref 30.0–36.0)
MCHC: 31.8 g/dL (ref 30.0–36.0)
MCHC: 31.2 g/dL (ref 30.0–36.0)
MCV: 83.3 fL (ref 78.0–100.0)
MCV: 82.9 fL (ref 78.0–100.0)
MCV: 83.1 fL (ref 78.0–100.0)
MCV: 84.4 fL (ref 78.0–100.0)
PLATELETS: 133 10*3/uL — AB (ref 150–400)
Platelets: 145 10*3/uL — ABNORMAL LOW (ref 150–400)
Platelets: 151 10*3/uL (ref 150–400)
Platelets: 143 10*3/uL — ABNORMAL LOW (ref 150–400)
RBC: 5.15 MIL/uL (ref 4.22–5.81)
RBC: 5.14 MIL/uL (ref 4.22–5.81)
RBC: 5.08 MIL/uL (ref 4.22–5.81)
RBC: 4.86 MIL/uL (ref 4.22–5.81)
RDW: 26.4 % — ABNORMAL HIGH (ref 11.5–15.5)
WBC: 6 10*3/uL (ref 4.0–10.5)
WBC: 6.4 10*3/uL (ref 4.0–10.5)
WBC: 5.9 10*3/uL (ref 4.0–10.5)
WBC: 5.1 10*3/uL (ref 4.0–10.5)

## 2015-02-09 LAB — BASIC METABOLIC PANEL
ANION GAP: 8 (ref 5–15)
BUN: 16 mg/dL (ref 6–23)
CO2: 27 mmol/L (ref 19–32)
CREATININE: 1.27 mg/dL (ref 0.50–1.35)
Calcium: 9.5 mg/dL (ref 8.4–10.5)
Chloride: 105 mmol/L (ref 96–112)
GFR calc Af Amer: 65 mL/min — ABNORMAL LOW (ref >90)
GFR calc non Af Amer: 56 mL/min — ABNORMAL LOW (ref >90)
Glucose, Bld: 233 mg/dL — ABNORMAL HIGH (ref 70–99)
Potassium: 3.3 mmol/L — ABNORMAL LOW (ref 3.5–5.1)
SODIUM: 140 mmol/L (ref 135–145)

## 2015-02-09 LAB — TYPE AND SCREEN
ABO/RH(D): B NEG
Antibody Screen: NEGATIVE

## 2015-02-09 LAB — GLUCOSE, CAPILLARY
GLUCOSE-CAPILLARY: 124 mg/dL — AB (ref 70–99)
GLUCOSE-CAPILLARY: 132 mg/dL — AB (ref 70–99)
Glucose-Capillary: 93 mg/dL (ref 70–99)
Glucose-Capillary: 88 mg/dL (ref 70–99)

## 2015-02-09 LAB — ABO/RH: ABO/RH(D): B NEG

## 2015-02-09 MED ORDER — PRAVASTATIN SODIUM 10 MG PO TABS
10.0000 mg | ORAL_TABLET | Freq: Every day | ORAL | Status: DC
  Administered 2015-02-09 – 2015-02-10 (×2): 10 mg via ORAL
  Filled 2015-02-09 (×3): qty 1

## 2015-02-09 MED ORDER — ZOLPIDEM TARTRATE 5 MG PO TABS
5.0000 mg | ORAL_TABLET | Freq: Every evening | ORAL | Status: DC | PRN
  Administered 2015-02-09 – 2015-02-11 (×3): 5 mg via ORAL
  Filled 2015-02-09 (×3): qty 1

## 2015-02-09 MED ORDER — CETYLPYRIDINIUM CHLORIDE 0.05 % MT LIQD
7.0000 mL | Freq: Two times a day (BID) | OROMUCOSAL | Status: DC
  Administered 2015-02-09 – 2015-02-10 (×4): 7 mL via OROMUCOSAL

## 2015-02-09 MED ORDER — METOPROLOL SUCCINATE ER 50 MG PO TB24
75.0000 mg | ORAL_TABLET | Freq: Every evening | ORAL | Status: DC
  Administered 2015-02-09 – 2015-02-10 (×3): 75 mg via ORAL
  Filled 2015-02-09 (×4): qty 1

## 2015-02-09 MED ORDER — PANTOPRAZOLE SODIUM 40 MG IV SOLR
40.0000 mg | Freq: Two times a day (BID) | INTRAVENOUS | Status: DC
  Administered 2015-02-09 – 2015-02-10 (×5): 40 mg via INTRAVENOUS
  Filled 2015-02-09 (×7): qty 40

## 2015-02-09 MED ORDER — ISOSORBIDE MONONITRATE ER 60 MG PO TB24
60.0000 mg | ORAL_TABLET | Freq: Every day | ORAL | Status: DC
  Administered 2015-02-09 – 2015-02-10 (×2): 60 mg via ORAL
  Filled 2015-02-09 (×3): qty 1

## 2015-02-09 MED ORDER — SODIUM CHLORIDE 0.9 % IV SOLN
INTRAVENOUS | Status: DC
  Administered 2015-02-09: 100 mL/h via INTRAVENOUS
  Administered 2015-02-09: 100 mL via INTRAVENOUS
  Administered 2015-02-09: 100 mL/h via INTRAVENOUS
  Administered 2015-02-10: 12:00:00 via INTRAVENOUS

## 2015-02-09 MED ORDER — TRAMADOL HCL 50 MG PO TABS
100.0000 mg | ORAL_TABLET | Freq: Three times a day (TID) | ORAL | Status: DC
  Administered 2015-02-09 – 2015-02-10 (×6): 100 mg via ORAL
  Filled 2015-02-09 (×6): qty 2

## 2015-02-09 MED ORDER — MINOCYCLINE HCL 50 MG PO CAPS
50.0000 mg | ORAL_CAPSULE | Freq: Every day | ORAL | Status: DC
  Administered 2015-02-09 – 2015-02-10 (×2): 50 mg via ORAL
  Filled 2015-02-09 (×3): qty 1

## 2015-02-09 MED ORDER — AMLODIPINE BESYLATE 10 MG PO TABS
10.0000 mg | ORAL_TABLET | Freq: Every day | ORAL | Status: DC
  Administered 2015-02-09 – 2015-02-10 (×2): 10 mg via ORAL
  Filled 2015-02-09 (×3): qty 1

## 2015-02-09 MED ORDER — POTASSIUM CHLORIDE CRYS ER 20 MEQ PO TBCR
40.0000 meq | EXTENDED_RELEASE_TABLET | ORAL | Status: AC
  Administered 2015-02-09 (×2): 40 meq via ORAL
  Filled 2015-02-09 (×2): qty 2

## 2015-02-09 MED ORDER — ZOLPIDEM TARTRATE 10 MG PO TABS: 10.0000 mg | ORAL_TABLET | Freq: Every evening | ORAL | Status: DC | PRN

## 2015-02-09 MED ORDER — INSULIN ASPART 100 UNIT/ML ~~LOC~~ SOLN
0.0000 [IU] | Freq: Three times a day (TID) | SUBCUTANEOUS | Status: DC
  Administered 2015-02-10 (×2): 1 [IU] via SUBCUTANEOUS

## 2015-02-09 MED ORDER — CHLORHEXIDINE GLUCONATE 0.12 % MT SOLN
15.0000 mL | Freq: Two times a day (BID) | OROMUCOSAL | Status: DC
  Administered 2015-02-09 – 2015-02-10 (×2): 15 mL via OROMUCOSAL
  Filled 2015-02-09 (×4): qty 15

## 2015-02-09 NOTE — Progress Notes (Signed)
*  Preliminary Results* Bilateral lower extremity venous duplex completed. Bilateral lower extremities are negative for acute deep vein thrombosis, however there is evidence of chronic deep vein thrombosis involving the left femoral and popliteal veins. There is no evidence of Baker's cyst bilaterally.  02/09/2015  Maudry Mayhew, RVT, RDCS, RDMS

## 2015-02-09 NOTE — Progress Notes (Signed)
[facility] Progress Note   Manuel Grant MVV:[id number] DOB: 1946/03/09 DOA: 02/08/2015 PCP: [name], MD  Brief narrative: Manuel Grant is a 69 y.o. male with past medical history of DVT on Xarelto, history of GI bleeding secondary to duodenal ulcer, history of ulcerative colitis (post status of total colectomy), vitamin B12 deficiency, hypertension, hyperlipidemia, GERD chronic kidney disease-stage III, coronary artery disease, diabetes mellitus, who presents with rectal bleeding.   Subjective: Had a bloodly BM this AM- no abdominal pain  Assessment/Plan: Principal Problem:   Rectal bleeding- h/o total colectomy with J pouch (due to ulcerative colitis) - h/o duodenal ulcers and chronic ulcers in J pouch with a h/o chronic slow bleed- management as outpt by Dr Hilarie Fredrickson and has been on iron replacement- has not required transfusions since last March - has had about 4-5 bright red BM in the past 24 hrs which I suspect may be from the above mentioned ulcers in the setting of Xarelto - have held Xarelto- hopefully this will result in resolution of bleed- will follow closely- cont to monitor Hb Q6 hrs - if bleeding does not resolve, will need to ask for GI to evaluate - NPO except water for now  Active Problems: DVT (deep venous thrombosis)- left lower extermity - this was diagnosed back in 3/15 and he was rechecked for it in 7/15- it was noted that the DVT was still present at that time and it was decided by Dr Gwenlyn Found to continue Xarelto for 6 more months at which point lower extremity duplex would be re-check-  - Xarelto now held due to above bleed- will be checking venous duplex to ensure DVT has resolved and hopefully will not have to resume Xarelto    Diabetes mellitus type 2  - cont low dose ISS as he is NPO    Essential hypertension, benign - cont Imdur, Metoprolol   H/o ischemic heart disease s/p cath -last Myoview performed August 06, 2010 was  nonischemic   Code Status: Full code Family Communication:  Disposition Plan: home when GI bleed resolves DVT prophylaxis: SCDs  Consultants:   Procedures:   Antibiotics: Anti-infectives    Start     Dose/Rate Route Frequency Ordered Stop   02/09/15 1000  minocycline (MINOCIN,DYNACIN) capsule 50 mg     50 mg Oral Daily 02/09/15 0755           Objective: Filed Weights   02/08/15 2255  Weight: 97.8 kg (215 lb 9.8 oz)    Intake/Output Summary (Last 24 hours) at 02/09/15 1103 Last data filed at 02/09/15 1011  Gross per 24 hour  Intake 861.67 ml  Output    450 ml  Net 411.67 ml     Vitals Filed Vitals:   02/08/15 2200 02/08/15 2202 02/08/15 2255 02/09/15 0554  BP: 153/111  160/95 129/74  Pulse: 101  91 62  Temp:  98.5 F (36.9 C) 99 F (37.2 C) 98.6 F (37 C)  TempSrc:  Oral Oral Oral  Resp: 22  20 20   Height:   5' 6"  (1.676 m)   Weight:   97.8 kg (215 lb 9.8 oz)   SpO2: 98%  96% 97%    Exam: General: AAO x,3  No acute respiratory distress Lungs: Clear to auscultation bilaterally without wheezes or crackles Cardiovascular: Regular rate and rhythm without murmur gallop or rub normal S1 and S2 Abdomen: Nontender, nondistended, soft, bowel sounds positive, no rebound, no ascites, no appreciable mass Extremities: No significant cyanosis, clubbing, or edema  bilateral lower extremities  Data Reviewed: Basic Metabolic Panel:  Recent Labs Lab 02/08/15 1955 02/09/15 0305  NA 139 140  K 3.5 3.3*  CL 103 105  CO2 26 27  GLUCOSE 213* 233*  BUN 19 16  CREATININE 1.50* 1.27  CALCIUM 9.6 9.5   Liver Function Tests:  Recent Labs Lab 02/08/15 1955  AST 33  ALT 26  ALKPHOS 67  BILITOT 0.5  PROT 7.8  ALBUMIN 4.3   No results for input(s): LIPASE, AMYLASE in the last 168 hours. No results for input(s): AMMONIA in the last 168 hours. CBC:  Recent Labs Lab 02/08/15 1955 02/09/15 0305 02/09/15 0704  WBC 5.1 6.0 6.4  HGB 13.7 13.6 13.6  HCT 43.2  42.9 42.6  MCV 83.4 83.3 82.9  PLT 142* 145* 151   Cardiac Enzymes: No results for input(s): CKTOTAL, CKMB, CKMBINDEX, TROPONINI in the last 168 hours. BNP (last 3 results) No results for input(s): BNP in the last 8760 hours.  ProBNP (last 3 results) No results for input(s): PROBNP in the last 8760 hours.  CBG:  Recent Labs Lab 02/09/15 0727  GLUCAP 124*    No results found for this or any previous visit (from the past 240 hour(s)).   Studies:  Recent x-ray studies have been reviewed in detail by the Attending Physician  Scheduled Meds:  Scheduled Meds: . amLODipine  10 mg Oral Daily  . antiseptic oral rinse  7 mL Mouth Rinse q12n4p  . chlorhexidine  15 mL Mouth Rinse BID  . insulin aspart  0-9 Units Subcutaneous TID WC  . isosorbide mononitrate  60 mg Oral Daily  . metoprolol succinate  75 mg Oral QPM  . minocycline  50 mg Oral Daily  . pantoprazole (PROTONIX) IV  40 mg Intravenous Q12H  . potassium chloride  40 mEq Oral Q4H  . pravastatin  10 mg Oral q1800  . traMADol  100 mg Oral TID   Continuous Infusions: . sodium chloride 100 mL/hr (02/09/15 1011)    Time spent on care of this patient: 40 min   [place], MD 02/09/2015, 11:03 AM  LOS: 1 day   [facility] Office  [phone] Pager - Text Page per [email or web address]  If 7PM-7AM, please contact night-coverage [email or web address]

## 2015-02-10 DIAGNOSIS — I82409 Acute embolism and thrombosis of unspecified deep veins of unspecified lower extremity: Secondary | ICD-10-CM

## 2015-02-10 LAB — CBC
HCT: 39 % (ref 39.0–52.0)
HEMATOCRIT: 41.9 % (ref 39.0–52.0)
HEMOGLOBIN: 12.2 g/dL — AB (ref 13.0–17.0)
Hemoglobin: 13 g/dL (ref 13.0–17.0)
MCH: 26.4 pg (ref 26.0–34.0)
MCH: 26.3 pg (ref 26.0–34.0)
MCHC: 31.3 g/dL (ref 30.0–36.0)
MCHC: 31 g/dL (ref 30.0–36.0)
MCV: 84.4 fL (ref 78.0–100.0)
MCV: 84.6 fL (ref 78.0–100.0)
Platelets: 123 10*3/uL — ABNORMAL LOW (ref 150–400)
Platelets: 133 10*3/uL — ABNORMAL LOW (ref 150–400)
RBC: 4.62 MIL/uL (ref 4.22–5.81)
RBC: 4.95 MIL/uL (ref 4.22–5.81)
WBC: 5.7 10*3/uL (ref 4.0–10.5)
WBC: 6.1 10*3/uL (ref 4.0–10.5)

## 2015-02-10 LAB — CREATININE, URINE, RANDOM: CREATININE, URINE: 162.4 mg/dL

## 2015-02-10 LAB — GLUCOSE, CAPILLARY
GLUCOSE-CAPILLARY: 138 mg/dL — AB (ref 70–99)
GLUCOSE-CAPILLARY: 156 mg/dL — AB (ref 70–99)
Glucose-Capillary: 99 mg/dL (ref 70–99)
Glucose-Capillary: 146 mg/dL — ABNORMAL HIGH (ref 70–99)

## 2015-02-10 LAB — UREA NITROGEN, URINE: Urea Nitrogen, Ur: 726 mg/dL

## 2015-02-10 MED ORDER — GLIPIZIDE ER 5 MG PO TB24: 5.0000 mg | ORAL_TABLET | Freq: Every day | ORAL | Status: DC

## 2015-02-10 NOTE — Progress Notes (Signed)
[facility] Progress Note   Manuel Grant PHX:[id number] DOB: 27-Jul-1946 DOA: 02/08/2015 PCP: [name], MD  Brief narrative: Manuel Grant is a 69 y.o. male with past medical history of DVT on Xarelto, history of GI bleeding secondary to duodenal ulcer, history of ulcerative colitis (post status of total colectomy), vitamin B12 deficiency, hypertension, hyperlipidemia, GERD chronic kidney disease-stage III, coronary artery disease, diabetes mellitus, who presents with rectal bleeding.   Subjective: Last bloody BM was > 24 hrs ago- no abdominal pain  Assessment/Plan: Principal Problem:   Rectal bleeding- h/o total colectomy with J pouch (due to ulcerative colitis) - h/o duodenal ulcers and chronic ulcers in J pouch with a h/o chronic slow bleed- management as outpt by Dr Hilarie Fredrickson and has been on iron replacement- has not required transfusions since last March - has had about 4-5 bright red BM in the past 24 hrs which I suspect may be from the above mentioned ulcers in the setting of Xarelto - have held Xarelto- bleeding now resolved- start diet and follow for recurrence   Active Problems: DVT (deep venous thrombosis)- left lower extermity - this was diagnosed back in 3/15 and he was rechecked for it in 7/15- it was noted that the DVT was still present at that time and it was decided by Dr Gwenlyn Found to continue Xarelto for 6 more months at which point lower extremity duplex would be re-check-  - Xarelto now held due to above bleed- - venous duplex negative for acute DVT - per report, he has a chronic CVT in the left leg- will no be resuming Xarelto at this point.     Diabetes mellitus type 2  -cont ISS    Essential hypertension, benign - cont Imdur, Metoprolol   H/o ischemic heart disease s/p cath -last Myoview performed August 06, 2010 was nonischemic   Code Status: Full code Family Communication:  Disposition Plan: home when GI bleed resolves DVT prophylaxis:  SCDs  Consultants:   Procedures:   Antibiotics: Anti-infectives    Start     Dose/Rate Route Frequency Ordered Stop   02/09/15 1000  minocycline (MINOCIN,DYNACIN) capsule 50 mg     50 mg Oral Daily 02/09/15 0755           Objective: Filed Weights   02/08/15 2255  Weight: 97.8 kg (215 lb 9.8 oz)    Intake/Output Summary (Last 24 hours) at 02/10/15 1313 Last data filed at 02/10/15 0950  Gross per 24 hour  Intake 2341.67 ml  Output   1650 ml  Net 691.67 ml     Vitals Filed Vitals:   02/09/15 1438 02/09/15 2222 02/10/15 0630 02/10/15 0908  BP: 146/100 160/94 155/82 158/96  Pulse: 67 57 68   Temp: 98.2 F (36.8 C) 97.9 F (36.6 C) 98 F (36.7 C)   TempSrc: Oral Oral Oral   Resp: 18 18 18    Height:      Weight:      SpO2: 100% 98% 100%     Exam: General: AAO x,3  No acute respiratory distress Lungs: Clear to auscultation bilaterally without wheezes or crackles Cardiovascular: Regular rate and rhythm without murmur gallop or rub normal S1 and S2 Abdomen: Nontender, nondistended, soft, bowel sounds positive, no rebound, no ascites, no appreciable mass Extremities: No significant cyanosis, clubbing, or edema bilateral lower extremities  Data Reviewed: Basic Metabolic Panel:  Recent Labs Lab 02/08/15 1955 02/09/15 0305  NA 139 140  K 3.5 3.3*  CL 103 105  CO2  26 27  GLUCOSE 213* 233*  BUN 19 16  CREATININE 1.50* 1.27  CALCIUM 9.6 9.5   Liver Function Tests:  Recent Labs Lab 02/08/15 1955  AST 33  ALT 26  ALKPHOS 67  BILITOT 0.5  PROT 7.8  ALBUMIN 4.3   No results for input(s): LIPASE, AMYLASE in the last 168 hours. No results for input(s): AMMONIA in the last 168 hours. CBC:  Recent Labs Lab 02/09/15 0704 02/09/15 1315 02/09/15 1912 02/10/15 0112 02/10/15 0702  WBC 6.4 5.9 5.1 5.7 6.1  HGB 13.6 13.4 12.8* 12.2* 13.0  HCT 42.6 42.2 41.0 39.0 41.9  MCV 82.9 83.1 84.4 84.4 84.6  PLT 151 143* 133* 123* 133*   Cardiac Enzymes: No  results for input(s): CKTOTAL, CKMB, CKMBINDEX, TROPONINI in the last 168 hours. BNP (last 3 results) No results for input(s): BNP in the last 8760 hours.  ProBNP (last 3 results) No results for input(s): PROBNP in the last 8760 hours.  CBG:  Recent Labs Lab 02/09/15 1254 02/09/15 1649 02/09/15 2216 02/10/15 0739 02/10/15 1137  GLUCAP 132* 93 88 99 146*    No results found for this or any previous visit (from the past 240 hour(s)).   Studies:  Recent x-ray studies have been reviewed in detail by the Attending Physician  Scheduled Meds:  Scheduled Meds: . amLODipine  10 mg Oral Daily  . antiseptic oral rinse  7 mL Mouth Rinse q12n4p  . chlorhexidine  15 mL Mouth Rinse BID  . insulin aspart  0-9 Units Subcutaneous TID WC  . isosorbide mononitrate  60 mg Oral Daily  . metoprolol succinate  75 mg Oral QPM  . minocycline  50 mg Oral Daily  . pantoprazole (PROTONIX) IV  40 mg Intravenous Q12H  . pravastatin  10 mg Oral q1800  . traMADol  100 mg Oral TID   Continuous Infusions: . sodium chloride 100 mL/hr at 02/10/15 1132    Time spent on care of this patient: 40 min   [place], MD 02/10/2015, 1:13 PM  LOS: 2 days   [facility] Office  [phone] Pager - Text Page per [email or web address]  If 7PM-7AM, please contact night-coverage [email or web address]

## 2015-02-11 DIAGNOSIS — D62 Acute posthemorrhagic anemia: Secondary | ICD-10-CM

## 2015-02-11 LAB — CBC
HEMATOCRIT: 38.2 % — AB (ref 39.0–52.0)
Hemoglobin: 11.9 g/dL — ABNORMAL LOW (ref 13.0–17.0)
MCH: 26 pg (ref 26.0–34.0)
MCHC: 31.2 g/dL (ref 30.0–36.0)
MCV: 83.4 fL (ref 78.0–100.0)
Platelets: 118 10*3/uL — ABNORMAL LOW (ref 150–400)
RBC: 4.58 MIL/uL (ref 4.22–5.81)
WBC: 4.3 10*3/uL (ref 4.0–10.5)

## 2015-02-11 LAB — GLUCOSE, CAPILLARY: GLUCOSE-CAPILLARY: 120 mg/dL — AB (ref 70–99)

## 2015-02-11 NOTE — Progress Notes (Signed)
Nurse reviewed discharge instructions with pt.  Pt verbalized understanding of discharge instructions and follow up appointments.  No concerns at time of discharge.

## 2015-02-11 NOTE — Discharge Summary (Addendum)
Physician Discharge Summary  Manuel Grant KDT:[id number] DOB: 25-Oct-1946 DOA: 02/08/2015  PCP: [name], MD  Admit date: 02/08/2015 Discharge date: 02/11/2015  Time spent: 50 minutes  Recommendations for Outpatient Follow-up:  1. Hb check in 1 month  Discharge Condition: stable Diet recommendation: diabetic, heart healthy  Discharge Diagnoses:  Principal Problem:   Rectal bleeding- acute blood loss anemia Active Problems:   Diabetes mellitus type 2 with neurological manifestations   Essential hypertension, benign   Pure hypercholesterolemia   H/O colectomy   CAD (coronary artery disease)   Osteoarthritis   Duodenal ulcer disease   DVT (deep venous thrombosis)    History of present illness:  Manuel Grant is a 69 y.o. male with past medical history of DVT on Xarelto, history of GI bleeding secondary to duodenal ulcer, history of ulcerative colitis (post status of total colectomy), vitamin B12 deficiency, hypertension, hyperlipidemia, GERD chronic kidney disease-stage III, coronary artery disease, diabetes mellitus, who presents with rectal bleeding.  Hospital Course:  Principal Problem:  Rectal bleeding- h/o total colectomy with J pouch (due to ulcerative colitis) - h/o duodenal ulcers and chronic ulcers in J pouch with a h/o chronic slow bleed- management as outpt by Dr Hilarie Fredrickson and has been on iron replacement- has not required transfusions since last March -  had about 4-5 bright red BM in a 24 hr period which I suspect may be from the above mentioned ulcers in the setting of Xarelto - have held Xarelto- bleeding now resolved- advanced to solid diet yesterday- no further recurrence of bleeding- patient stable for d/c home  Active Problems: DVT (deep venous thrombosis)- left lower extermity - this was diagnosed back in 3/15 and he was rechecked for it in 7/15- it was noted that the DVT was still present at that time and it was decided by Dr Gwenlyn Found to continue  Xarelto for 6 more months at which point lower extremity duplex would be re-check-  - Xarelto now held due to above bleed- - venous duplex negative for acute DVT - per report, he has a chronic DVT in the left leg- will not be resuming Xarelto at this point- he is advised he is at risk for a recurrence in the same leg and should be vigilant   Acute blood loss anemia - Hb dropped from 13.6 to 11.9 (checked this AM)- he is asymptomatic - recommended to continue outpt dose of Iron and have Hb checked in a month   Diabetes mellitus type 2  -cont home meds   Essential hypertension, benign - cont Imdur, Metoprolol   H/o ischemic heart disease s/p cath -last Myoview performed August 06, 2010 was nonischemic   Discharge Exam: Danley Danker Weights   02/08/15 2255  Weight: 97.8 kg (215 lb 9.8 oz)   Filed Vitals:   02/11/15 0635  BP: 158/94  Pulse: 65  Temp: 97.9 F (36.6 C)  Resp: 18    General: AAO x 3, no distress Cardiovascular: RRR, no murmurs  Respiratory: clear to auscultation bilaterally GI: soft, non-tender, non-distended, bowel sound positive  Discharge Instructions You were cared for by a hospitalist during your hospital stay. If you have any questions about your discharge medications or the care you received while you were in the hospital after you are discharged, you can call the unit and asked to speak with the hospitalist on call if the hospitalist that took care of you is not available. Once you are discharged, your primary care physician will handle any further medical  issues. Please note that NO REFILLS for any discharge medications will be authorized once you are discharged, as it is imperative that you return to your primary care physician (or establish a relationship with a primary care physician if you do not have one) for your aftercare needs so that they can reassess your need for medications and monitor your lab values.  Discharge Instructions    Diet - low sodium  heart healthy    Complete by:  As directed      Increase activity slowly    Complete by:  As directed             Medication List    STOP taking these medications        XARELTO 20 MG Tabs tablet  Generic drug:  rivaroxaban      TAKE these medications        cyanocobalamin 1000 MCG/ML injection  Commonly known as:  (VITAMIN B-12)  INJECT 1ML INTO THE MUSCLE EVERY 30 DAYS     glipiZIDE 10 MG 24 hr tablet  Commonly known as:  GLUCOTROL XL  TAKE 1 TABLET BY MOUTH ONCE A DAY     isosorbide mononitrate 60 MG 24 hr tablet  Commonly known as:  IMDUR  Take 1 tablet (60 mg total) by mouth daily.     lisinopril-hydrochlorothiazide 20-12.5 MG per tablet  Commonly known as:  PRINZIDE,ZESTORETIC  Take 1 tablet by mouth daily.     lovastatin 10 MG tablet  Commonly known as:  MEVACOR  Take 1 tablet (10 mg total) by mouth at bedtime.     metFORMIN 1000 MG tablet  Commonly known as:  GLUCOPHAGE  Take 1 tablet (1,000 mg total) by mouth 2 (two) times daily.     metoprolol succinate 50 MG 24 hr tablet  Commonly known as:  TOPROL-XL  Take 1 1/2 tablets daily. Take with or immediately following a meal.     minocycline 50 MG capsule  Commonly known as:  MINOCIN,DYNACIN  Take 1 capsule by mouth daily.     pantoprazole 40 MG tablet  Commonly known as:  PROTONIX  Take 1 tablet (40 mg total) by mouth daily.     traMADol 50 MG tablet  Commonly known as:  ULTRAM  Take 100 mg by mouth 3 (three) times daily.     zolpidem 10 MG tablet  Commonly known as:  AMBIEN  TAKE 1 TABLET BY MOUTH EVERY NIGHT AT BEDTIME AS NEEDED FOR SLEEP       No Known Allergies     Follow-up Information    Follow up with [name], MD.   Specialty:  Family Medicine   Why:  As needed   Contact information:   [place] [address] [phone]       Follow up with Jerene Bears, MD. Schedule an appointment as soon as possible for a visit in 2 weeks.   Specialty:   Gastroenterology   Contact information:   [address] [place] [address] [phone]        The results of significant diagnostics from this hospitalization (including imaging, microbiology, ancillary and laboratory) are listed below for reference.    Significant Diagnostic Studies: No results found.  Microbiology: No results found for this or any previous visit (from the past 240 hour(s)).   Labs: Basic Metabolic Panel:  Recent Labs Lab 02/08/15 1955 02/09/15 0305  NA 139 140  K 3.5 3.3*  CL 103 105  CO2 26 27  GLUCOSE 213* 233*  BUN 19 16  CREATININE 1.50* 1.27  CALCIUM 9.6 9.5   Liver Function Tests:  Recent Labs Lab 02/08/15 1955  AST 33  ALT 26  ALKPHOS 67  BILITOT 0.5  PROT 7.8  ALBUMIN 4.3   No results for input(s): LIPASE, AMYLASE in the last 168 hours. No results for input(s): AMMONIA in the last 168 hours. CBC:  Recent Labs Lab 02/09/15 1315 02/09/15 1912 02/10/15 0112 02/10/15 0702 02/11/15 0540  WBC 5.9 5.1 5.7 6.1 4.3  HGB 13.4 12.8* 12.2* 13.0 11.9*  HCT 42.2 41.0 39.0 41.9 38.2*  MCV 83.1 84.4 84.4 84.6 83.4  PLT 143* 133* 123* 133* 118*   Cardiac Enzymes: No results for input(s): CKTOTAL, CKMB, CKMBINDEX, TROPONINI in the last 168 hours. BNP: BNP (last 3 results) No results for input(s): BNP in the last 8760 hours.  ProBNP (last 3 results) No results for input(s): PROBNP in the last 8760 hours.  CBG:  Recent Labs Lab 02/10/15 0739 02/10/15 1137 02/10/15 1757 02/10/15 2226 02/11/15 0746  GLUCAP 99 146* 138* 156* 120*       SignedDebbe Odea, MD [facility] 02/11/2015, 11:18 AM

## 2015-02-20 ENCOUNTER — Encounter: Payer: Self-pay | Admitting: *Deleted

## 2015-03-06 ENCOUNTER — Telehealth: Payer: Self-pay | Admitting: Internal Medicine

## 2015-03-06 NOTE — Telephone Encounter (Signed)
Pt to have labs prior to OV, orders in epic and pt aware.

## 2015-03-08 ENCOUNTER — Other Ambulatory Visit (INDEPENDENT_AMBULATORY_CARE_PROVIDER_SITE_OTHER): Payer: PPO

## 2015-03-08 DIAGNOSIS — D509 Iron deficiency anemia, unspecified: Secondary | ICD-10-CM | POA: Diagnosis not present

## 2015-03-08 LAB — CBC WITH DIFFERENTIAL/PLATELET
Basophils Absolute: 0 10*3/uL (ref 0.0–0.1)
Basophils Relative: 0.5 % (ref 0.0–3.0)
EOS PCT: 2.1 % (ref 0.0–5.0)
Eosinophils Absolute: 0.1 10*3/uL (ref 0.0–0.7)
HCT: 41.2 % (ref 39.0–52.0)
HEMOGLOBIN: 13.7 g/dL (ref 13.0–17.0)
Lymphocytes Relative: 34.5 % (ref 12.0–46.0)
Lymphs Abs: 1.8 10*3/uL (ref 0.7–4.0)
MCHC: 33.2 g/dL (ref 30.0–36.0)
MCV: 83.9 fl (ref 78.0–100.0)
Monocytes Absolute: 0.6 10*3/uL (ref 0.1–1.0)
Monocytes Relative: 11.9 % (ref 3.0–12.0)
Neutro Abs: 2.7 10*3/uL (ref 1.4–7.7)
Neutrophils Relative %: 51 % (ref 43.0–77.0)
Platelets: 178 10*3/uL (ref 150.0–400.0)
RBC: 4.92 Mil/uL (ref 4.22–5.81)
RDW: 19.7 % — ABNORMAL HIGH (ref 11.5–15.5)
WBC: 5.2 10*3/uL (ref 4.0–10.5)

## 2015-03-08 LAB — IBC PANEL
Iron: 47 ug/dL (ref 42–165)
Saturation Ratios: 11.9 % — ABNORMAL LOW (ref 20.0–50.0)
TRANSFERRIN: 281 mg/dL (ref 212.0–360.0)

## 2015-03-08 LAB — FERRITIN: Ferritin: 46.4 ng/mL (ref 22.0–322.0)

## 2015-03-11 ENCOUNTER — Encounter: Payer: Self-pay | Admitting: Internal Medicine

## 2015-03-11 ENCOUNTER — Ambulatory Visit (INDEPENDENT_AMBULATORY_CARE_PROVIDER_SITE_OTHER): Payer: PPO | Admitting: Internal Medicine

## 2015-03-11 ENCOUNTER — Other Ambulatory Visit: Payer: Self-pay

## 2015-03-11 VITALS — BP 124/80 | HR 72 | Ht 65.75 in | Wt 220.1 lb

## 2015-03-11 DIAGNOSIS — I825Z2 Chronic embolism and thrombosis of unspecified deep veins of left distal lower extremity: Secondary | ICD-10-CM

## 2015-03-11 DIAGNOSIS — K625 Hemorrhage of anus and rectum: Secondary | ICD-10-CM

## 2015-03-11 DIAGNOSIS — Z7901 Long term (current) use of anticoagulants: Secondary | ICD-10-CM

## 2015-03-11 DIAGNOSIS — D509 Iron deficiency anemia, unspecified: Secondary | ICD-10-CM

## 2015-03-11 DIAGNOSIS — K91858 Other complications of intestinal pouch: Secondary | ICD-10-CM

## 2015-03-11 NOTE — Patient Instructions (Signed)
Continue iron 325 mg three times daily.  Your physician has requested that you go to the basement for the following lab work 05/29/15: CBC, IBC  Please follow up with Dr Hilarie Fredrickson in June or July 2016.

## 2015-03-13 ENCOUNTER — Telehealth: Payer: Self-pay | Admitting: *Deleted

## 2015-03-13 NOTE — Telephone Encounter (Signed)
I have spoken to patient to advise that per Dr Gwenlyn Found and Dr Vena Rua conversation, he is to discontinue his xarelto at this time. He should follow up with Dr Hilarie Fredrickson in June (I will contact him to schedule appt as we have no schedule out for June at this time). He should also schedule follow up with Dr Gwenlyn Found. Patient verbalizes understanding.

## 2015-03-13 NOTE — Progress Notes (Signed)
Subjective:    Patient ID: Manuel Grant, male    DOB: [birth year], 69 y.o.   MRN: [id number]  HPI Manuel Grant is a 69 yo male with PMH of ulcerative colitis status post subtotal colectomy with J-pouch performed at [facility] in 2005, chronic anastomotic ulcer of the J-pouch, history of peptic ulcer disease, iron deficiency anemia, left lower extremity DVT previously on Xarelto, diabetes, hypertension, hyperlipidemia who is seen for follow-up. He is here today with his wife. He was recently in the hospital with recurrent rectal bleeding. His was in the setting of Xarelto, the Xarelto was held and the bleeding abated. His hemoglobin did decline but he did not require transfusion. The bleeding was again painless. He reports repeat ultrasound of the left lower extremity showed chronic clot but no active or acute thrombosis.   Today he feels well. No further bleeding. Bowel movements are as usual for him, 810 times a day and loose. He is taking iron which darkens his stools. He stopped the iron for a few days just to be sure he wasn't seeing melena in his stools returned to a brown color. He is curious as to whether to resume Xarelto and wants input from Dr. Gwenlyn Found  Review of Systems As per HPI, otherwise negative  Current Medications, Allergies, Past Medical History, Past Surgical History, Family History and Social History were reviewed in [facility] record.     Objective:   Physical Exam BP 124/80 mmHg  Pulse 72  Ht 5' 5.75" (1.67 m)  Wt 220 lb 2 oz (99.848 kg)  BMI 35.80 kg/m2 Constitutional: Well-developed and well-nourished. No distress. HEENT: Normocephalic and atraumatic.  Conjunctivae are normal.  No scleral icterus. Neck: Neck supple. Trachea midline. Cardiovascular: Normal rate, regular rhythm and intact distal pulses. No M/R/G Pulmonary/chest: Effort normal and breath sounds normal. No wheezing, rales or rhonchi. Abdominal: Soft, nontender, nondistended.  Bowel sounds active throughout. There are no masses palpable. No hepatosplenomegaly. Extremities: no clubbing, cyanosis, or edema Lymphadenopathy: No cervical adenopathy noted. Neurological: Alert and oriented to person place and time. Skin: Skin is warm and dry. No rashes noted. Psychiatric: Normal mood and affect. Behavior is normal.  CBC    Component Value Date/Time   WBC 5.2 03/08/2015 1627   WBC 4.5 05/24/2014 0817   RBC 4.92 03/08/2015 1627   RBC 4.60 05/24/2014 0817   HGB 13.7 03/08/2015 1627   HGB 11.6* 05/24/2014 0817   HCT 41.2 03/08/2015 1627   HCT 37.3* 05/24/2014 0817   PLT 178.0 03/08/2015 1627   PLT 152 05/24/2014 0817   MCV 83.9 03/08/2015 1627   MCV 81.1 05/24/2014 0817   MCH 26.0 02/11/2015 0540   MCH 25.2* 05/24/2014 0817   MCHC 33.2 03/08/2015 1627   MCHC 31.1* 05/24/2014 0817   RDW 19.7* 03/08/2015 1627   RDW 26.3* 05/24/2014 0817   LYMPHSABS 1.8 03/08/2015 1627   LYMPHSABS 1.5 05/24/2014 0817   MONOABS 0.6 03/08/2015 1627   MONOABS 0.5 05/24/2014 0817   EOSABS 0.1 03/08/2015 1627   EOSABS 0.2 05/24/2014 0817   BASOSABS 0.0 03/08/2015 1627   BASOSABS 0.0 05/24/2014 0817    Iron/TIBC/Ferritin/ %Sat    Component Value Date/Time   IRON 47 03/08/2015 1627   IRON 49 04/30/2014 1543   TIBC 274 04/30/2014 1543   TIBC 405 03/15/2014 1600   FERRITIN 46.4 03/08/2015 1627   FERRITIN 206 04/30/2014 1543   IRONPCTSAT 11.9* 03/08/2015 1627   IRONPCTSAT 18* 04/30/2014 1543   Repeat  LLE doppler performed during recent hospitalization:  Bilateral lower extremity venous duplex completed. Bilateral lower extremities are negative for acute deep vein thrombosis, however there is evidence of chronic deep vein thrombosis involving the left femoral and popliteal veins. There is no evidence of Baker's cyst bilaterally.  02/09/2015  Maudry Mayhew, RVT, RDCS, RDMS      Assessment & Plan:   69 yo male with PMH of ulcerative colitis status post subtotal  colectomy with J-pouch performed at [facility] in 2005, chronic anastomotic ulcer of the J-pouch, history of peptic ulcer disease, iron deficiency anemia, left lower extremity DVT previously on Xarelto, diabetes, hypertension, hyperlipidemia who is seen for follow-up.  1. Chronic anastomotic ulcer of J-pouch/recent bleeding in the setting of Xarelto/iron deficiency anemia -- recurrent hospitalization for bright red blood per rectum felt secondary to his chronic ulcer bleeding. Xarelto was held and he's had no further bleeding. Repeat Doppler of the left lower extremity showed chronic thrombosis without acute thrombosis. I called and spoke personally to Dr. Gwenlyn Found. We discussed the situation and his recommendation would be to stop Xarelto. We will communicate this with the patient. I asked that he continue oral iron 325 mg 3 times daily and have repeat CBC and iron studies in June. Hemoglobin has returned to normal after hospitalization but iron studies remain borderline low. Follow-up with me in June  2. History of duodenal ulcer -- and tinea daily PPI and avoid NSAIDs  3. History of DVT -- discussed with Dr. Gwenlyn Found. We'll have patient discontinue Xarelto at this time. He will follow-up with Dr. Gwenlyn Found as scheduled.

## 2015-03-13 NOTE — Telephone Encounter (Signed)
-----   Message -----    From: Jerene Bears, MD    Sent: 03/13/2015   1:12 PM      To: Larina Bras, CMA  Dottie Please call the patient. Let him know I spoke to Dr. Gwenlyn Found who recommended discontinuing Xarelto at this time. Patient to follow-up with me in June and Dr. Gwenlyn Found as scheduled

## 2015-03-22 ENCOUNTER — Telehealth: Payer: Self-pay | Admitting: Cardiovascular Disease

## 2015-03-22 NOTE — Telephone Encounter (Signed)
Pt called in stating that he would like to come in and be seen sooner that 5/18 because he is having some swelling in his legs and he is now no longer taking Xarelto. Please f/u with pt  Thanks

## 2015-03-22 NOTE — Telephone Encounter (Signed)
Pt hospitalized for GI bleed in Feb. Was instructed to d/c Xarelto until further notice & f/u w/ cardiology for further recommendation.   He is c/o incr swelling, pain in left leg. Concerned for claudication. Problem has been persistent for ~1-2 months.   Recent LE bilateral doppler done in Feb.  Advised pt would get Kathryn's recommendation, & that we could try to schedule MLP visit in advance of his appt w/ Dr. Gwenlyn Found May 18th.

## 2015-03-23 NOTE — Telephone Encounter (Signed)
Needs MLP appointment this coming week

## 2015-03-26 ENCOUNTER — Encounter: Payer: Self-pay | Admitting: Cardiology

## 2015-03-26 ENCOUNTER — Ambulatory Visit (INDEPENDENT_AMBULATORY_CARE_PROVIDER_SITE_OTHER): Payer: PPO | Admitting: Cardiology

## 2015-03-26 VITALS — BP 128/80 | HR 71 | Ht 65.5 in | Wt 222.0 lb

## 2015-03-26 DIAGNOSIS — I82402 Acute embolism and thrombosis of unspecified deep veins of left lower extremity: Secondary | ICD-10-CM | POA: Diagnosis not present

## 2015-03-26 DIAGNOSIS — I2510 Atherosclerotic heart disease of native coronary artery without angina pectoris: Secondary | ICD-10-CM

## 2015-03-26 DIAGNOSIS — I10 Essential (primary) hypertension: Secondary | ICD-10-CM

## 2015-03-26 DIAGNOSIS — E7800 Pure hypercholesterolemia, unspecified: Secondary | ICD-10-CM

## 2015-03-26 MED ORDER — ASPIRIN EC 81 MG PO TBEC: 81.0000 mg | DELAYED_RELEASE_TABLET | Freq: Every day | ORAL | Status: DC

## 2015-03-26 NOTE — Assessment & Plan Note (Signed)
Continue present blood pressure medications. 

## 2015-03-26 NOTE — Progress Notes (Signed)
HPI: FU CAD; followed by Dr Gwenlyn Found. He has a history of ischemic heart disease status post cardiac catheterization November 15, 1998 revealing 3-vessel disease with preserved LV function. Medical therapy was recommended at that time. He had a 30% distal left main, a 75% mid LAD, a 50% OM and a total RCA with left-to-right collaterals and normal LV function. His other problems include hypertension and hyperlipidemia. His last Myoview performed August 06, 2010 was nonischemic. Patient had angina last fall. Workup revealed anemia. He was transfused with improvement in his symptoms. EGD revealed alters. Patient also with history of ulcerative colitis and previous colectomy. Also with history of DVT. Patient admitted in February 2016 with GI bleed.Lower extremity Dopplers February 2016 showed chronic DVT involving left femoral vein and left popliteal vein. Xarelto DCed. Patient denies dyspnea, chest pain, palpitations or syncope. Patient was concerned about his previous DVT and wanted further advice. He has noticed occasional minimal pedal edema. Occasional mild left calf pain. The symptoms have been present for over one year. No recent melena or hematochezia.   Current Outpatient Prescriptions  Medication Sig Dispense Refill  . cyanocobalamin (,VITAMIN B-12,) 1000 MCG/ML injection INJECT 1ML INTO THE MUSCLE EVERY 30 DAYS 12 mL 3  . glipiZIDE (GLUCOTROL XL) 10 MG 24 hr tablet TAKE 1 TABLET BY MOUTH ONCE A DAY 30 tablet 5  . isosorbide mononitrate (IMDUR) 60 MG 24 hr tablet Take 1 tablet (60 mg total) by mouth daily. 90 tablet 1  . lisinopril-hydrochlorothiazide (PRINZIDE,ZESTORETIC) 20-12.5 MG per tablet Take 1 tablet by mouth daily. 90 tablet 1  . lovastatin (MEVACOR) 10 MG tablet Take 1 tablet (10 mg total) by mouth at bedtime. 30 tablet 10  . metFORMIN (GLUCOPHAGE) 1000 MG tablet Take 1 tablet (1,000 mg total) by mouth 2 (two) times daily.    . metoprolol succinate (TOPROL-XL) 50 MG 24 hr tablet  Take 1 1/2 tablets daily. Take with or immediately following a meal. (Patient taking differently: Take 75 mg by mouth daily. Take 1 1/2 tablets (75 mg) By Mouth Daily. Take with or immediately following a meal.) 45 tablet 5  . minocycline (MINOCIN,DYNACIN) 50 MG capsule Take 1 capsule by mouth daily.    . pantoprazole (PROTONIX) 40 MG tablet Take 1 tablet (40 mg total) by mouth daily. 30 tablet 4  . tiZANidine (ZANAFLEX) 4 MG tablet Take 4 mg by mouth as needed.     . traMADol (ULTRAM) 50 MG tablet Take 50 mg by mouth 3 (three) times daily. Pt takes 2 tablets, 3 times a day for Rheumatoid Arthritis    . zolpidem (AMBIEN) 10 MG tablet TAKE 1 TABLET BY MOUTH EVERY NIGHT AT BEDTIME AS NEEDED FOR SLEEP 30 tablet 5  . aspirin EC 81 MG tablet Take 1 tablet (81 mg total) by mouth daily. 90 tablet 3   No current facility-administered medications for this visit.     Past Medical History  Diagnosis Date  . Ulcerative colitis     s/p colectomy ~ 2005 at [facility]  . Renal stones     uric acid stones  . Chronic kidney disease   . Hypertension   . Hyperlipidemia   . Rosacea   . Insomnia   . Hiatal hernia   . Arthritis     Knee OA- per [facility]- injected prev by ortho  . Coronary artery disease     a. 10/1998 Cath: LM 30d, LAD 75m OM 50, RCA 100 w/ L->R collats, nl LV ->  Med Rx;  b. 07/2010 MV: no ischemia.  [name] B12 deficiency   . Osteoarthritis   . Type II diabetes mellitus   . Myocardial infarction 1999    "heart occasion" (03/15/2014)  . Pneumonia 1970's  . Anemia   . Deep venous thrombosis     left lower extremity  . GI bleed   . Colon polyps   . Duodenal ulcer     Past Surgical History  Procedure Laterality Date  . Subtotal colectomy  2005    with J pouch creation at [facility]  . Knee arthroscopy Bilateral 1990's  . Hernia repair Right ~ 2006    "abdomen; was as a result of a surgery"  . Cardiac catheterization  11/15/1998    30% distal left main, 75% mid LAD, 50% OM and total RCA  with left to right collateral and normal LV function.  . Esophagogastroduodenoscopy N/A 03/16/2014    Procedure: ESOPHAGOGASTRODUODENOSCOPY (EGD);  Surgeon: Jerene Bears, MD;  Location: [facility] ENDOSCOPY;  Service: Endoscopy;  Laterality: N/A;  . Flexible sigmoidoscopy N/A 03/16/2014    Procedure: FLEXIBLE SIGMOIDOSCOPY;  Surgeon: Jerene Bears, MD;  Location: [facility] ENDOSCOPY;  Service: Endoscopy;  Laterality: N/A;  . US echocardiography  11/16/2007    mild concentric LVH,impaired LV relaxagtion,boderline RV enlargement,nodular thickening of the noncoronary cusup  . Nm myocar perf wall motion  08/06/2010    normal    History   Social History  . Marital Status: Married    Spouse Name: N/A  . Number of Children: 2  . Years of Education: N/A   Occupational History  . retired    Social History Main Topics  . Smoking status: Former Smoker -- 2.00 packs/day for 10 years    Types: Cigarettes    Quit date: 12/28/1974  . Smokeless tobacco: Never Used  . Alcohol Use: 0.0 oz/week    0 Standard drinks or equivalent per week     Comment: 03/15/2014 "6 pack beer or less/yr"  . Drug Use: No  . Sexual Activity: Not Currently   Other Topics Concern  . Not on file   Social History Narrative   Married 1967   Retired from [employer] work   Lives in [place] with his wife.    ROS: no fevers or chills, productive cough, hemoptysis, dysphasia, odynophagia, melena, hematochezia, dysuria, hematuria, rash, seizure activity, orthopnea, PND, pedal edema, claudication. Remaining systems are negative.  Physical Exam: Well-developed well-nourished in no acute distress.  Skin is warm and dry.  HEENT is normal.  Neck is supple.  Chest is clear to auscultation with normal expansion.  Cardiovascular exam is regular rate and rhythm.  Abdominal exam nontender or distended. No masses palpated. Extremities show no edema. neuro grossly intact  ECG normal sinus rhythm at a rate of 71. Limb lead reversal.

## 2015-03-26 NOTE — Assessment & Plan Note (Signed)
Xarelto has been DCed; resume ASA 81 mg daily.

## 2015-03-26 NOTE — Assessment & Plan Note (Signed)
Patient had a previous left lower extremity DVT. This occurred after a hospitalization. He was anticoagulated intermittently for approximately 9 months. This was discontinued during his recent hospitalization because of GI bleed. Note he has had GI bleeding from previous peptic ulcer and also from his previous surgical site for colon resection. Most recent Dopplers showed chronic left lower extremity DVT. His anticoagulation issue was previously discussed with Dr. Gwenlyn Found and was discontinued. There is no signs of recurrence or progression of his DVT. I would not resume anticoagulation at this point. I discussed this in detail with the patient and his wife.

## 2015-03-26 NOTE — Assessment & Plan Note (Signed)
Continue statin. 

## 2015-03-26 NOTE — Patient Instructions (Signed)
Your physician recommends that you schedule a follow-up appointment in: WITH DR BERRY AS SCHEDULED  START ASPIRIN 81 MG ONCE DAILY

## 2015-04-04 NOTE — Telephone Encounter (Signed)
Can this encounter be closed?

## 2015-04-20 IMAGING — CT CT ABD-PELV W/O CM
2 of 4 series · 17 of 46 positions shown, 19 images · non-contrast
Comparison: None.

CLINICAL DATA: Left flank pain.

EXAM:
CT ABDOMEN AND PELVIS WITHOUT CONTRAST
TECHNIQUE: Multidetector CT imaging of the abdomen and pelvis was performed
following the standard protocol without intravenous contrast.

[Series 2: stone study · axial · 0.76mm/px · z∈[-493,-73]mm · 14 of 94 slices shown, 16 images]
[im 5/94  soft-tissue]
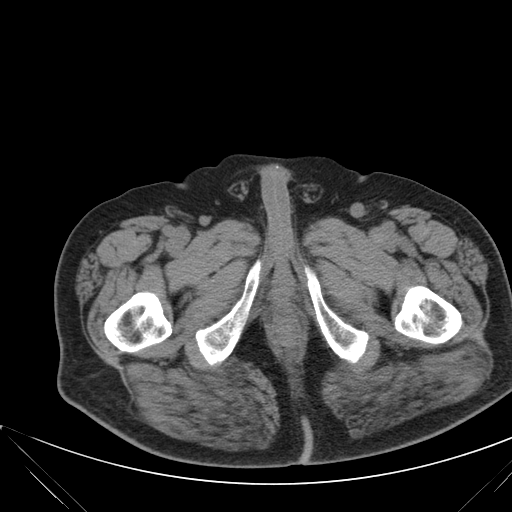
[im 5/94  bone]
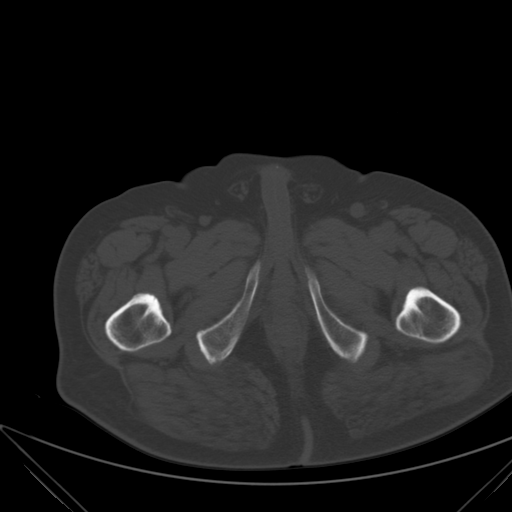
[im 14/94  soft-tissue]
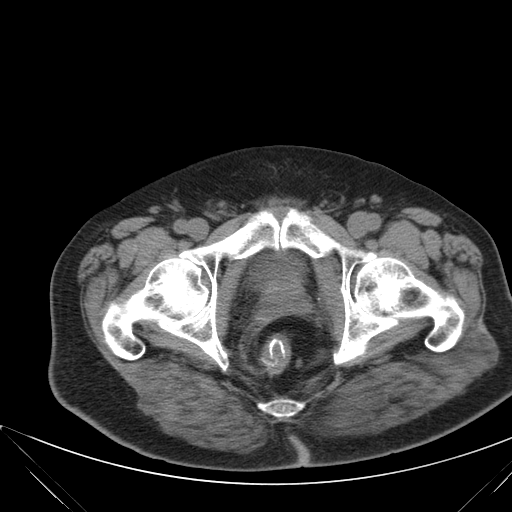
[im 18/94  soft-tissue]
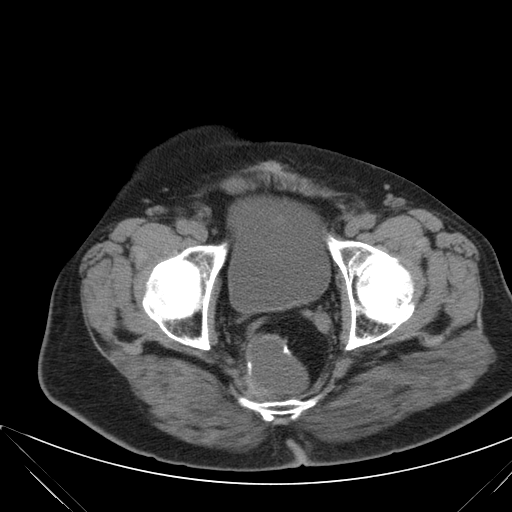
[im 27/94  soft-tissue]
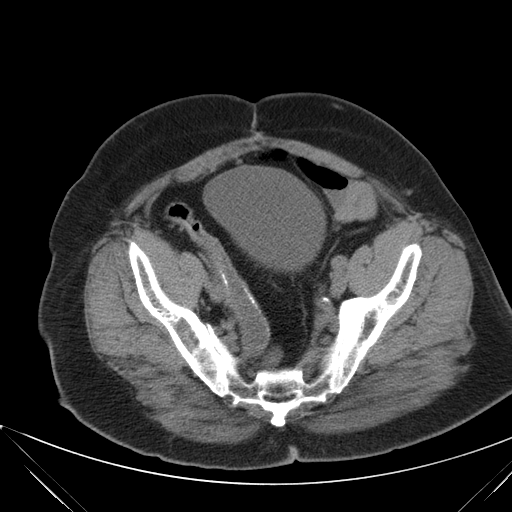
[im 32/94  soft-tissue]
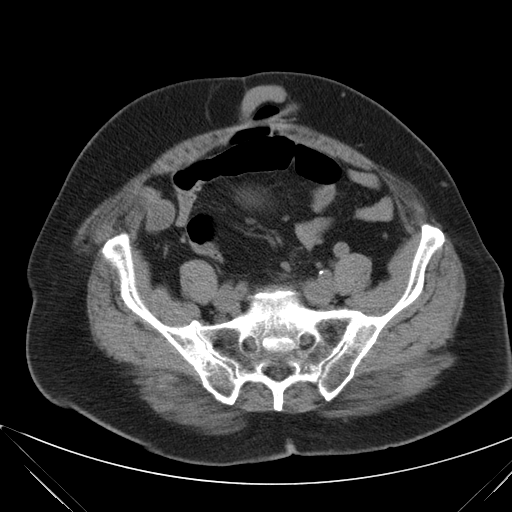
[im 36/94  soft-tissue]
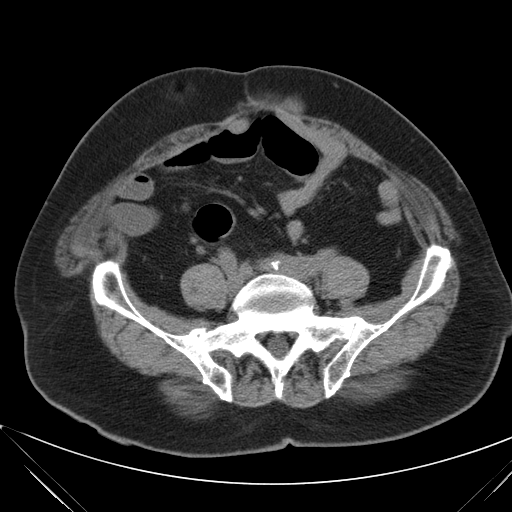
[im 45/94  soft-tissue]
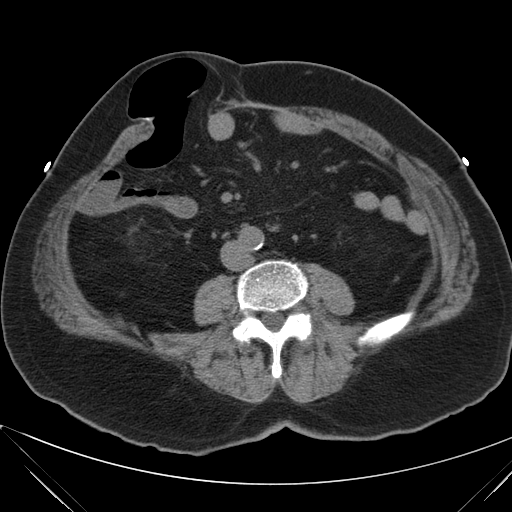
[im 49/94  soft-tissue]
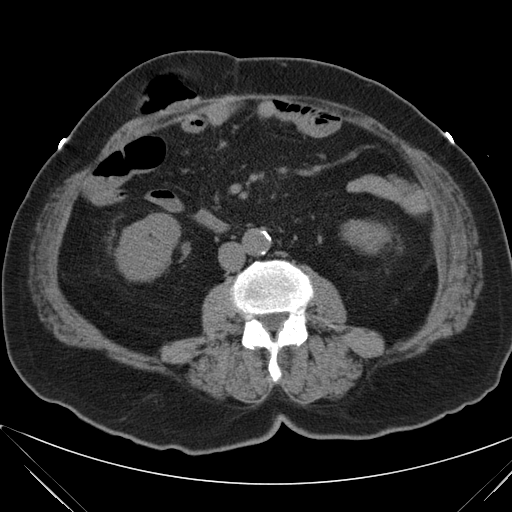
[im 58/94  soft-tissue]
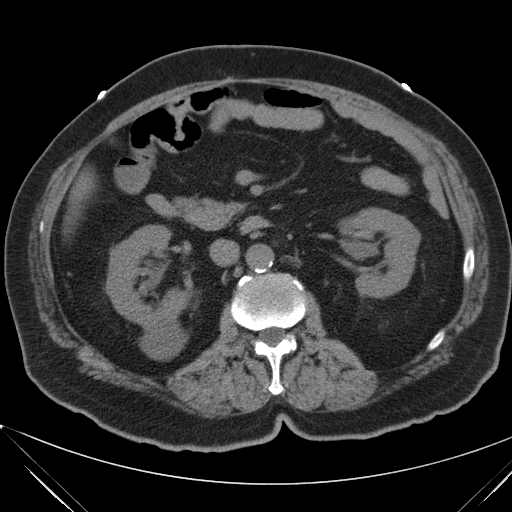
[im 58/94  bone]
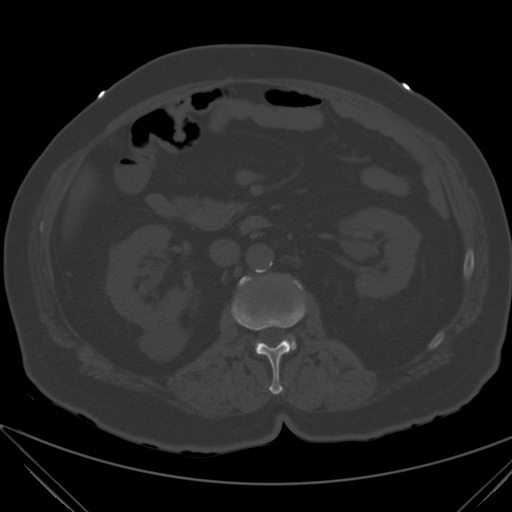
[im 63/94  soft-tissue]
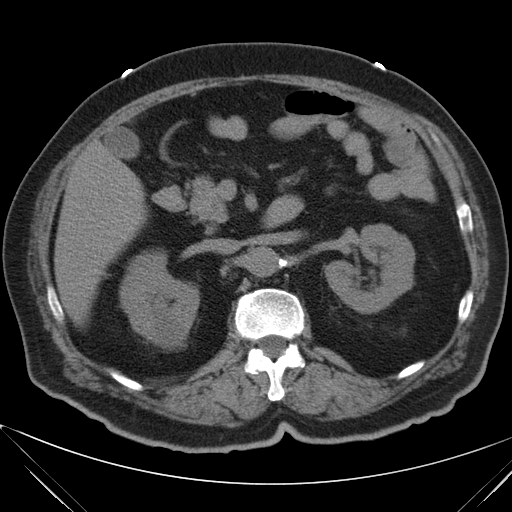
[im 71/94  soft-tissue]
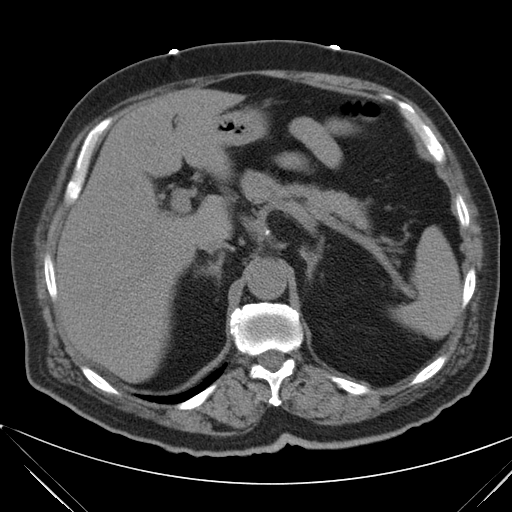
[im 76/94  soft-tissue]
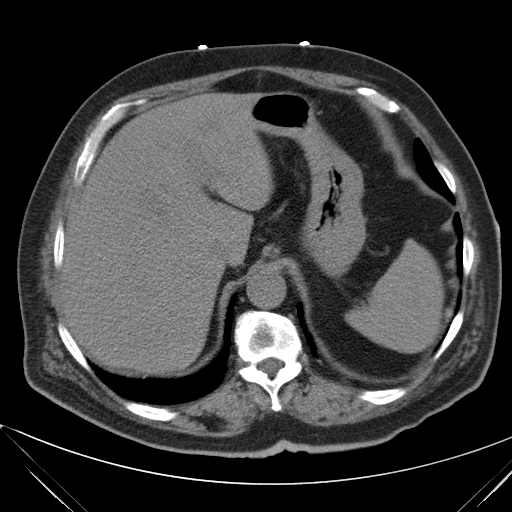
[im 80/94  soft-tissue]
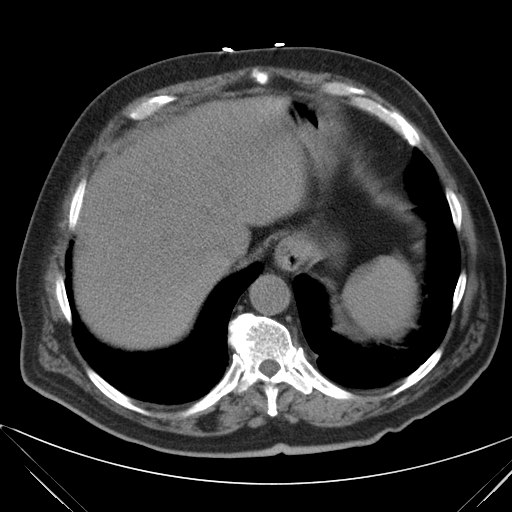
[im 89/94  soft-tissue]
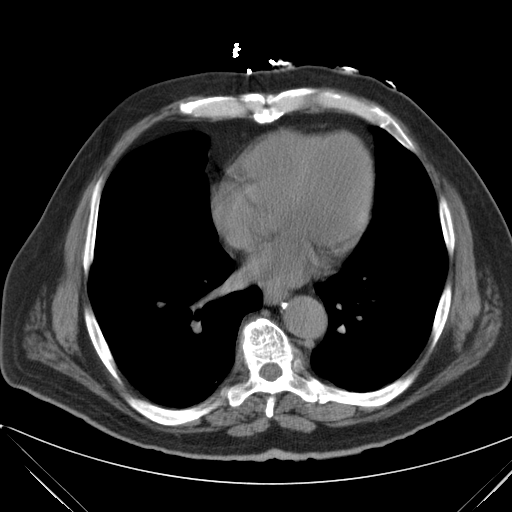

[cor · coronal · 0.91mm/px · 3 of 113 slices shown]
[im 38/113  soft-tissue]
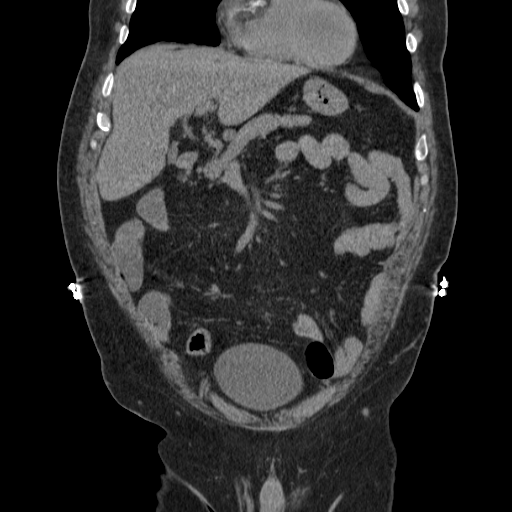
[im 50/113  soft-tissue]
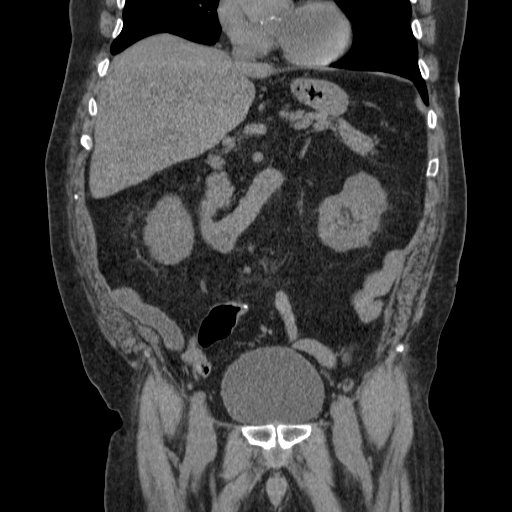
[im 63/113  soft-tissue]
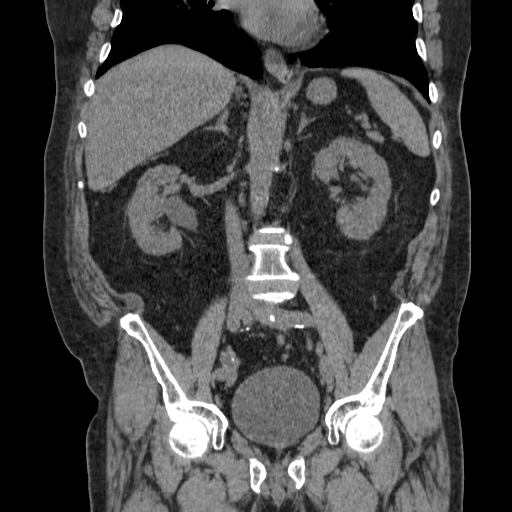

[17 of 46 positions shown; findings below may reference images not displayed]

FINDINGS: BODY WALL: Supra and infraumbilical midline abdominal wall hernias
containing nonobstructed bowel. There is a hernia within the right
lower quadrant which traverses the rectus abdominus, and may be a
remote stomal hernia. This hernia contains small bowel with
enteroenterostomy, nonobstructed.

LOWER CHEST: Multi focal coronary artery atherosclerosis. No
cardiomegaly. No lower lung consolidation.

ABDOMEN/PELVIS:

Liver: No focal abnormality.

Biliary: Cholelithiasis.  No gallbladder distention or inflammation.

Pancreas: Unremarkable.

Spleen: Unremarkable.

Adrenals: Unremarkable.

Kidneys and ureters: Symmetric fullness of the renal collecting
system, without hydronephrosis. Exophytic water density mass from
the interpolar right kidney, 41 mm in diameter, most consistent with
cyst.

Bladder: There is minimal layering high density material within the
right bladder lumen.

Reproductive: Unremarkable.

Bowel: Colectomy, likely with ileal pouch anal anastomosis. No bowel
obstruction. No enteritis.

Retroperitoneum: No mass or adenopathy.

Peritoneum: No free fluid or gas.

Vascular: No acute abnormality.

OSSEOUS: No acute abnormality. Left SI joint ankylosis is likely
degenerative given bony productive change.
IMPRESSION: 1. Layering calcification in the bladder may reflect recently passed
calculi. No hydronephrosis or ureteral calculus.
2. Three abdominal wall hernias containing nonobstructed small
bowel, as above.
3. Cholelithiasis without acute cholecystitis.

## 2015-04-20 IMAGING — CR DG LUMBAR SPINE COMPLETE 4+V
5 series · 5 of 5 positions shown · non-contrast
Comparison: DG CHEST 2 VIEW dated 03/12/2014

CLINICAL DATA: Pain for 2 weeks in the lower back

EXAM:
LUMBAR SPINE - COMPLETE 4+ VIEW

[view not recorded (1 of 5)]
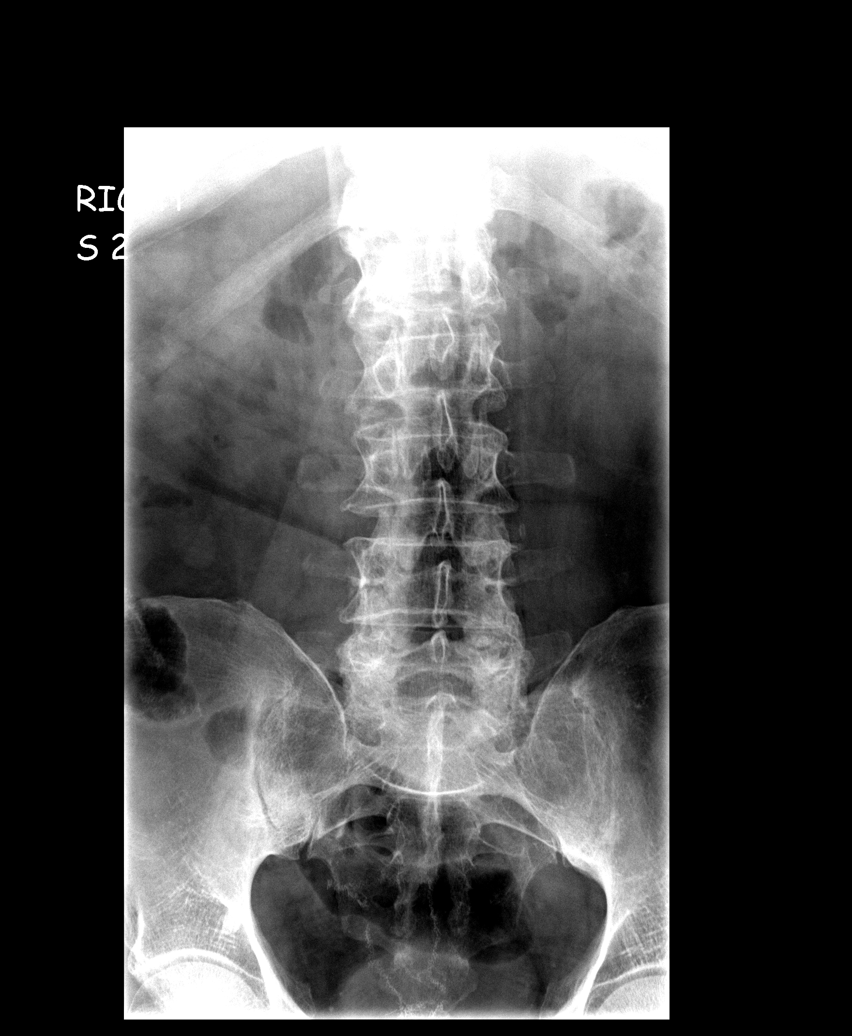

[view not recorded (2 of 5)]
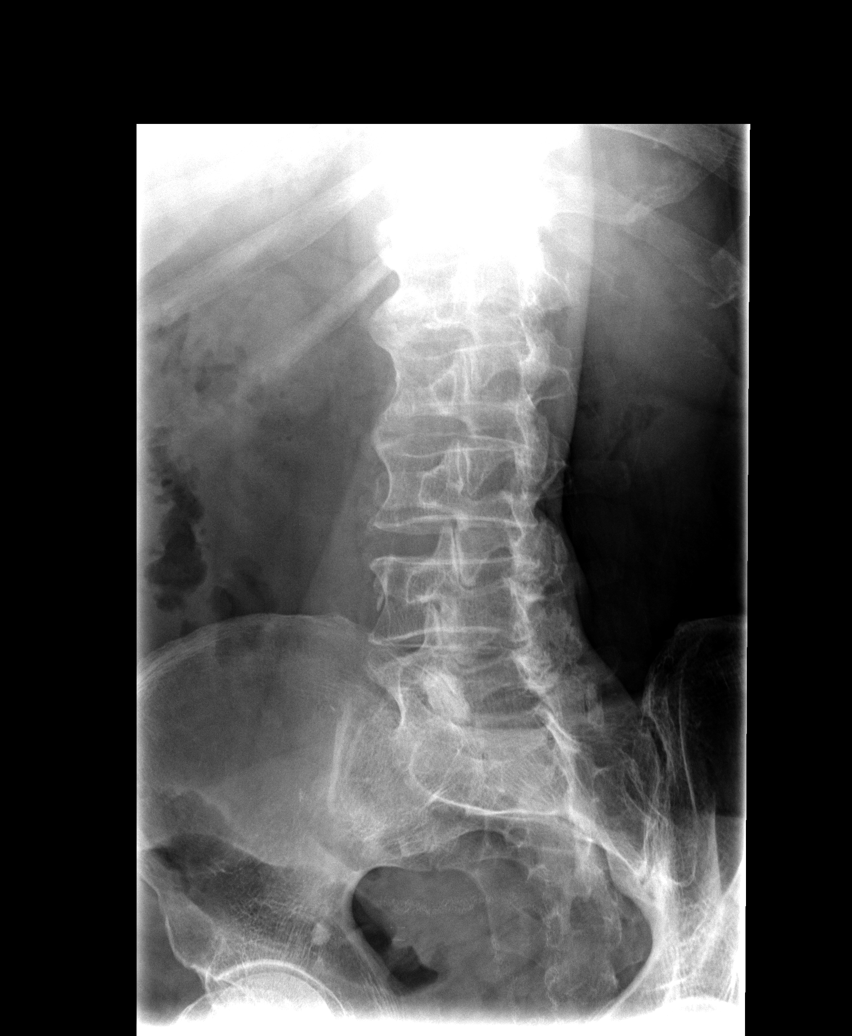

[view not recorded (3 of 5)]
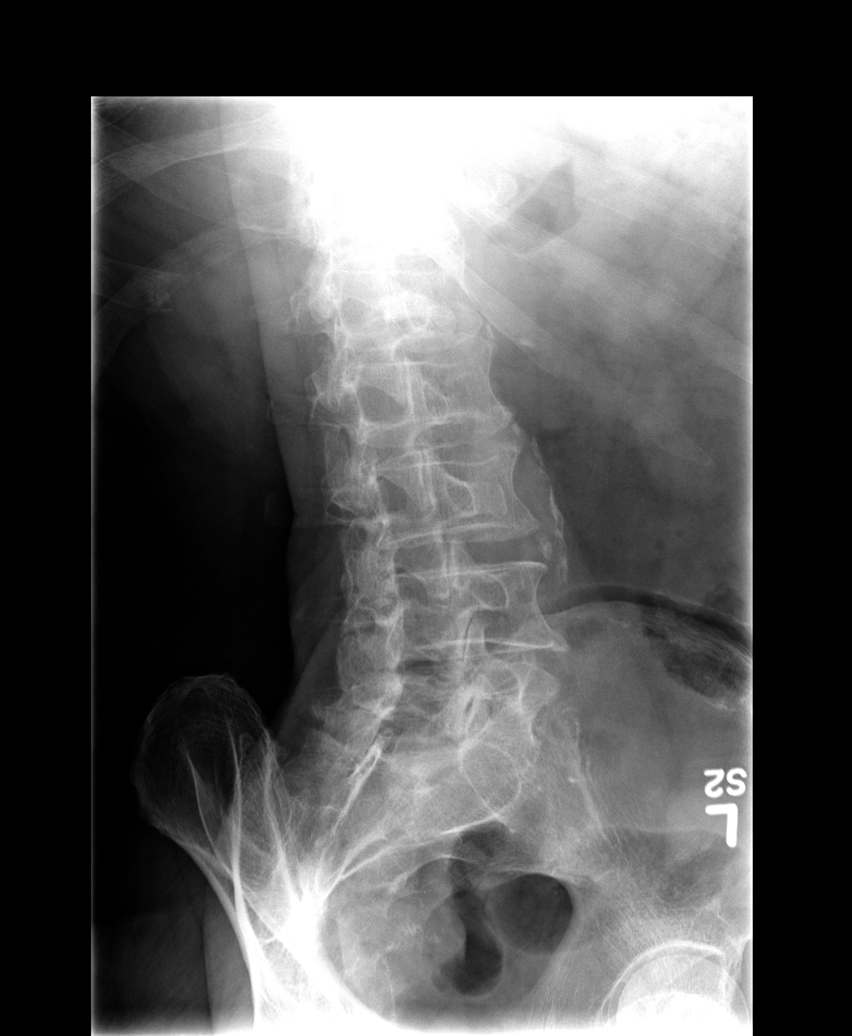

[view not recorded (4 of 5)]
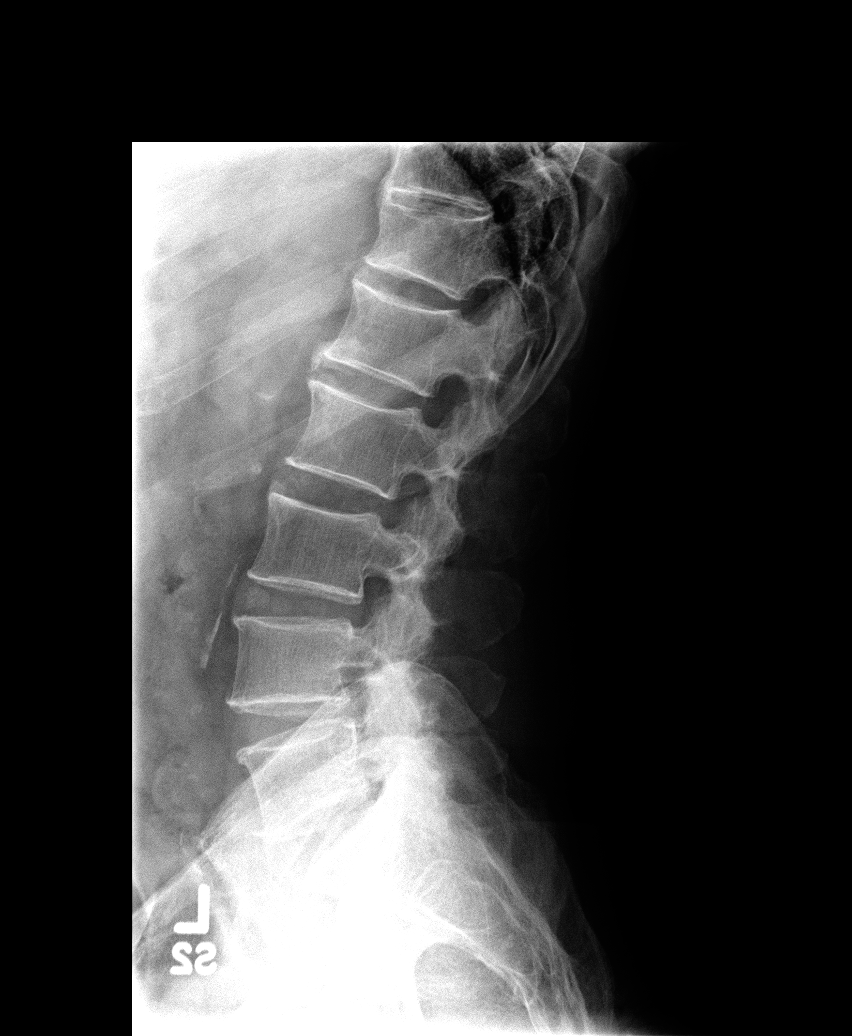

[view not recorded (5 of 5)]
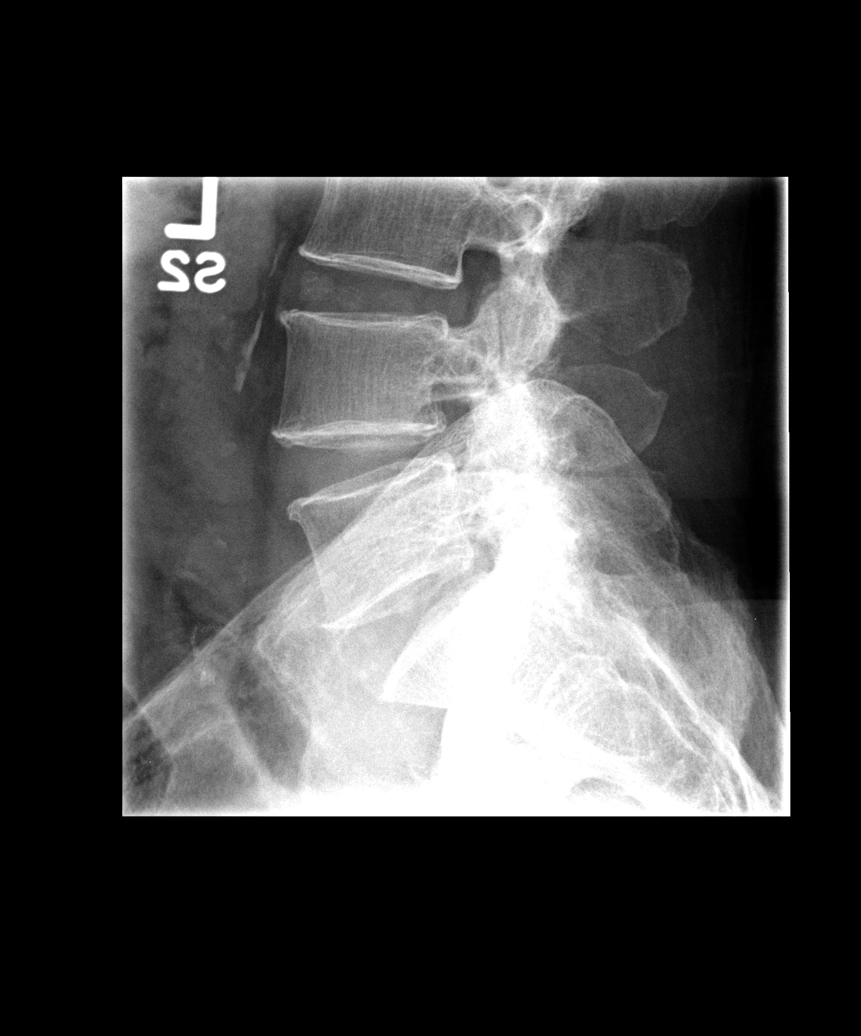

[5 of 5 positions shown; findings below may reference images not displayed]

FINDINGS: There are 5 nonrib bearing lumbar-type vertebral bodies. There is an
age-indeterminate T12 anterior vertebral body compression deformity
with approximately 15% height loss. The alignment is anatomic. There
is no spondylolysis. There is no acute fracture or static listhesis.
There is minimal degenerative disc disease at L4-5 and L5-S1 with
bilateral facet arthropathy.

The SI joints are unremarkable.

There is abdominal aortic atherosclerosis.
IMPRESSION: There is an age-indeterminate T12 anterior vertebral body
compression deformity with approximately 15% height loss. The
abnormality appears to be new compared with the chest x-ray
performed on 03/12/2014. If there is further clinical concern
regarding the acuity of the fracture, further evaluation with bone
scan or MRI is recommended.

Lumbar spine spondylosis at L4-5 and L5-S1.

## 2015-05-15 ENCOUNTER — Ambulatory Visit (INDEPENDENT_AMBULATORY_CARE_PROVIDER_SITE_OTHER): Payer: PPO | Admitting: Cardiovascular Disease

## 2015-05-15 ENCOUNTER — Encounter: Payer: Self-pay | Admitting: Cardiovascular Disease

## 2015-05-15 VITALS — BP 134/88 | HR 88 | Ht 66.75 in | Wt 222.2 lb

## 2015-05-15 DIAGNOSIS — I2510 Atherosclerotic heart disease of native coronary artery without angina pectoris: Secondary | ICD-10-CM

## 2015-05-15 DIAGNOSIS — I2583 Coronary atherosclerosis due to lipid rich plaque: Secondary | ICD-10-CM

## 2015-05-15 DIAGNOSIS — I82402 Acute embolism and thrombosis of unspecified deep veins of left lower extremity: Secondary | ICD-10-CM

## 2015-05-15 DIAGNOSIS — Z79899 Other long term (current) drug therapy: Secondary | ICD-10-CM | POA: Diagnosis not present

## 2015-05-15 DIAGNOSIS — E785 Hyperlipidemia, unspecified: Secondary | ICD-10-CM | POA: Diagnosis not present

## 2015-05-15 DIAGNOSIS — I10 Essential (primary) hypertension: Secondary | ICD-10-CM

## 2015-05-15 NOTE — Assessment & Plan Note (Signed)
History of CAD status post cardiac catheterization performed by myself 11/15/98 revealing three-vessel disease with preserved LV function. Medical therapy was recommended at that time. He had a 30% distal left main, 75% mid LAD, 50% obtuse marginal branch stenosis and a total RCA with left-to-right collaterals, normal LV function. He had a Myoview stress test performed 08/06/10 which is nonischemic. He was asymptomatic until last year when he developed chest pain. I plan on performing car to catheterization however he was found to be profoundly anemic on the preoperative labs. Ultimately when he was transfused his angina resolved and he has had no recurrent symptoms.

## 2015-05-15 NOTE — Progress Notes (Signed)
05/15/2015 Manuel Grant   [birth year]  [id number]  Primary Physician [name], MD Primary Cardiologist: [name] MD Renae Gloss    HPI: The patient is a very pleasant 69 year old mildly to moderately overweight married Caucasian male father of 60, grandfather to 3 grandchildren who I saw 8 months ago. He has a history of ischemic heart disease status post cardiac catheterization by myself November 15, 1998 revealing 3-vessel disease with preserved LV function. Medical therapy was recommended at that time. He had a 30% distal left main, a 75% mid LAD, a 50% OM and a total RCA with left-to-right collaterals and normal LV function. His other problems include hypertension and hyperlipidemia. He is totally asymptomatic. His last Myoview performed August 06, 2010 was nonischemic. Lipid profile performed 03/08/14 revealed a total cholesterol of 98, LDL 31 and HDL 41. The last saw him in October he developed progressive dyspnea on exertion and substernal chest pain. I plan to performing outpatient cardiac catheterization on him however his preprocedure labs revealed profound anemia requiring workup including endoscopy. He was placed on oral therapy for H. Pylori and transfused. His cardiac symptoms have resolved. Back in March he was diagnosed with a left lower extremity deep venous thrombosis involving his common femoral, popliteal and peroneal veins. He was placed on Xarelto oral anticoagulation at that time which resulted in clinical improvement. Followup his Dopplers in July revealed mild partial resolution and venous Dopplers once again performed 02/09/15 showed chronic DVT without acute changes. He saw Dr. Stanford Breed approximately ago who confirmed that being off chronic regulation was appropriate.   Current Outpatient Prescriptions  Medication Sig Dispense Refill  . aspirin EC 81 MG tablet Take 1 tablet (81 mg total) by mouth daily. 90 tablet 3  . cyanocobalamin  (,VITAMIN B-12,) 1000 MCG/ML injection INJECT 1ML INTO THE MUSCLE EVERY 30 DAYS 12 mL 3  . glipiZIDE (GLUCOTROL XL) 10 MG 24 hr tablet TAKE 1 TABLET BY MOUTH ONCE A DAY 30 tablet 5  . isosorbide mononitrate (IMDUR) 60 MG 24 hr tablet Take 1 tablet (60 mg total) by mouth daily. 90 tablet 1  . lisinopril-hydrochlorothiazide (PRINZIDE,ZESTORETIC) 20-12.5 MG per tablet Take 1 tablet by mouth daily. 90 tablet 1  . lovastatin (MEVACOR) 10 MG tablet Take 1 tablet (10 mg total) by mouth at bedtime. 30 tablet 10  . metFORMIN (GLUCOPHAGE) 1000 MG tablet Take 1 tablet (1,000 mg total) by mouth 2 (two) times daily.    . metoprolol succinate (TOPROL-XL) 50 MG 24 hr tablet Take 1 1/2 tablets daily. Take with or immediately following a meal. (Patient taking differently: Take 75 mg by mouth daily. Take 1 1/2 tablets (75 mg) By Mouth Daily. Take with or immediately following a meal.) 45 tablet 5  . minocycline (MINOCIN,DYNACIN) 50 MG capsule Take 1 capsule by mouth daily.    . pantoprazole (PROTONIX) 40 MG tablet Take 1 tablet (40 mg total) by mouth daily. 30 tablet 4  . tiZANidine (ZANAFLEX) 4 MG tablet Take 4 mg by mouth as needed.     . traMADol (ULTRAM) 50 MG tablet Take 50 mg by mouth 3 (three) times daily. Pt takes 2 tablets, 3 times a day for Rheumatoid Arthritis    . zolpidem (AMBIEN) 10 MG tablet TAKE 1 TABLET BY MOUTH EVERY NIGHT AT BEDTIME AS NEEDED FOR SLEEP 30 tablet 5   No current facility-administered medications for this visit.    No Known Allergies  History   Social History  .  Marital Status: Married    Spouse Name: N/A  . Number of Children: 2  . Years of Education: N/A   Occupational History  . retired    Social History Main Topics  . Smoking status: Former Smoker -- 2.00 packs/day for 10 years    Types: Cigarettes    Quit date: 12/28/1974  . Smokeless tobacco: Never Used  . Alcohol Use: 0.0 oz/week    0 Standard drinks or equivalent per week     Comment: 03/15/2014 "6 pack beer  or less/yr"  . Drug Use: No  . Sexual Activity: Not Currently   Other Topics Concern  . Not on file   Social History Narrative   Married 1967   Retired from [employer] work   Lives in [place] with his wife.     Review of Systems: General: negative for chills, fever, night sweats or weight changes.  Cardiovascular: negative for chest pain, dyspnea on exertion, edema, orthopnea, palpitations, paroxysmal nocturnal dyspnea or shortness of breath Dermatological: negative for rash Respiratory: negative for cough or wheezing Urologic: negative for hematuria Abdominal: negative for nausea, vomiting, diarrhea, bright red blood per rectum, melena, or hematemesis Neurologic: negative for visual changes, syncope, or dizziness All other systems reviewed and are otherwise negative except as noted above.    Blood pressure 134/88, pulse 88, height 5' 6.75" (1.695 m), weight 222 lb 3.2 oz (100.789 kg).  General appearance: alert and no distress Neck: no adenopathy, no carotid bruit, no JVD, supple, symmetrical, trachea midline and thyroid not enlarged, symmetric, no tenderness/mass/nodules Lungs: clear to auscultation bilaterally Heart: regular rate and rhythm, S1, S2 normal, no murmur, click, rub or gallop Extremities: extremities normal, atraumatic, no cyanosis or edema  EKG not performed today  ASSESSMENT AND PLAN:   CAD (coronary artery disease) History of CAD status post cardiac catheterization performed by myself 11/15/98 revealing three-vessel disease with preserved LV function. Medical therapy was recommended at that time. He had a 30% distal left main, 75% mid LAD, 50% obtuse marginal branch stenosis and a total RCA with left-to-right collaterals, normal LV function. He had a Myoview stress test performed 08/06/10 which is nonischemic. He was asymptomatic until last year when he developed chest pain. I plan on performing car to catheterization however he was found to be profoundly  anemic on the preoperative labs. Ultimately when he was transfused his angina resolved and he has had no recurrent symptoms.   Essential hypertension, benign History of hypertension blood pressure measured at 134/88. He is on lisinopril, hydrochlorothiazide, and beta blocker. Continue current meds at current dose   DVT (deep venous thrombosis) History of left lower extremity DVT involving the common femoral, popliteal and peroneal veins. He was placed on Xarelto oral anti-coagulation at that time resulted in clinical improvement. He was on this for approximately 9 months and then discontinued. Recent venous Doppler showed chronic DVT without acute changes. He is on aspirin.       [name] MD FACP,FACC,FAHA, [facility] 05/15/2015 10:37 AM

## 2015-05-15 NOTE — Assessment & Plan Note (Signed)
History of left lower extremity DVT involving the common femoral, popliteal and peroneal veins. He was placed on Xarelto oral anti-coagulation at that time resulted in clinical improvement. He was on this for approximately 9 months and then discontinued. Recent venous Doppler showed chronic DVT without acute changes. He is on aspirin.

## 2015-05-15 NOTE — Patient Instructions (Addendum)
Your physician recommends that you return for lab work at your earliest [place].  Dr Gwenlyn Found recommends that you schedule a follow-up appointment in 1 year. You will receive a reminder letter in the mail two months in advance. If you don't receive a letter, please call our office to schedule the follow-up appointment.

## 2015-05-15 NOTE — Assessment & Plan Note (Signed)
History of hypertension blood pressure measured at 134/88. He is on lisinopril, hydrochlorothiazide, and beta blocker. Continue current meds at current dose

## 2015-05-20 ENCOUNTER — Telehealth: Payer: Self-pay | Admitting: *Deleted

## 2015-05-20 NOTE — Telephone Encounter (Signed)
-----   Message from Larina Bras, [place] sent at 03/11/2015  5:04 PM EDT ----- Pt needs cbc and ibc 05/2015. Call to remind him. Labs already entered in epic

## 2015-05-20 NOTE — Telephone Encounter (Signed)
Patient reminded to come for labs. He verbalizes understanding.

## 2015-05-21 ENCOUNTER — Other Ambulatory Visit: Payer: Self-pay | Admitting: Family Medicine

## 2015-05-21 DIAGNOSIS — E1149 Type 2 diabetes mellitus with other diabetic neurological complication: Secondary | ICD-10-CM

## 2015-05-21 DIAGNOSIS — E538 Deficiency of other specified B group vitamins: Secondary | ICD-10-CM

## 2015-05-21 NOTE — Telephone Encounter (Signed)
Received refill request electronically from pharmacy. Last refill 11/14/14 #30/5, last office visit 04/18/14. Is it okay to refill medication?

## 2015-05-22 NOTE — Telephone Encounter (Signed)
Please schedule appointments as instructed.

## 2015-05-22 NOTE — Telephone Encounter (Signed)
Rx called to pharmacy as instructed. 

## 2015-05-22 NOTE — Telephone Encounter (Signed)
Please call in.  Needs DM2 f/u scheduled.  Then CPE schedule for about 3 months later.  Orders for labs are in.  Thanks.

## 2015-05-28 ENCOUNTER — Other Ambulatory Visit: Payer: Self-pay | Admitting: Family Medicine

## 2015-05-30 ENCOUNTER — Encounter: Payer: Self-pay | Admitting: Internal Medicine

## 2015-05-30 ENCOUNTER — Other Ambulatory Visit: Payer: Self-pay

## 2015-05-30 ENCOUNTER — Other Ambulatory Visit (INDEPENDENT_AMBULATORY_CARE_PROVIDER_SITE_OTHER): Payer: PPO

## 2015-05-30 ENCOUNTER — Ambulatory Visit (INDEPENDENT_AMBULATORY_CARE_PROVIDER_SITE_OTHER): Payer: PPO | Admitting: Internal Medicine

## 2015-05-30 VITALS — BP 148/78 | HR 70 | Ht 67.75 in | Wt 219.2 lb

## 2015-05-30 DIAGNOSIS — D509 Iron deficiency anemia, unspecified: Secondary | ICD-10-CM

## 2015-05-30 DIAGNOSIS — K289 Gastrojejunal ulcer, unspecified as acute or chronic, without hemorrhage or perforation: Secondary | ICD-10-CM | POA: Diagnosis not present

## 2015-05-30 LAB — CBC WITH DIFFERENTIAL/PLATELET
BASOS ABS: 0 10*3/uL (ref 0.0–0.1)
Basophils Relative: 0.5 % (ref 0.0–3.0)
EOS ABS: 0.1 10*3/uL (ref 0.0–0.7)
Eosinophils Relative: 1.6 % (ref 0.0–5.0)
HCT: 44.1 % (ref 39.0–52.0)
Hemoglobin: 14.7 g/dL (ref 13.0–17.0)
Lymphocytes Relative: 24.4 % (ref 12.0–46.0)
Lymphs Abs: 1.7 10*3/uL (ref 0.7–4.0)
MCHC: 33.3 g/dL (ref 30.0–36.0)
MCV: 87 fl (ref 78.0–100.0)
Monocytes Absolute: 0.7 10*3/uL (ref 0.1–1.0)
Monocytes Relative: 9.5 % (ref 3.0–12.0)
NEUTROS PCT: 64 % (ref 43.0–77.0)
Neutro Abs: 4.4 10*3/uL (ref 1.4–7.7)
Platelets: 150 10*3/uL (ref 150.0–400.0)
RBC: 5.07 Mil/uL (ref 4.22–5.81)
RDW: 16.5 % — ABNORMAL HIGH (ref 11.5–15.5)
WBC: 6.9 10*3/uL (ref 4.0–10.5)

## 2015-05-30 LAB — IBC PANEL
IRON: 94 ug/dL (ref 42–165)
Saturation Ratios: 23.3 % (ref 20.0–50.0)
Transferrin: 288 mg/dL (ref 212.0–360.0)

## 2015-05-30 LAB — FERRITIN: Ferritin: 81.4 ng/mL (ref 22.0–322.0)

## 2015-05-30 NOTE — Progress Notes (Signed)
Subjective:    Patient ID: Manuel Grant, male    DOB: [birth year], 69 y.o.   MRN: [id number]  HPI Manuel Grant is a 69 yo male with PMH of ulcerative colitis status post subtotal colectomy with J-pouch in 2005, chronic anastomotic ulcer of the J-pouch, history of peptic ulcer disease, iron deficiency anemia and left lower extremity DVT previously on Xarelto, diabetes and hypertension who is here for follow-up. He is here today with his wife. No further rectal bleeding. He is feeling well. He is off of Xarelto. No abdominal pain. Eating well. No nausea or vomiting. No dysphagia. 8-10 bowel movements a day as per his usual without blood or melena. He has been taking oral iron dose is been off for several days recently.   Review of Systems As per history of present illness, otherwise negative  Current Medications, Allergies, Past Medical History, Past Surgical History, Family History and Social History were reviewed in [employer] record.     Objective:   Physical Exam BP 148/78 mmHg  Pulse 70  Ht 5' 7.75" (1.721 m)  Wt 219 lb 4 oz (99.451 kg)  BMI 33.58 kg/m2 Constitutional: Well-developed and well-nourished. No distress. HEENT: Normocephalic and atraumatic. Oropharynx is clear and moist. No oropharyngeal exudate. Conjunctivae are normal.  No scleral icterus. Neck: Neck supple. Trachea midline. Cardiovascular: Normal rate, regular rhythm and intact distal pulses. No M/R/G Pulmonary/chest: Effort normal and breath sounds normal. No wheezing, rales or rhonchi. Abdominal: Soft, nontender, nondistended. Bowel sounds active throughout. There are no masses palpable. No hepatosplenomegaly. Extremities: no clubbing, cyanosis, or edema Lymphadenopathy: No cervical adenopathy noted. Neurological: Alert and oriented to person place and time. Skin: Skin is warm and dry. No rashes noted. Psychiatric: Normal mood and affect. Behavior is normal.  CBC    Component  Value Date/Time   WBC 6.9 05/30/2015 1102   WBC 4.5 05/24/2014 0817   RBC 5.07 05/30/2015 1102   RBC 4.60 05/24/2014 0817   HGB 14.7 05/30/2015 1102   HGB 11.6* 05/24/2014 0817   HCT 44.1 05/30/2015 1102   HCT 37.3* 05/24/2014 0817   PLT 150.0 05/30/2015 1102   PLT 152 05/24/2014 0817   MCV 87.0 05/30/2015 1102   MCV 81.1 05/24/2014 0817   MCH 26.0 02/11/2015 0540   MCH 25.2* 05/24/2014 0817   MCHC 33.3 05/30/2015 1102   MCHC 31.1* 05/24/2014 0817   RDW 16.5* 05/30/2015 1102   RDW 26.3* 05/24/2014 0817   LYMPHSABS 1.7 05/30/2015 1102   LYMPHSABS 1.5 05/24/2014 0817   MONOABS 0.7 05/30/2015 1102   MONOABS 0.5 05/24/2014 0817   EOSABS 0.1 05/30/2015 1102   EOSABS 0.2 05/24/2014 0817   BASOSABS 0.0 05/30/2015 1102   BASOSABS 0.0 05/24/2014 0817    CMP     Component Value Date/Time   NA 140 02/09/2015 0305   NA 142 05/24/2014 0817   K 3.3* 02/09/2015 0305   K 3.9 05/24/2014 0817   CL 105 02/09/2015 0305   CO2 27 02/09/2015 0305   CO2 26 05/24/2014 0817   GLUCOSE 233* 02/09/2015 0305   GLUCOSE 172* 05/24/2014 0817   BUN 16 02/09/2015 0305   BUN 14.5 05/24/2014 0817   CREATININE 1.27 02/09/2015 0305   CREATININE 1.1 05/24/2014 0817   CREATININE 1.01 03/12/2014 1246   CALCIUM 9.5 02/09/2015 0305   CALCIUM 9.7 05/24/2014 0817   PROT 7.8 02/08/2015 1955   PROT 7.3 05/24/2014 0817   ALBUMIN 4.3 02/08/2015 1955   ALBUMIN 3.7  05/24/2014 0817   AST 33 02/08/2015 1955   AST 19 05/24/2014 0817   ALT 26 02/08/2015 1955   ALT 11 05/24/2014 0817   ALKPHOS 67 02/08/2015 1955   ALKPHOS 53 05/24/2014 0817   BILITOT 0.5 02/08/2015 1955   BILITOT 0.40 05/24/2014 0817   GFRNONAA 56* 02/09/2015 0305   GFRAA 65* 02/09/2015 0305    Iron/TIBC/Ferritin/ %Sat    Component Value Date/Time   IRON 94 05/30/2015 1102   IRON 49 04/30/2014 1543   TIBC 274 04/30/2014 1543   TIBC 405 03/15/2014 1600   FERRITIN 81.4 05/30/2015 1102   FERRITIN 206 04/30/2014 1543   IRONPCTSAT 23.3  05/30/2015 1102   IRONPCTSAT 18* 04/30/2014 1543       Assessment & Plan:  69 yo male with PMH of ulcerative colitis status post subtotal colectomy with J-pouch in 2005, chronic anastomotic ulcer of the J-pouch, history of peptic ulcer disease, iron deficiency anemia and left lower extremity DVT previously on Xarelto, diabetes and hypertension who is here for follow-up.  1. Chronic anastomotic ulcer of the J-pouch with bleeding and iron deficiency anemia -- labs checked today. Iron studies have normalized. Hemoglobin has normalized. Given the lack of Xarelto he hasn't had no further rectal bleeding. I feel he can stop iron. Like to recheck iron and CBC in 6 months. Call me with any recurrent rectal bleeding. Follow-up in 6 months  2. History of DVT -- now off anticoagulation as per Dr. Gwenlyn Found

## 2015-05-30 NOTE — Patient Instructions (Signed)
Your physician has requested that you go to the basement for lab work before leaving today. Hold your oral Iron for now.

## 2015-05-31 LAB — HEPATIC FUNCTION PANEL
ALK PHOS: 50 U/L (ref 39–117)
ALT: 26 U/L (ref 0–53)
AST: 31 U/L (ref 0–37)
Albumin: 4.1 g/dL (ref 3.5–5.2)
Bilirubin, Direct: 0.1 mg/dL (ref 0.0–0.3)
Indirect Bilirubin: 0.5 mg/dL (ref 0.2–1.2)
Total Bilirubin: 0.6 mg/dL (ref 0.2–1.2)
Total Protein: 7.2 g/dL (ref 6.0–8.3)

## 2015-05-31 LAB — LIPID PANEL
Cholesterol: 144 mg/dL (ref 0–200)
HDL: 40 mg/dL (ref >=40)
LDL Cholesterol: 62 mg/dL (ref 0–99)
Total CHOL/HDL Ratio: 3.6 Ratio
Triglycerides: 212 mg/dL — ABNORMAL HIGH (ref <150)
VLDL: 42 mg/dL — AB (ref 0–40)

## 2015-06-25 ENCOUNTER — Telehealth: Payer: Self-pay | Admitting: Cardiovascular Disease

## 2015-06-25 ENCOUNTER — Other Ambulatory Visit: Payer: Self-pay | Admitting: *Deleted

## 2015-06-25 MED ORDER — METOPROLOL SUCCINATE ER 50 MG PO TB24: 75.0000 mg | ORAL_TABLET | Freq: Every day | ORAL | Status: DC

## 2015-06-25 NOTE — Telephone Encounter (Signed)
Manuel Grant is calling from [facility] and has a question about the Metropolol Succinate .[name] Please call   Thanks

## 2015-06-25 NOTE — Telephone Encounter (Signed)
Spoke with pt pharm, confirmed dosage of metoprolol.

## 2015-07-03 ENCOUNTER — Other Ambulatory Visit: Payer: Self-pay | Admitting: Family Medicine

## 2015-07-24 ENCOUNTER — Other Ambulatory Visit: Payer: Self-pay | Admitting: Family Medicine

## 2015-07-24 NOTE — Telephone Encounter (Signed)
Received refill request electronically Last office visit 03/2214 PCP listed is Dennard Schaumann

## 2015-07-25 NOTE — Telephone Encounter (Signed)
Sent.  Has f/u with new PCP pending.  I'll sign off on the chart.  Routed to new PCP as FYI.

## 2015-07-25 NOTE — Telephone Encounter (Signed)
Ok with refill 

## 2015-07-31 ENCOUNTER — Encounter: Payer: Self-pay | Admitting: Family Medicine

## 2015-08-07 ENCOUNTER — Other Ambulatory Visit: Payer: Self-pay | Admitting: *Deleted

## 2015-08-07 MED ORDER — PANTOPRAZOLE SODIUM 40 MG PO TBEC: 40.0000 mg | DELAYED_RELEASE_TABLET | Freq: Every day | ORAL | Status: DC

## 2015-08-08 ENCOUNTER — Ambulatory Visit (INDEPENDENT_AMBULATORY_CARE_PROVIDER_SITE_OTHER): Payer: PPO | Admitting: Family Medicine

## 2015-08-08 ENCOUNTER — Encounter: Payer: Self-pay | Admitting: Family Medicine

## 2015-08-08 VITALS — BP 160/100 | HR 78 | Temp 98.5°F | Resp 18 | Ht 66.75 in | Wt 219.0 lb

## 2015-08-08 DIAGNOSIS — M5030 Other cervical disc degeneration, unspecified cervical region: Secondary | ICD-10-CM

## 2015-08-08 DIAGNOSIS — E119 Type 2 diabetes mellitus without complications: Secondary | ICD-10-CM

## 2015-08-08 DIAGNOSIS — Z7689 Persons encountering health services in other specified circumstances: Secondary | ICD-10-CM

## 2015-08-08 DIAGNOSIS — I2510 Atherosclerotic heart disease of native coronary artery without angina pectoris: Secondary | ICD-10-CM | POA: Diagnosis not present

## 2015-08-08 DIAGNOSIS — Z23 Encounter for immunization: Secondary | ICD-10-CM

## 2015-08-08 DIAGNOSIS — D62 Acute posthemorrhagic anemia: Secondary | ICD-10-CM | POA: Diagnosis not present

## 2015-08-08 DIAGNOSIS — Z7189 Other specified counseling: Secondary | ICD-10-CM | POA: Diagnosis not present

## 2015-08-08 LAB — URINALYSIS, MICROSCOPIC ONLY
Casts: NONE SEEN [LPF]
Crystals: NONE SEEN [HPF]
YEAST: NONE SEEN [HPF]

## 2015-08-08 LAB — URINALYSIS, ROUTINE W REFLEX MICROSCOPIC
BILIRUBIN URINE: NEGATIVE
Glucose, UA: NEGATIVE
KETONES UR: NEGATIVE
Nitrite: NEGATIVE
Specific Gravity, Urine: 1.02 (ref 1.001–1.035)
pH: 6.5 (ref 5.0–8.0)

## 2015-08-08 MED ORDER — TRAMADOL HCL 50 MG PO TABS: 50.0000 mg | ORAL_TABLET | Freq: Three times a day (TID) | ORAL | Status: DC

## 2015-08-08 MED ORDER — TRAMADOL HCL 50 MG PO TABS: 50.0000 mg | ORAL_TABLET | Freq: Three times a day (TID) | ORAL | Status: DC | PRN

## 2015-08-08 MED ORDER — TRAMADOL HCL 50 MG PO TABS: 100.0000 mg | ORAL_TABLET | Freq: Three times a day (TID) | ORAL | Status: DC

## 2015-08-08 NOTE — Progress Notes (Signed)
Subjective:    Patient ID: Manuel Grant, male    DOB: 06-06-1946, 69 y.o.   MRN: [id number]  HPI Patient is a 69 year old white male with a history of ulcerative colitis status post colectomy. Recently the patient had profound hemorrhagic anemia secondary to an ulcer in both his stomach and small intestine. He is currently on a proton pump inhibitor. However he also has to take aspirin due to his history of coronary artery disease. At present they're managing his coronary artery disease medically. He denies any angina, chest pain, shortness of breath, dyspnea on exertion. He also has a history of diabetes mellitus type 2. He is overdue to recheck his hemoglobin A1c along with urine microalbumin. He is also due to check a PSA. Patient also has a history of severe cervical degenerative disc disease. He is currently taking 100 mg try and I'll every 8 hours. I personally reviewed the x-rays of his neck from April 2015 which reveals degenerative disc disease primarily at C6-C7 but also present at C5 and C6. He denies any neuropathy in the arms or weakness in the arms. Colonoscopy is up-to-date as was performed this year. Immunizations are up-to-date except Prevnar 13. Past Medical History  Diagnosis Date  . Ulcerative colitis     s/p colectomy ~ 2005 at [facility]  . Renal stones     uric acid stones  . Chronic kidney disease   . Hypertension   . Hyperlipidemia   . Rosacea   . Insomnia   . Hiatal hernia   . Arthritis     Knee OA- per [facility]- injected prev by ortho  . Coronary artery disease     a. 10/1998 Cath: LM 30d, LAD 109m OM 50, RCA 100 w/ L->R collats, nl LV ->Med Rx;  b. 07/2010 MV: no ischemia.  .[name] KitchenB12 deficiency   . Osteoarthritis   . Type II diabetes mellitus   . Myocardial infarction 1999    "heart occasion" (03/15/2014)  . Pneumonia 1970's  . Anemia   . Deep venous thrombosis     left lower extremity  . GI bleed   . Colon polyps   . Duodenal ulcer    Past Surgical  History  Procedure Laterality Date  . Subtotal colectomy  2005    [facility] . Knee arthroscopy Bilateral 1990's  . Hernia repair Right ~ 2006    "abdomen; was as a result of a surgery"  . Cardiac catheterization  11/15/1998    30% distal left main, 75% mid LAD, 50% OM and total RCA with left to right collateral and normal LV function.  . Esophagogastroduodenoscopy N/A 03/16/2014    Procedure: ESOPHAGOGASTRODUODENOSCOPY (EGD);  Surgeon: JJerene Bears MD;  [facility];  Service: Endoscopy;  Laterality: N/A;  . Flexible sigmoidoscopy N/A 03/16/2014    Procedure: FLEXIBLE SIGMOIDOSCOPY;  Surgeon: JJerene Bears MD;  [facility];  Service: Endoscopy;  Laterality: N/A;  . UKoreaechocardiography  11/16/2007    mild concentric LVH,impaired LV relaxagtion,boderline RV enlargement,nodular thickening of the noncoronary cusup  . Nm myocar perf wall motion  08/06/2010    normal  . Colon surgery     Current Outpatient Prescriptions on File Prior to Visit  Medication Sig Dispense Refill  . aspirin EC 81 MG tablet Take 1 tablet (81 mg total) by mouth daily. 90 tablet 3  . cyanocobalamin (,VITAMIN B-12,) 1000 MCG/ML injection INJECT 1ML INTO THE MUSCLE EVERY 30 DAYS 12  mL 3  . glipiZIDE (GLUCOTROL XL) 10 MG 24 hr tablet TAKE 1 TABLET BY MOUTH ONCE A DAY 30 tablet 0  . isosorbide mononitrate (IMDUR) 60 MG 24 hr tablet Take 1 tablet (60 mg total) by mouth daily. 90 tablet 1  . lisinopril-hydrochlorothiazide (PRINZIDE,ZESTORETIC) 20-12.5 MG per tablet Take 1 tablet by mouth daily. 90 tablet 1  . lovastatin (MEVACOR) 10 MG tablet Take 1 tablet (10 mg total) by mouth at bedtime. 30 tablet 10  . metFORMIN (GLUCOPHAGE) 1000 MG tablet Take 1 tablet (1,000 mg total) by mouth 2 (two) times daily.    . metoprolol succinate (TOPROL-XL) 50 MG 24 hr tablet Take 2 tablets (100 mg total) by mouth daily. Take 1 1/2 tablets (75 mg) By Mouth Daily. Take with or immediately following a  meal. 45 tablet 5  . minocycline (MINOCIN,DYNACIN) 50 MG capsule Take 1 capsule by mouth daily.    . pantoprazole (PROTONIX) 40 MG tablet Take 1 tablet (40 mg total) by mouth daily. 30 tablet 2  . tiZANidine (ZANAFLEX) 4 MG tablet Take 4 mg by mouth as needed.     . zolpidem (AMBIEN) 10 MG tablet TAKE 1 TABLET BY MOUTH EVERY NIGHT AT BEDTIME AS NEEDED FOR SLEEP 30 tablet 2   No current facility-administered medications on file prior to visit.   No Known Allergies Social History   Social History  . Marital Status: Married    Spouse Name: N/A  . Number of Children: 2  . Years of Education: N/A   Occupational History  . retired    Social History Main Topics  . Smoking status: Former Smoker -- 2.00 packs/day for 10 years    Types: Cigarettes    Quit date: 12/28/1974  . Smokeless tobacco: Never Used  . Alcohol Use: 0.0 oz/week    0 Standard drinks or equivalent per week     Comment: 03/15/2014 "6 pack beer or less/yr"  . Drug Use: No  . Sexual Activity: Not Currently   Other Topics Concern  . Not on file   Social History Narrative   Married 1967   Retired from [employer] work   Lives in [place] with his wife.   Family History  Problem Relation Age of Onset  . Heart disease Mother   . Diabetes Mother   . Arthritis Mother   . Hypertension Mother   . COPD Father   . Emphysema Father   . Asthma Father   . Colon cancer Neg Hx   . Prostate cancer Neg Hx   . Colon polyps Brother         Review of Systems  All other systems reviewed and are negative.      Objective:   Physical Exam  Constitutional: He appears well-developed and well-nourished.  Neck: Neck supple.  Cardiovascular: Normal rate, regular rhythm, normal heart sounds and intact distal pulses.  Exam reveals no gallop and no friction rub.   No murmur heard. Pulmonary/Chest: Effort normal and breath sounds normal. No respiratory distress. He has no wheezes. He has no rales. He exhibits no tenderness.    Abdominal: Soft. Bowel sounds are normal. He exhibits no distension and no mass. There is no tenderness. There is no rebound and no guarding.  Musculoskeletal: He exhibits no edema.  Lymphadenopathy:    He has no cervical adenopathy.  Skin: No rash noted. No erythema.  Vitals reviewed.         Assessment & Plan:  DDD (degenerative disc disease), cervical -  Plan: CBC with Differential/Platelet, COMPLETE METABOLIC PANEL WITH GFR, PSA, Medicare, Microalbumin, urine, Hemoglobin A1c, traMADol (ULTRAM) 50 MG tablet, traMADol (ULTRAM) 50 MG tablet  Need for prophylactic vaccination against Streptococcus pneumoniae (pneumococcus) - Plan: Pneumococcal conjugate vaccine 13-valent IM  Establishing care with new doctor, encounter for - Plan: Urinalysis, Routine w reflex microscopic (not at [facility])  Diabetes mellitus type II, non insulin dependent - Plan: Urinalysis, Routine w reflex microscopic (not at [facility])  ASCVD (arteriosclerotic cardiovascular disease)  Acute post-hemorrhagic anemia  for the patient's degenerative disc disease in his cervical spine, I will start the patient on Ultram 100 mg by mouth every 8 hours. I will give him 180 tablets of 50 mg tramadol every month. Due to his history of an upper GI bleed I recommended that he not take NSAIDs ever again. I will also check a hemoglobin A1c. Goal hemoglobin A1c is less than 6.5. I will also check a urine microalbumin along with a BMP to monitor his kidney function. Patient recently had liver function tests and a fasting lipid panel obtained in June which were excellent with an LDL well below his goal of 70. Patient's blood pressure today is elevated but I believe this may be anxiety related. I will check his renal function. If his renal function can tolerate it, I would like to start the patient on amlodipine for his hypertension.

## 2015-08-09 LAB — CBC WITH DIFFERENTIAL/PLATELET
Basophils Absolute: 0 10*3/uL (ref 0.0–0.1)
Basophils Relative: 1 % (ref 0–1)
EOS ABS: 0.1 10*3/uL (ref 0.0–0.7)
EOS PCT: 2 % (ref 0–5)
HCT: 46.6 % (ref 39.0–52.0)
HEMOGLOBIN: 15.4 g/dL (ref 13.0–17.0)
LYMPHS ABS: 1.8 10*3/uL (ref 0.7–4.0)
Lymphocytes Relative: 38 % (ref 12–46)
MCH: 29.1 pg (ref 26.0–34.0)
MCHC: 33 g/dL (ref 30.0–36.0)
MCV: 87.9 fL (ref 78.0–100.0)
MONO ABS: 0.4 10*3/uL (ref 0.1–1.0)
MPV: 12.1 fL (ref 8.6–12.4)
Monocytes Relative: 9 % (ref 3–12)
NEUTROS PCT: 50 % (ref 43–77)
Neutro Abs: 2.4 10*3/uL (ref 1.7–7.7)
Platelets: 142 10*3/uL — ABNORMAL LOW (ref 150–400)
RBC: 5.3 MIL/uL (ref 4.22–5.81)
RDW: 15.3 % (ref 11.5–15.5)
WBC: 4.8 10*3/uL (ref 4.0–10.5)

## 2015-08-09 LAB — HEMOGLOBIN A1C
Hgb A1c MFr Bld: 8.6 % — ABNORMAL HIGH (ref <5.7)
Mean Plasma Glucose: 200 mg/dL — ABNORMAL HIGH (ref <117)

## 2015-08-09 LAB — COMPLETE METABOLIC PANEL WITH GFR
ALBUMIN: 4.3 g/dL (ref 3.6–5.1)
ALT: 30 U/L (ref 9–46)
AST: 37 U/L — ABNORMAL HIGH (ref 10–35)
Alkaline Phosphatase: 51 U/L (ref 40–115)
BUN: 13 mg/dL (ref 7–25)
CO2: 28 mmol/L (ref 20–31)
Calcium: 9.2 mg/dL (ref 8.6–10.3)
Chloride: 102 mmol/L (ref 98–110)
Creat: 1.06 mg/dL (ref 0.70–1.18)
GFR, EST AFRICAN AMERICAN: 82 mL/min (ref >=60)
GFR, EST NON AFRICAN AMERICAN: 71 mL/min (ref >=60)
Glucose, Bld: 84 mg/dL (ref 70–99)
POTASSIUM: 4.1 mmol/L (ref 3.5–5.3)
SODIUM: 142 mmol/L (ref 135–146)
Total Bilirubin: 0.5 mg/dL (ref 0.2–1.2)
Total Protein: 7.4 g/dL (ref 6.1–8.1)

## 2015-08-09 LAB — MICROALBUMIN, URINE: Microalb, Ur: 26.4 mg/dL — ABNORMAL HIGH (ref <2.0)

## 2015-08-09 LAB — PSA, MEDICARE: PSA: 0.52 ng/mL (ref <=4.00)

## 2015-08-14 ENCOUNTER — Encounter: Payer: Self-pay | Admitting: Family Medicine

## 2015-08-19 ENCOUNTER — Other Ambulatory Visit: Payer: Self-pay | Admitting: Family Medicine

## 2015-08-19 MED ORDER — CIPROFLOXACIN HCL 500 MG PO TABS: 500.0000 mg | ORAL_TABLET | Freq: Two times a day (BID) | ORAL | Status: DC

## 2015-08-19 MED ORDER — SITAGLIPTIN PHOSPHATE 100 MG PO TABS: 100.0000 mg | ORAL_TABLET | Freq: Every day | ORAL | Status: DC

## 2015-08-21 ENCOUNTER — Other Ambulatory Visit: Payer: Self-pay | Admitting: Family Medicine

## 2015-08-21 NOTE — Telephone Encounter (Signed)
?   OK to Refill  

## 2015-08-22 NOTE — Telephone Encounter (Signed)
ok 

## 2015-08-22 NOTE — Telephone Encounter (Signed)
Medication called to pharmacy. 

## 2015-08-23 ENCOUNTER — Encounter: Payer: Self-pay | Admitting: *Deleted

## 2015-08-30 ENCOUNTER — Other Ambulatory Visit: Payer: Self-pay | Admitting: Family Medicine

## 2015-08-30 NOTE — Telephone Encounter (Signed)
Medication refilled per protocol. 

## 2015-09-20 ENCOUNTER — Other Ambulatory Visit: Payer: Self-pay | Admitting: Cardiovascular Disease

## 2015-09-20 MED ORDER — ISOSORBIDE MONONITRATE ER 60 MG PO TB24: 60.0000 mg | ORAL_TABLET | Freq: Every day | ORAL | Status: DC

## 2015-09-24 ENCOUNTER — Other Ambulatory Visit: Payer: Self-pay | Admitting: Family Medicine

## 2015-09-24 NOTE — Telephone Encounter (Signed)
Ok to refill Ambien??   Last office visit 08/08/2015.  Last refill 08/22/2015.

## 2015-09-24 NOTE — Telephone Encounter (Signed)
ok 

## 2015-09-25 NOTE — Telephone Encounter (Signed)
Medication refilled per protocol. 

## 2015-10-01 ENCOUNTER — Other Ambulatory Visit: Payer: Self-pay | Admitting: Family Medicine

## 2015-10-01 ENCOUNTER — Encounter: Payer: Self-pay | Admitting: Internal Medicine

## 2015-10-01 ENCOUNTER — Other Ambulatory Visit (INDEPENDENT_AMBULATORY_CARE_PROVIDER_SITE_OTHER): Payer: PPO

## 2015-10-01 ENCOUNTER — Ambulatory Visit (INDEPENDENT_AMBULATORY_CARE_PROVIDER_SITE_OTHER): Payer: PPO | Admitting: Internal Medicine

## 2015-10-01 VITALS — BP 126/82 | HR 76 | Ht 66.0 in | Wt 221.6 lb

## 2015-10-01 DIAGNOSIS — K91858 Other complications of intestinal pouch: Secondary | ICD-10-CM | POA: Diagnosis not present

## 2015-10-01 DIAGNOSIS — Z8711 Personal history of peptic ulcer disease: Secondary | ICD-10-CM | POA: Diagnosis not present

## 2015-10-01 DIAGNOSIS — Z862 Personal history of diseases of the blood and blood-forming organs and certain disorders involving the immune mechanism: Secondary | ICD-10-CM

## 2015-10-01 DIAGNOSIS — D509 Iron deficiency anemia, unspecified: Secondary | ICD-10-CM | POA: Diagnosis not present

## 2015-10-01 LAB — CBC WITH DIFFERENTIAL/PLATELET
BASOS PCT: 0.6 % (ref 0.0–3.0)
Basophils Absolute: 0 10*3/uL (ref 0.0–0.1)
Eosinophils Absolute: 0.1 10*3/uL (ref 0.0–0.7)
Eosinophils Relative: 1.8 % (ref 0.0–5.0)
HEMATOCRIT: 42.5 % (ref 39.0–52.0)
Hemoglobin: 14.1 g/dL (ref 13.0–17.0)
LYMPHS PCT: 31.5 % (ref 12.0–46.0)
Lymphs Abs: 1.6 10*3/uL (ref 0.7–4.0)
MCHC: 33.2 g/dL (ref 30.0–36.0)
MCV: 87.2 fl (ref 78.0–100.0)
MONOS PCT: 10.2 % (ref 3.0–12.0)
Monocytes Absolute: 0.5 10*3/uL (ref 0.1–1.0)
NEUTROS ABS: 2.8 10*3/uL (ref 1.4–7.7)
Neutrophils Relative %: 55.9 % (ref 43.0–77.0)
PLATELETS: 133 10*3/uL — AB (ref 150.0–400.0)
RBC: 4.87 Mil/uL (ref 4.22–5.81)
RDW: 15.2 % (ref 11.5–15.5)
WBC: 5.1 10*3/uL (ref 4.0–10.5)

## 2015-10-01 LAB — IBC PANEL
Iron: 49 ug/dL (ref 42–165)
Saturation Ratios: 12.2 % — ABNORMAL LOW (ref 20.0–50.0)
Transferrin: 288 mg/dL (ref 212.0–360.0)

## 2015-10-01 LAB — FERRITIN: Ferritin: 56.7 ng/mL (ref 22.0–322.0)

## 2015-10-01 NOTE — Progress Notes (Signed)
Subjective:    Patient ID: Manuel Grant, male    DOB: [birth year], 69 y.o.   MRN: [id number]  HPI Manuel Grant is a 69 year old male with history of ulcerative colitis status post subtotal colectomy with J-pouch creation in 2005, chronic anastomotic ulcer of the J-pouch, history of PUD, iron deficiency anemia and history of DVT previously on Xarelto who is here for follow-up. He's with his wife today. No further rectal bleeding. He is feeling well. Energy levels remain up. Good appetite. No trouble swallowing. No melena. Has now been off oral iron since last labs in June. No abdominal pain. Stools remain loose as per his usual after colectomy  Review of Systems  as per history of present illness, otherwise negative  Current Medications, Allergies, Past Medical History, Past Surgical History, Family History and Social History were reviewed in [employer] record.     Objective:   Physical Exam BP 126/82 mmHg  Pulse 76  Ht 5' 6"  (1.676 m)  Wt 221 lb 9.6 oz (100.517 kg)  BMI 35.78 kg/m2 Constitutional: Well-developed and well-nourished. No distress. HEENT: Normocephalic and atraumatic. Oropharynx is clear and moist. No oropharyngeal exudate. Conjunctivae are normal.  No scleral icterus. Neck: Neck supple. Trachea midline. Cardiovascular: Normal rate, regular rhythm and intact distal pulses.  Pulmonary/chest: Effort normal and breath sounds normal. No wheezing, rales or rhonchi. Abdominal: Soft, nontender, nondistended. Bowel sounds active throughout. Incisional ventral hernias without tenderness Extremities: no clubbing, cyanosis, or edema Lymphadenopathy: No cervical adenopathy noted. Neurological: Alert and oriented to person place and time. Skin: Skin is warm and dry. No rashes noted. Psychiatric: Normal mood and affect. Behavior is normal.  CBC    Component Value Date/Time   WBC 4.8 08/08/2015 1524   WBC 4.5 05/24/2014 0817   RBC 5.30 08/08/2015  1524   RBC 4.60 05/24/2014 0817   HGB 15.4 08/08/2015 1524   HGB 11.6* 05/24/2014 0817   HCT 46.6 08/08/2015 1524   HCT 37.3* 05/24/2014 0817   PLT 142* 08/08/2015 1524   PLT 152 05/24/2014 0817   MCV 87.9 08/08/2015 1524   MCV 81.1 05/24/2014 0817   MCH 29.1 08/08/2015 1524   MCH 25.2* 05/24/2014 0817   MCHC 33.0 08/08/2015 1524   MCHC 31.1* 05/24/2014 0817   RDW 15.3 08/08/2015 1524   RDW 26.3* 05/24/2014 0817   LYMPHSABS 1.8 08/08/2015 1524   LYMPHSABS 1.5 05/24/2014 0817   MONOABS 0.4 08/08/2015 1524   MONOABS 0.5 05/24/2014 0817   EOSABS 0.1 08/08/2015 1524   EOSABS 0.2 05/24/2014 0817   BASOSABS 0.0 08/08/2015 1524   BASOSABS 0.0 05/24/2014 0817    Iron/TIBC/Ferritin/ %Sat    Component Value Date/Time   IRON 94 05/30/2015 1102   IRON 49 04/30/2014 1543   TIBC 274 04/30/2014 1543   TIBC 405 03/15/2014 1600   FERRITIN 81.4 05/30/2015 1102   FERRITIN 206 04/30/2014 1543   IRONPCTSAT 23.3 05/30/2015 1102   IRONPCTSAT 18* 04/30/2014 1543       Assessment & Plan:  69 year old male with history of ulcerative colitis status post subtotal colectomy with J-pouch creation in 2005, chronic anastomotic ulcer of the J-pouch, history of PUD, iron deficiency anemia and history of DVT previously on Xarelto who is here for follow-up.  1. Chronic anastomotic ulcer of J-pouch with history of bleeding and iron deficiency -- no further obvious bleeding now that he is off anticoagulation. He is feeling well. Repeat hemoglobin and iron studies today. Resume oral iron if necessary  depending on lab tests. Avoid NSAIDs. Follow-up in one year, sooner if necessary  2. History of PUD -- we'll continue once daily PPI  3. History of DVT -- now off anticoagulation as per Dr. Gwenlyn Found

## 2015-10-01 NOTE — Patient Instructions (Signed)
Follow up in 1 year Go to the basement for CBC and Iron studies today

## 2015-10-02 ENCOUNTER — Other Ambulatory Visit: Payer: Self-pay

## 2015-10-02 DIAGNOSIS — D509 Iron deficiency anemia, unspecified: Secondary | ICD-10-CM

## 2015-10-28 ENCOUNTER — Other Ambulatory Visit: Payer: Self-pay | Admitting: Family Medicine

## 2015-10-28 NOTE — Telephone Encounter (Signed)
?   OK to Refill  

## 2015-10-29 NOTE — Telephone Encounter (Signed)
ok 

## 2015-11-23 ENCOUNTER — Other Ambulatory Visit: Payer: Self-pay | Admitting: Family Medicine

## 2015-11-25 ENCOUNTER — Encounter: Payer: Self-pay | Admitting: Family Medicine

## 2015-11-25 NOTE — Telephone Encounter (Signed)
?   OK to Refill  

## 2015-11-25 NOTE — Telephone Encounter (Signed)
Will send letter to make appt

## 2015-11-25 NOTE — Telephone Encounter (Signed)
Ok, he is due for follow up for his diabetes, as is his wife (3 month).

## 2015-11-26 ENCOUNTER — Other Ambulatory Visit: Payer: Self-pay | Admitting: *Deleted

## 2015-11-26 MED ORDER — PANTOPRAZOLE SODIUM 40 MG PO TBEC: 40.0000 mg | DELAYED_RELEASE_TABLET | Freq: Every day | ORAL | Status: DC

## 2015-12-05 ENCOUNTER — Other Ambulatory Visit: Payer: Self-pay | Admitting: Cardiovascular Disease

## 2015-12-05 MED ORDER — LISINOPRIL-HYDROCHLOROTHIAZIDE 20-12.5 MG PO TABS: 1.0000 | ORAL_TABLET | Freq: Every day | ORAL | Status: DC

## 2015-12-10 ENCOUNTER — Other Ambulatory Visit: Payer: Self-pay | Admitting: Cardiovascular Disease

## 2015-12-10 MED ORDER — LOVASTATIN 10 MG PO TABS: 10.0000 mg | ORAL_TABLET | Freq: Every day | ORAL | Status: DC

## 2015-12-12 ENCOUNTER — Encounter: Payer: Self-pay | Admitting: Family Medicine

## 2015-12-12 ENCOUNTER — Ambulatory Visit (INDEPENDENT_AMBULATORY_CARE_PROVIDER_SITE_OTHER): Payer: PPO | Admitting: Family Medicine

## 2015-12-12 ENCOUNTER — Other Ambulatory Visit: Payer: Self-pay | Admitting: Family Medicine

## 2015-12-12 VITALS — BP 160/100 | HR 74 | Temp 98.4°F | Resp 18 | Ht 66.75 in | Wt 224.0 lb

## 2015-12-12 DIAGNOSIS — E119 Type 2 diabetes mellitus without complications: Secondary | ICD-10-CM | POA: Diagnosis not present

## 2015-12-12 DIAGNOSIS — Z1159 Encounter for screening for other viral diseases: Secondary | ICD-10-CM | POA: Diagnosis not present

## 2015-12-12 DIAGNOSIS — I2510 Atherosclerotic heart disease of native coronary artery without angina pectoris: Secondary | ICD-10-CM

## 2015-12-12 DIAGNOSIS — I10 Essential (primary) hypertension: Secondary | ICD-10-CM | POA: Diagnosis not present

## 2015-12-12 MED ORDER — AMLODIPINE BESYLATE 10 MG PO TABS: 10.0000 mg | ORAL_TABLET | Freq: Every day | ORAL | Status: DC

## 2015-12-12 NOTE — Progress Notes (Signed)
Subjective:    Patient ID: Manuel Grant, male    DOB: 1946/08/30, 69 y.o.   MRN: [id number]  HPI 08/08/15 Patient is a 69 year old white male with a history of ulcerative colitis status post colectomy. Recently the patient had profound hemorrhagic anemia secondary to an ulcer in both his stomach and small intestine. He is currently on a proton pump inhibitor. However he also has to take aspirin due to his history of coronary artery disease. At present they're managing his coronary artery disease medically. He denies any angina, chest pain, shortness of breath, dyspnea on exertion. He also has a history of diabetes mellitus type 2. He is overdue to recheck his hemoglobin A1c along with urine microalbumin. He is also due to check a PSA. Patient also has a history of severe cervical degenerative disc disease. He is currently taking 100 mg try and I'll every 8 hours. I personally reviewed the x-rays of his neck from April 2015 which reveals degenerative disc disease primarily at C6-C7 but also present at C5 and C6. He denies any neuropathy in the arms or weakness in the arms. Colonoscopy is up-to-date as was performed this year. Immunizations are up-to-date except Prevnar 13.  At that time, my plan was:  for the patient's degenerative disc disease in his cervical spine, I will start the patient on Ultram 100 mg by mouth every 8 hours. I will give him 180 tablets of 50 mg tramadol every month. Due to his history of an upper GI bleed I recommended that he not take NSAIDs ever again. I will also check a hemoglobin A1c. Goal hemoglobin A1c is less than 6.5. I will also check a urine microalbumin along with a BMP to monitor his kidney function. Patient recently had liver function tests and a fasting lipid panel obtained in June which were excellent with an LDL well below his goal of 70. Patient's blood pressure today is elevated but I believe this may be anxiety related. I will check his renal function. If his  renal function can tolerate it, I would like to start the patient on amlodipine for his hypertension.  12/12/15 Here for follow up.  At last visit, HgA1c was elevated at 8.6 and I recommend Januvia 100 mg poqday.  The patient's blood pressure is again elevated today at 160/100. I rechecked this myself in his right arm and confirm this value.  He denies any chest pain or shortness of breath or dyspnea on exertion. He has been taking Januvia. However he admits that his diet has slipped recently with his wife's birthday as well as Thanksgiving. He denies any hypoglycemic episodes. He is due for his flu shot. Past Medical History  Diagnosis Date  . Ulcerative colitis     s/p colectomy ~ 2005 at [facility]  . Renal stones     uric acid stones  . Chronic kidney disease   . Hypertension   . Hyperlipidemia   . Rosacea   . Insomnia   . Hiatal hernia   . Arthritis     Knee OA- per [facility]- injected prev by ortho  . Coronary artery disease     a. 10/1998 Cath: LM 30d, LAD 60m OM 50, RCA 100 w/ L->R collats, nl LV ->Med Rx;  b. 07/2010 MV: no ischemia.  .[name] KitchenB12 deficiency   . Osteoarthritis   . Type II diabetes mellitus ([place]   . Myocardial infarction ([facility]    "heart occasion" (03/15/2014)  . Pneumonia 1970's  .  Anemia   . Deep venous thrombosis (HCC)     left lower extremity  . GI bleed   . Colon polyps   . Duodenal ulcer   . Reflux esophagitis   . Duodenal ulcer    Past Surgical History  Procedure Laterality Date  . Subtotal colectomy  2005    with J pouch creation at [facility]  . Knee arthroscopy Bilateral 1990's  . Hernia repair Right ~ 2006    "abdomen; was as a result of a surgery"  . Cardiac catheterization  11/15/1998    30% distal left main, 75% mid LAD, 50% OM and total RCA with left to right collateral and normal LV function.  . Esophagogastroduodenoscopy N/A 03/16/2014    Procedure: ESOPHAGOGASTRODUODENOSCOPY (EGD);  Surgeon: Jerene Bears, MD;  Location: [facility] ENDOSCOPY;   Service: Endoscopy;  Laterality: N/A;  . Flexible sigmoidoscopy N/A 03/16/2014    Procedure: FLEXIBLE SIGMOIDOSCOPY;  Surgeon: Jerene Bears, MD;  Location: [facility] ENDOSCOPY;  Service: Endoscopy;  Laterality: N/A;  . US echocardiography  11/16/2007    mild concentric LVH,impaired LV relaxagtion,boderline RV enlargement,nodular thickening of the noncoronary cusup  . Nm myocar perf wall motion  08/06/2010    normal  . Colon surgery     Current Outpatient Prescriptions on File Prior to Visit  Medication Sig Dispense Refill  . aspirin EC 81 MG tablet Take 1 tablet (81 mg total) by mouth daily. 90 tablet 3  . cyanocobalamin (,VITAMIN B-12,) 1000 MCG/ML injection INJECT 1 MILLILITER INTO THE MUSCLE EVERY 30 DAYS 12 mL 11  . glipiZIDE (GLUCOTROL XL) 10 MG 24 hr tablet TAKE 1 TABLET BY MOUTH ONCE A DAY 30 tablet 2  . isosorbide mononitrate (IMDUR) 60 MG 24 hr tablet Take 1 tablet (60 mg total) by mouth daily. 90 tablet 2  . lisinopril-hydrochlorothiazide (PRINZIDE,ZESTORETIC) 20-12.5 MG tablet Take 1 tablet by mouth daily. 90 tablet 1  . lovastatin (MEVACOR) 10 MG tablet Take 1 tablet (10 mg total) by mouth at bedtime. 30 tablet 5  . metFORMIN (GLUCOPHAGE) 1000 MG tablet Take 1 tablet (1,000 mg total) by mouth 2 (two) times daily.    . metoprolol succinate (TOPROL-XL) 50 MG 24 hr tablet Take 2 tablets (100 mg total) by mouth daily. Take 1 1/2 tablets (75 mg) By Mouth Daily. Take with or immediately following a meal. 45 tablet 5  . minocycline (MINOCIN,DYNACIN) 50 MG capsule Take 1 capsule by mouth daily.    . pantoprazole (PROTONIX) 40 MG tablet Take 1 tablet (40 mg total) by mouth daily. 30 tablet 4  . sitaGLIPtin (JANUVIA) 100 MG tablet Take 1 tablet (100 mg total) by mouth daily. 30 tablet 3  . tiZANidine (ZANAFLEX) 4 MG tablet TAKE 1 TABLET BY MOUTH EVERY NIGHT AT BEDTIME AS NEEDED 30 tablet 0  . traMADol (ULTRAM) 50 MG tablet Take 2 tablets (100 mg total) by mouth 3 (three) times daily. 180 tablet 0  .  zolpidem (AMBIEN) 10 MG tablet TAKE 1 TABLET BY MOUTH EVERY NIGHT AT BEDTIME AS NEEDED FOR SLEEP 30 tablet 2   No current facility-administered medications on file prior to visit.   No Known Allergies Social History   Social History  . Marital Status: Married    Spouse Name: N/A  . Number of Children: 2  . Years of Education: N/A   Occupational History  . retired    Social History Main Topics  . Smoking status: Former Smoker -- 2.00 packs/day for 10 years  Types: Cigarettes    Quit date: 12/28/1974  . Smokeless tobacco: Never Used  . Alcohol Use: 0.0 oz/week    0 Standard drinks or equivalent per week     Comment: 03/15/2014 "6 pack beer or less/yr"  . Drug Use: No  . Sexual Activity: Not Currently   Other Topics Concern  . Not on file   Social History Narrative   Married 1967   Retired from [employer] work   Lives in [place] with his wife.   Family History  Problem Relation Age of Onset  . Heart disease Mother   . Diabetes Mother   . Arthritis Mother   . Hypertension Mother   . COPD Father   . Emphysema Father   . Asthma Father   . Colon cancer Neg Hx   . Prostate cancer Neg Hx   . Colon polyps Brother         Review of Systems  All other systems reviewed and are negative.      Objective:   Physical Exam  Constitutional: He appears well-developed and well-nourished.  Neck: Neck supple.  Cardiovascular: Normal rate, regular rhythm, normal heart sounds and intact distal pulses.  Exam reveals no gallop and no friction rub.   No murmur heard. Pulmonary/Chest: Effort normal and breath sounds normal. No respiratory distress. He has no wheezes. He has no rales. He exhibits no tenderness.  Abdominal: Soft. Bowel sounds are normal. He exhibits no distension and no mass. There is no tenderness. There is no rebound and no guarding.  Musculoskeletal: He exhibits no edema.  Lymphadenopathy:    He has no cervical adenopathy.  Skin: No rash noted. No erythema.   Vitals reviewed.         Assessment & Plan:  Diabetes mellitus type II, non insulin dependent (HCC)  ASCVD (arteriosclerotic cardiovascular disease)  Benign essential HTN  Blood pressure is too high. Begin amlodipine 10 mg by mouth daily. I will recheck a hemoglobin A1c. Goal hemoglobin A1c is less than 7. I will also check a fasting lipid panel. Goal fasting lipid panel LDL is less than 70 given his history of coronary artery disease. The patient received his flu shot today

## 2015-12-13 LAB — HEPATITIS C ANTIBODY: HCV Ab: NEGATIVE

## 2015-12-14 LAB — LIPID PANEL
CHOLESTEROL: 128 mg/dL (ref 125–200)
HDL: 40 mg/dL (ref >=40)
LDL Cholesterol: 50 mg/dL (ref <130)
TRIGLYCERIDES: 191 mg/dL — AB (ref <150)
Total CHOL/HDL Ratio: 3.2 Ratio (ref <=5.0)
VLDL: 38 mg/dL — ABNORMAL HIGH (ref <30)

## 2015-12-14 LAB — COMPLETE METABOLIC PANEL WITH GFR
ALBUMIN: 4 g/dL (ref 3.6–5.1)
ALK PHOS: 39 U/L — AB (ref 40–115)
ALT: 31 U/L (ref 9–46)
AST: 37 U/L — ABNORMAL HIGH (ref 10–35)
BILIRUBIN TOTAL: 0.5 mg/dL (ref 0.2–1.2)
BUN: 16 mg/dL (ref 7–25)
CALCIUM: 9.4 mg/dL (ref 8.6–10.3)
CO2: 28 mmol/L (ref 20–31)
CREATININE: 1.11 mg/dL (ref 0.70–1.18)
Chloride: 101 mmol/L (ref 98–110)
GFR, EST AFRICAN AMERICAN: 77 mL/min (ref >=60)
GFR, EST NON AFRICAN AMERICAN: 67 mL/min (ref >=60)
Glucose, Bld: 91 mg/dL (ref 70–99)
Potassium: 3.9 mmol/L (ref 3.5–5.3)
Sodium: 140 mmol/L (ref 135–146)
TOTAL PROTEIN: 6.9 g/dL (ref 6.1–8.1)

## 2015-12-14 LAB — HEMOGLOBIN A1C
Hgb A1c MFr Bld: 7.7 % — ABNORMAL HIGH (ref <5.7)
MEAN PLASMA GLUCOSE: 174 mg/dL — AB (ref <117)

## 2015-12-17 ENCOUNTER — Other Ambulatory Visit: Payer: Self-pay | Admitting: Family Medicine

## 2015-12-26 ENCOUNTER — Other Ambulatory Visit: Payer: Self-pay | Admitting: Family Medicine

## 2015-12-26 NOTE — Telephone Encounter (Signed)
ok 

## 2015-12-26 NOTE — Telephone Encounter (Signed)
Ok to refill 

## 2015-12-26 NOTE — Telephone Encounter (Signed)
?   OK to Refill  

## 2015-12-31 ENCOUNTER — Other Ambulatory Visit: Payer: Self-pay | Admitting: Family Medicine

## 2015-12-31 NOTE — Telephone Encounter (Signed)
Refill filled per protocol.

## 2016-01-15 ENCOUNTER — Other Ambulatory Visit: Payer: Self-pay | Admitting: Family Medicine

## 2016-01-15 NOTE — Telephone Encounter (Signed)
Ok to refill??  Last office visit 12/12/2015.  Last refill 08/08/2015.

## 2016-01-16 NOTE — Telephone Encounter (Signed)
ok 

## 2016-01-16 NOTE — Telephone Encounter (Signed)
Medication called to pharmacy. 

## 2016-01-24 ENCOUNTER — Other Ambulatory Visit: Payer: Self-pay | Admitting: Family Medicine

## 2016-01-24 NOTE — Telephone Encounter (Signed)
?   OK to Refill  

## 2016-01-24 NOTE — Telephone Encounter (Signed)
ok 

## 2016-01-24 NOTE — Telephone Encounter (Signed)
Medication called to pharmacy. 

## 2016-02-10 ENCOUNTER — Telehealth: Payer: Self-pay | Admitting: Family Medicine

## 2016-02-10 MED ORDER — TAMSULOSIN HCL 0.4 MG PO CAPS: 0.4000 mg | ORAL_CAPSULE | Freq: Every day | ORAL | Status: DC

## 2016-02-10 MED ORDER — OXYCODONE-ACETAMINOPHEN 5-325 MG PO TABS: ORAL_TABLET | ORAL | Status: DC

## 2016-02-10 NOTE — Telephone Encounter (Signed)
Pt called and states that he is having a kidney stones and would like to have some pain medication. Per [facility] ok for Percocet and could use flomax as well. RX for both printed and left up front and pt aware.

## 2016-02-25 ENCOUNTER — Other Ambulatory Visit: Payer: Self-pay | Admitting: Family Medicine

## 2016-02-25 NOTE — Telephone Encounter (Signed)
Medications called to pharmacy.

## 2016-02-25 NOTE — Telephone Encounter (Signed)
ok 

## 2016-02-25 NOTE — Telephone Encounter (Signed)
Ok to refill??  Last office visit 12/12/2015.  Last refill Ambien 01/24/2016.  Last refill Tramadol 01/16/2016.

## 2016-03-05 ENCOUNTER — Other Ambulatory Visit: Payer: Self-pay | Admitting: *Deleted

## 2016-03-17 ENCOUNTER — Encounter: Payer: Self-pay | Admitting: Family Medicine

## 2016-03-17 ENCOUNTER — Other Ambulatory Visit: Payer: Self-pay | Admitting: Family Medicine

## 2016-03-17 NOTE — Telephone Encounter (Signed)
Medication refill for one time only.  Patient needs to be seen.  Letter sent for patient to call and schedule 

## 2016-03-25 ENCOUNTER — Other Ambulatory Visit: Payer: Self-pay | Admitting: Family Medicine

## 2016-03-25 NOTE — Telephone Encounter (Signed)
Ok to refill??  Last office visit 12/12/2015.  Last refill Ambien 02/25/2016.  Last refill Zanaflex 12/26/2015, #2 refills.

## 2016-03-26 NOTE — Telephone Encounter (Signed)
Medication called to pharmacy. 

## 2016-03-26 NOTE — Telephone Encounter (Signed)
Ok

## 2016-03-30 ENCOUNTER — Other Ambulatory Visit: Payer: Self-pay | Admitting: Family Medicine

## 2016-03-30 ENCOUNTER — Other Ambulatory Visit: Payer: PPO

## 2016-03-30 ENCOUNTER — Ambulatory Visit (INDEPENDENT_AMBULATORY_CARE_PROVIDER_SITE_OTHER): Payer: PPO

## 2016-03-30 DIAGNOSIS — D519 Vitamin B12 deficiency anemia, unspecified: Secondary | ICD-10-CM

## 2016-03-30 DIAGNOSIS — Z79899 Other long term (current) drug therapy: Secondary | ICD-10-CM | POA: Diagnosis not present

## 2016-03-30 DIAGNOSIS — E1149 Type 2 diabetes mellitus with other diabetic neurological complication: Secondary | ICD-10-CM | POA: Diagnosis not present

## 2016-03-30 DIAGNOSIS — E1140 Type 2 diabetes mellitus with diabetic neuropathy, unspecified: Secondary | ICD-10-CM

## 2016-03-30 DIAGNOSIS — E7800 Pure hypercholesterolemia, unspecified: Secondary | ICD-10-CM

## 2016-03-30 DIAGNOSIS — I10 Essential (primary) hypertension: Secondary | ICD-10-CM

## 2016-03-31 LAB — COMPLETE METABOLIC PANEL WITH GFR
ALT: 33 U/L (ref 9–46)
AST: 37 U/L — ABNORMAL HIGH (ref 10–35)
Albumin: 4 g/dL (ref 3.6–5.1)
Alkaline Phosphatase: 57 U/L (ref 40–115)
BILIRUBIN TOTAL: 0.4 mg/dL (ref 0.2–1.2)
BUN: 18 mg/dL (ref 7–25)
CO2: 27 mmol/L (ref 20–31)
CREATININE: 1.12 mg/dL (ref 0.70–1.18)
Calcium: 9.6 mg/dL (ref 8.6–10.3)
Chloride: 102 mmol/L (ref 98–110)
GFR, EST AFRICAN AMERICAN: 76 mL/min (ref >=60)
GFR, Est Non African American: 66 mL/min (ref >=60)
GLUCOSE: 142 mg/dL — AB (ref 70–99)
Potassium: 3.8 mmol/L (ref 3.5–5.3)
SODIUM: 141 mmol/L (ref 135–146)
TOTAL PROTEIN: 7.1 g/dL (ref 6.1–8.1)

## 2016-03-31 LAB — MICROALBUMIN / CREATININE URINE RATIO
CREATININE, URINE: 169 mg/dL (ref 20–370)
MICROALB UR: 9.2 mg/dL
Microalb Creat Ratio: 54 mcg/mg creat — ABNORMAL HIGH (ref <30)

## 2016-03-31 LAB — CBC WITH DIFFERENTIAL/PLATELET
BASOS ABS: 0 {cells}/uL (ref 0–200)
Basophils Relative: 0 %
EOS ABS: 114 {cells}/uL (ref 15–500)
EOS PCT: 2 %
HCT: 44.5 % (ref 38.5–50.0)
HEMOGLOBIN: 14.5 g/dL (ref 13.0–17.0)
Lymphocytes Relative: 32 %
Lymphs Abs: 1824 cells/uL (ref 850–3900)
MCH: 28.9 pg (ref 27.0–33.0)
MCHC: 32.6 g/dL (ref 32.0–36.0)
MCV: 88.8 fL (ref 80.0–100.0)
MONOS PCT: 9 %
MPV: 11.1 fL (ref 7.5–12.5)
Monocytes Absolute: 513 cells/uL (ref 200–950)
NEUTROS PCT: 57 %
Neutro Abs: 3249 cells/uL (ref 1500–7800)
Platelets: 140 10*3/uL (ref 140–400)
RBC: 5.01 MIL/uL (ref 4.20–5.80)
RDW: 15.5 % — ABNORMAL HIGH (ref 11.0–15.0)
WBC: 5.7 10*3/uL (ref 3.8–10.8)

## 2016-03-31 LAB — IRON AND TIBC
%SAT: 19 % (ref 15–60)
IRON: 70 ug/dL (ref 50–180)
TIBC: 360 ug/dL (ref 250–425)
UIBC: 290 ug/dL (ref 125–400)

## 2016-03-31 LAB — FERRITIN: FERRITIN: 67 ng/mL (ref 20–380)

## 2016-03-31 LAB — LIPID PANEL
Cholesterol: 163 mg/dL (ref 125–200)
HDL: 35 mg/dL — ABNORMAL LOW (ref >=40)
LDL CALC: 66 mg/dL (ref <130)
Total CHOL/HDL Ratio: 4.7 Ratio (ref <=5.0)
Triglycerides: 312 mg/dL — ABNORMAL HIGH (ref <150)
VLDL: 62 mg/dL — ABNORMAL HIGH (ref <30)

## 2016-03-31 LAB — HEMOGLOBIN A1C
HEMOGLOBIN A1C: 8.5 % — AB (ref <5.7)
MEAN PLASMA GLUCOSE: 197 mg/dL

## 2016-04-02 ENCOUNTER — Telehealth: Payer: Self-pay

## 2016-04-02 ENCOUNTER — Ambulatory Visit: Payer: PPO | Admitting: Family Medicine

## 2016-04-02 ENCOUNTER — Encounter: Payer: Self-pay | Admitting: Family Medicine

## 2016-04-02 ENCOUNTER — Ambulatory Visit (INDEPENDENT_AMBULATORY_CARE_PROVIDER_SITE_OTHER): Payer: PPO | Admitting: Family Medicine

## 2016-04-02 VITALS — BP 118/72 | HR 62 | Temp 98.4°F | Resp 16 | Ht 66.75 in | Wt 212.0 lb

## 2016-04-02 DIAGNOSIS — I10 Essential (primary) hypertension: Secondary | ICD-10-CM

## 2016-04-02 DIAGNOSIS — E119 Type 2 diabetes mellitus without complications: Secondary | ICD-10-CM | POA: Diagnosis not present

## 2016-04-02 DIAGNOSIS — I2510 Atherosclerotic heart disease of native coronary artery without angina pectoris: Secondary | ICD-10-CM | POA: Diagnosis not present

## 2016-04-02 DIAGNOSIS — D509 Iron deficiency anemia, unspecified: Secondary | ICD-10-CM

## 2016-04-02 MED ORDER — EMPAGLIFLOZIN-LINAGLIPTIN 25-5 MG PO TABS: 1.0000 | ORAL_TABLET | Freq: Every day | ORAL | Status: DC

## 2016-04-02 NOTE — Progress Notes (Signed)
Subjective:    Patient ID: Manuel Grant, male    DOB: 1946/04/02, 70 y.o.   MRN: [id number]  HPI 08/08/15 Patient is a 70 year old white male with a history of ulcerative colitis status post colectomy. Recently the patient had profound hemorrhagic anemia secondary to an ulcer in both his stomach and small intestine. He is currently on a proton pump inhibitor. However he also has to take aspirin due to his history of coronary artery disease. At present they're managing his coronary artery disease medically. He denies any angina, chest pain, shortness of breath, dyspnea on exertion. He also has a history of diabetes mellitus type 2. He is overdue to recheck his hemoglobin A1c along with urine microalbumin. He is also due to check a PSA. Patient also has a history of severe cervical degenerative disc disease. He is currently taking 100 mg try and I'll every 8 hours. I personally reviewed the x-rays of his neck from April 2015 which reveals degenerative disc disease primarily at C6-C7 but also present at C5 and C6. He denies any neuropathy in the arms or weakness in the arms. Colonoscopy is up-to-date as was performed this year. Immunizations are up-to-date except Prevnar 13.  At that time, my plan was:  for the patient's degenerative disc disease in his cervical spine, I will start the patient on Ultram 100 mg by mouth every 8 hours. I will give him 180 tablets of 50 mg tramadol every month. Due to his history of an upper GI bleed I recommended that he not take NSAIDs ever again. I will also check a hemoglobin A1c. Goal hemoglobin A1c is less than 6.5. I will also check a urine microalbumin along with a BMP to monitor his kidney function. Patient recently had liver function tests and a fasting lipid panel obtained in June which were excellent with an LDL well below his goal of 70. Patient's blood pressure today is elevated but I believe this may be anxiety related. I will check his renal function. If his  renal function can tolerate it, I would like to start the patient on amlodipine for his hypertension.  12/12/15 Here for follow up.  At last visit, HgA1c was elevated at 8.6 and I recommend Januvia 100 mg poqday.  The patient's blood pressure is again elevated today at 160/100. I rechecked this myself in his right arm and confirm this value.  He denies any chest pain or shortness of breath or dyspnea on exertion. He has been taking Januvia. However he admits that his diet has slipped recently with his wife's birthday as well as Thanksgiving. He denies any hypoglycemic episodes. He is due for his flu shot.  At that time, my plan was: Blood pressure is too high. Begin amlodipine 10 mg by mouth daily. I will recheck a hemoglobin A1c. Goal hemoglobin A1c is less than 7. I will also check a fasting lipid panel. Goal fasting lipid panel LDL is less than 70 given his history of coronary artery disease. The patient received his flu shot today  04/02/16 Patient is here today for follow-up. He states he is tried hard with diet and exercise and weight loss. He denies any polyuria, polydipsia, or blurred vision. Unfortunately his hemoglobin A1c has risen. Past medical history is significant for ASCVD/myocardial infarction. Goal hemoglobin A1c will be less than 7. Goal LDL cholesterol is less than 70. His blood pressure today is much better. He denies any chest pain shortness of breath or dyspnea on exertion. He denies  any myalgias or right upper quadrant pain. His most recent lab work as listed below: Lab on 03/30/2016  Component Date Value Ref Range Status  . Sodium 03/30/2016 141  135 - 146 mmol/L Final  . Potassium 03/30/2016 3.8  3.5 - 5.3 mmol/L Final  . Chloride 03/30/2016 102  98 - 110 mmol/L Final  . CO2 03/30/2016 27  20 - 31 mmol/L Final  . Glucose, Bld 03/30/2016 142* 70 - 99 mg/dL Final  . BUN 03/30/2016 18  7 - 25 mg/dL Final  . Creat 03/30/2016 1.12  0.70 - 1.18 mg/dL Final  . Total Bilirubin  03/30/2016 0.4  0.2 - 1.2 mg/dL Final  . Alkaline Phosphatase 03/30/2016 57  40 - 115 U/L Final  . AST 03/30/2016 37* 10 - 35 U/L Final  . ALT 03/30/2016 33  9 - 46 U/L Final  . Total Protein 03/30/2016 7.1  6.1 - 8.1 g/dL Final  . Albumin 03/30/2016 4.0  3.6 - 5.1 g/dL Final  . Calcium 03/30/2016 9.6  8.6 - 10.3 mg/dL Final  . GFR, Est African American 03/30/2016 76  >=60 mL/min Final  . GFR, Est Non African American 03/30/2016 66  >=60 mL/min Final   Comment:   The estimated GFR is a calculation valid for adults (>=60 years old) that uses the CKD-EPI algorithm to adjust for age and sex. It is   not to be used for children, pregnant women, hospitalized patients,    patients on dialysis, or with rapidly changing kidney function. According to the NKDEP, eGFR >89 is normal, 60-89 shows mild impairment, 30-59 shows moderate impairment, 15-29 shows severe impairment and <15 is ESRD.     [name] Cholesterol 03/30/2016 163  125 - 200 mg/dL Final  . Triglycerides 03/30/2016 312* <150 mg/dL Final  . HDL 03/30/2016 35* >=40 mg/dL Final  . Total CHOL/HDL Ratio 03/30/2016 4.7  <=5.0 Ratio Final  . VLDL 03/30/2016 62* <30 mg/dL Final  . LDL Cholesterol 03/30/2016 66  <130 mg/dL Final   Comment:   Total Cholesterol/HDL Ratio:CHD Risk                        Coronary Heart Disease Risk Table                                        Men       Women          1/2 Average Risk              3.4        3.3              Average Risk              5.0        4.4           2X Average Risk              9.6        7.1           3X Average Risk             23.4       11.0 Use the calculated Patient Ratio above and the CHD Risk table  to determine the patient's CHD Risk.   . Hgb A1c MFr Bld 03/30/2016 8.5* <5.7 % Final   Comment:  For someone without known diabetes, a hemoglobin A1c value of 6.5% or greater indicates that they may have diabetes and this should be confirmed with a follow-up test.   For someone  with known diabetes, a value <7% indicates that their diabetes is well controlled and a value greater than or equal to 7% indicates suboptimal control. A1c targets should be individualized based on duration of diabetes, age, comorbid conditions, and other considerations.   Currently, no consensus exists for use of hemoglobin A1c for diagnosis of diabetes for children.     . Mean Plasma Glucose 03/30/2016 197   Final  . Ferritin 03/30/2016 67  20 - 380 ng/mL Final  . WBC 03/30/2016 5.7  3.8 - 10.8 K/uL Final  . RBC 03/30/2016 5.01  4.20 - 5.80 MIL/uL Final  . Hemoglobin 03/30/2016 14.5  13.0 - 17.0 g/dL Final  . HCT 03/30/2016 44.5  38.5 - 50.0 % Final  . MCV 03/30/2016 88.8  80.0 - 100.0 fL Final  . MCH 03/30/2016 28.9  27.0 - 33.0 pg Final  . MCHC 03/30/2016 32.6  32.0 - 36.0 g/dL Final  . RDW 03/30/2016 15.5* 11.0 - 15.0 % Final  . Platelets 03/30/2016 140  140 - 400 K/uL Final  . MPV 03/30/2016 11.1  7.5 - 12.5 fL Final  . Neutro Abs 03/30/2016 3249  1500 - 7800 cells/uL Final  . Lymphs Abs 03/30/2016 1824  850 - 3900 cells/uL Final  . Monocytes Absolute 03/30/2016 513  200 - 950 cells/uL Final  . Eosinophils Absolute 03/30/2016 114  15 - 500 cells/uL Final  . Basophils Absolute 03/30/2016 0  0 - 200 cells/uL Final  . Neutrophils Relative % 03/30/2016 57   Final  . Lymphocytes Relative 03/30/2016 32   Final  . Monocytes Relative 03/30/2016 9   Final  . Eosinophils Relative 03/30/2016 2   Final  . Basophils Relative 03/30/2016 0   Final  . Smear Review 03/30/2016 Criteria for review not met   Final   ** Please note change in unit of measure and reference range(s). **  . Creatinine, Urine 03/30/2016 169  20 - 370 mg/dL Final  . Microalb, Ur 03/30/2016 9.2  Not estab mg/dL Final  . Microalb Creat Ratio 03/30/2016 54* <30 mcg/mg creat Final   Comment: The ADA has defined abnormalities in albumin excretion as follows:           Category           Result                             (mcg/mg creatinine)                 Normal:    <30       Microalbuminuria:    30 - 299   Clinical albuminuria:    > or = 300   The ADA recommends that at least two of three specimens collected within a 3 - 6 month period be abnormal before considering a patient to be within a diagnostic category.     . Iron 03/30/2016 70  50 - 180 ug/dL Final  . UIBC 03/30/2016 290  125 - 400 ug/dL Final  . TIBC 03/30/2016 360  250 - 425 ug/dL Final  . %SAT 03/30/2016 19  15 - 60 % Final    Past Medical History  Diagnosis Date  . Ulcerative colitis     s/p colectomy ~  2005 at [facility]  . Renal stones     uric acid stones  . Chronic kidney disease   . Hypertension   . Hyperlipidemia   . Rosacea   . Insomnia   . Hiatal hernia   . Arthritis     Knee OA- per [facility]- injected prev by ortho  . Coronary artery disease     a. 10/1998 Cath: LM 30d, LAD 3m OM 50, RCA 100 w/ L->R collats, nl LV ->Med Rx;  b. 07/2010 MV: no ischemia.  .[name] KitchenB12 deficiency   . Osteoarthritis   . [place]   . My[facility] infarction ([facility] 1999    "heart occasion" (03/15/2014)  . Pneumonia 1970's  . Anemia   . Deep venous thrombosis (HCC)     left lower extremity  . GI bleed   . Colon polyps   . Duodenal ulcer   . Reflux esophagitis   . Duodenal ulcer    Past Surgical History  Procedure Laterality Date  . [facility]total [facility]  2005    [facility] . Knee arthroscopy Bilateral 1990's  . Hernia repair Right ~ 2006    "abdomen; was as a result of a surgery"  . Cardiac catheterization  11/15/1998    30% distal left main, 75% mid LAD, 50% OM and total RCA with left to right collateral and normal LV function.  . Esophagogastroduodenoscopy N/A 03/16/2014    Procedure: ESOPHAGOGASTRODUODENOSCOPY (EGD);  Surgeon: JJerene Bears MD;  [facility];  Service: Endoscopy;  Laterality: N/A;  . Flexible sigmoidoscopy N/A 03/16/2014    Procedure: FLEXIBLE SIGMOIDOSCOPY;   Surgeon: JJerene Bears MD;  [facility];  Service: Endoscopy;  Laterality: N/A;  . UKoreaechocardiography  11/16/2007    mild concentric LVH,impaired LV relaxagtion,boderline RV enlargement,nodular thickening of the noncoronary cusup  . Nm myocar perf wall motion  08/06/2010    normal  . Colon surgery     Current Outpatient Prescriptions on File Prior to Visit  Medication Sig Dispense Refill  . amLODipine (NORVASC) 10 MG tablet Take 1 tablet (10 mg total) by mouth daily. 90 tablet 3  . aspirin EC 81 MG tablet Take 1 tablet (81 mg total) by mouth daily. 90 tablet 3  . cyanocobalamin (,VITAMIN B-12,) 1000 MCG/ML injection INJECT 1 MILLILITER INTO THE MUSCLE EVERY 30 DAYS 12 mL 11  . glipiZIDE (GLUCOTROL XL) 10 MG 24 hr tablet TAKE 1 TABLET BY MOUTH ONCE A DAY 30 tablet 3  . isosorbide mononitrate (IMDUR) 60 MG 24 hr tablet Take 1 tablet (60 mg total) by mouth daily. 90 tablet 2  . JANUVIA 100 MG tablet TAKE 1 TABLET BY MOUTH ONCE A DAY 30 tablet 3  . lisinopril-hydrochlorothiazide (PRINZIDE,ZESTORETIC) 20-12.5 MG tablet Take 1 tablet by mouth daily. 90 tablet 1  . lovastatin (MEVACOR) 10 MG tablet Take 1 tablet (10 mg total) by mouth at bedtime. 30 tablet 5  . metFORMIN (GLUCOPHAGE) 1000 MG tablet TAKE 1 TABLET BY MOUTH TWICE A DAY WITH A MEAL 60 tablet 0  . metoprolol succinate (TOPROL-XL) 50 MG 24 hr tablet Take 2 tablets (100 mg total) by mouth daily. Take 1 1/2 tablets (75 mg) By Mouth Daily. Take with or immediately following a meal. 45 tablet 5  . minocycline (MINOCIN,DYNACIN) 50 MG capsule Take 1 capsule by mouth daily.    .[name] KitchenoxyCODONE-acetaminophen (ROXICET) 5-325 MG tablet 1-2 tab po q 6 hours 30 tablet 0  .  pantoprazole (PROTONIX) 40 MG tablet Take 1 tablet (40 mg total) by mouth daily. 30 tablet 4  . tamsulosin (FLOMAX) 0.4 MG CAPS capsule Take 1 capsule (0.4 mg total) by mouth daily. 30 capsule 0  . tiZANidine (ZANAFLEX) 4 MG tablet TAKE 1 TABLET BY MOUTH EVERY NIGHT AT BEDTIME  AS NEEDED 30 tablet 2  . traMADol (ULTRAM) 50 MG tablet TAKE 1 OR 2 TABLETS BY MOUTH 3 TIMES A DAY FOR RHEUMATOID ARTHRITIS 180 tablet 0  . zolpidem (AMBIEN) 10 MG tablet TAKE 1 TABLET BY MOUTH DAILY AT BEDTIME AS NEEDED FOR SLEEP 30 tablet 0   No current facility-administered medications on file prior to visit.   No Known Allergies Social History   Social History  . Marital Status: Married    Spouse Name: N/A  . Number of Children: 2  . Years of Education: N/A   Occupational History  . retired    Social History Main Topics  . Smoking status: Former Smoker -- 2.00 packs/day for 10 years    Types: Cigarettes    Quit date: 12/28/1974  . Smokeless tobacco: Never Used  . Alcohol Use: 0.0 oz/week    0 Standard drinks or equivalent per week     Comment: 03/15/2014 "6 pack beer or less/yr"  . Drug Use: No  . Sexual Activity: Not Currently   Other Topics Concern  . Not on file   Social History Narrative   Married 1967   Retired from [employer] work   Lives in [place] with his wife.   Family History  Problem Relation Age of Onset  . Heart disease Mother   . Diabetes Mother   . Arthritis Mother   . Hypertension Mother   . COPD Father   . Emphysema Father   . Asthma Father   . Colon cancer Neg Hx   . Prostate cancer Neg Hx   . Colon polyps Brother         Review of Systems  All other systems reviewed and are negative.      Objective:   Physical Exam  Constitutional: He appears well-developed and well-nourished.  Neck: Neck supple.  Cardiovascular: Normal rate, regular rhythm, normal heart sounds and intact distal pulses.  Exam reveals no gallop and no friction rub.   No murmur heard. Pulmonary/Chest: Effort normal and breath sounds normal. No respiratory distress. He has no wheezes. He has no rales. He exhibits no tenderness.  Abdominal: Soft. Bowel sounds are normal. He exhibits no distension and no mass. There is no tenderness. There is no rebound and no  guarding.  Musculoskeletal: He exhibits no edema.  Lymphadenopathy:    He has no cervical adenopathy.  Skin: No rash noted. No erythema.  Vitals reviewed.         Assessment & Plan:  Diabetes mellitus type II, non insulin dependent (HCC) - Plan: Empagliflozin-Linagliptin (GLYXAMBI) 25-5 MG TABS  ASCVD (arteriosclerotic cardiovascular disease)  Benign essential HTN  Overall, I am very happy with his blood pressure as well as his LDL cholesterol. However his hemoglobin A1c shows poor glycemic control. I outlined several options for the patient. Ultimately he decided to discontinue Januvia and switch to [facility] 5/25 every day. Recheck hemoglobin A1c in 3 months. If this is too expensive, I will have the patient continue Januvia and start Lantus and titrate to a fasting blood sugar of less than 1:30 and a 2 hour postprandial sugar of less than 160.

## 2016-04-02 NOTE — Telephone Encounter (Signed)
Pt aware and order/reminder in epic.

## 2016-04-06 ENCOUNTER — Telehealth: Payer: Self-pay | Admitting: *Deleted

## 2016-04-06 NOTE — Telephone Encounter (Signed)
Received request from pharmacy for [place] on Glyxambi.   No other DDP 4/ SGLT2 combination on formulary. Patient has tried and failed glipizide, metformin and Januvia. Patient has Hx of MI, therefore Actos in not an acceptable therapy.  PA submitted.   Dx: E11.40.

## 2016-04-06 NOTE — Telephone Encounter (Signed)
Received PA determination.   PA approved 04/06/2016- 12/27/2016.  PA Case: [id number].  Pharmacy made aware.   Co-pay is $85.00.  MD please advise.

## 2016-04-07 ENCOUNTER — Other Ambulatory Visit: Payer: Self-pay | Admitting: Family Medicine

## 2016-04-07 NOTE — Telephone Encounter (Signed)
Medication called to pharmacy. 

## 2016-04-07 NOTE — Telephone Encounter (Signed)
Call placed to patient and patient made aware.   Patient declines insulin at this time and will pay for [facility].

## 2016-04-07 NOTE — Telephone Encounter (Signed)
Ok to refill??  Last office visit 04/02/2016.  Last refill 02/25/2016.

## 2016-04-07 NOTE — Telephone Encounter (Signed)
ok 

## 2016-04-07 NOTE — Telephone Encounter (Signed)
Patient will pay 85 dollars but will get to stop buying [place] so actually not paying as much.  Only other choice is insulin.

## 2016-04-23 ENCOUNTER — Other Ambulatory Visit: Payer: Self-pay | Admitting: Family Medicine

## 2016-04-23 NOTE — Telephone Encounter (Signed)
Ok to refill??  Last office visit 04/02/2016.  Last refill 03/26/2016.

## 2016-04-23 NOTE — Telephone Encounter (Signed)
ok 

## 2016-04-23 NOTE — Telephone Encounter (Signed)
Medication called to pharmacy. 

## 2016-05-13 DIAGNOSIS — H01009 Unspecified blepharitis unspecified eye, unspecified eyelid: Secondary | ICD-10-CM | POA: Diagnosis not present

## 2016-05-13 DIAGNOSIS — E119 Type 2 diabetes mellitus without complications: Secondary | ICD-10-CM | POA: Diagnosis not present

## 2016-05-13 DIAGNOSIS — H1859 Other hereditary corneal dystrophies: Secondary | ICD-10-CM | POA: Diagnosis not present

## 2016-05-13 DIAGNOSIS — H2513 Age-related nuclear cataract, bilateral: Secondary | ICD-10-CM | POA: Diagnosis not present

## 2016-05-13 LAB — HM DIABETES EYE EXAM

## 2016-05-18 ENCOUNTER — Other Ambulatory Visit: Payer: Self-pay | Admitting: Family Medicine

## 2016-05-18 NOTE — Telephone Encounter (Signed)
Ok to refill Tramadol??  Last office visit 04/02/2016.  Last refill 04/07/2016.

## 2016-05-18 NOTE — Telephone Encounter (Signed)
ok 

## 2016-05-18 NOTE — Telephone Encounter (Signed)
Medication called to pharmacy. 

## 2016-05-21 ENCOUNTER — Other Ambulatory Visit: Payer: Self-pay | Admitting: Family Medicine

## 2016-05-21 DIAGNOSIS — L821 Other seborrheic keratosis: Secondary | ICD-10-CM | POA: Diagnosis not present

## 2016-05-21 DIAGNOSIS — L578 Other skin changes due to chronic exposure to nonionizing radiation: Secondary | ICD-10-CM | POA: Diagnosis not present

## 2016-05-21 DIAGNOSIS — D485 Neoplasm of uncertain behavior of skin: Secondary | ICD-10-CM | POA: Diagnosis not present

## 2016-05-21 DIAGNOSIS — L570 Actinic keratosis: Secondary | ICD-10-CM | POA: Diagnosis not present

## 2016-05-21 DIAGNOSIS — Z1283 Encounter for screening for malignant neoplasm of skin: Secondary | ICD-10-CM | POA: Diagnosis not present

## 2016-05-21 DIAGNOSIS — L820 Inflamed seborrheic keratosis: Secondary | ICD-10-CM | POA: Diagnosis not present

## 2016-05-21 NOTE — Telephone Encounter (Signed)
Refill appropriate and filled per protocol. 

## 2016-05-26 ENCOUNTER — Other Ambulatory Visit: Payer: Self-pay | Admitting: Family Medicine

## 2016-05-26 ENCOUNTER — Ambulatory Visit (INDEPENDENT_AMBULATORY_CARE_PROVIDER_SITE_OTHER): Payer: PPO | Admitting: Family Medicine

## 2016-05-26 ENCOUNTER — Encounter: Payer: Self-pay | Admitting: Family Medicine

## 2016-05-26 VITALS — BP 112/80 | HR 96 | Temp 98.8°F | Resp 18 | Wt 214.0 lb

## 2016-05-26 DIAGNOSIS — N481 Balanitis: Secondary | ICD-10-CM

## 2016-05-26 MED ORDER — FLUCONAZOLE 150 MG PO TABS
150.0000 mg | ORAL_TABLET | ORAL | Status: DC
Start: 2016-05-26 — End: 2016-06-16

## 2016-05-26 MED ORDER — CEPHALEXIN 500 MG PO CAPS: 500.0000 mg | ORAL_CAPSULE | Freq: Three times a day (TID) | ORAL | Status: DC

## 2016-05-26 NOTE — Telephone Encounter (Signed)
Refill approved per provider and called in

## 2016-05-26 NOTE — [place] Notes (Signed)
Subjective:    Patient ID: Manuel Grant, male    DOB: Feb 28, 1946, 70 y.o.   MRN: [id number]  HPI  Patient is uncircumcised. Recently was started on Glyxambi.  About a week ago, developed erythema and swelling in the foreskin. Now the skin around the head of the penis is cracked red and painful. There is a milky white discharge coming from underneath the foreskin. We are unable to retract the foreskin due to pain. There is also some erythema tracking down the shaft of the penis on the skin. He denies any systemic symptoms Past Medical History  Diagnosis Date  . Ulcerative colitis     s/p colectomy ~ [facility] at [facility]  . Renal stones     uric acid stones  . Chronic kidney disease   . Hypertension   . Hyperlipidemia   . Rosacea   . Insomnia   . Hiatal hernia   . Arthritis     Knee OA- per [facility]- injected prev by ortho  . Coronary artery disease     a. 10/1998 Cath: LM 30d, LAD 67m OM 50, RCA 100 w/ L->R collats, nl LV ->Med Rx;  b. 07/2010 MV: no ischemia.  .[name] KitchenB12 deficiency   . Osteoarthritis   . [place]   . Myocardial infarction ([facility] 1999    "heart occasion" (03/15/2014)  . Pneumonia 1970's  . Anemia   . Deep venous thrombosis (HCC)     left lower extremity  . GI bleed   . Colon polyps   . Duodenal ulcer   . Reflux esophagitis   . Duodenal ulcer    Past Surgical History  Procedure Laterality Date  . Subtotal [facility]lectomy  2005    [facility] . Knee arthroscopy Bilateral 1990's  . Hernia repair Right ~ 2006    "abdomen; was as a result of a surgery"  . Cardiac catheterization  11/15/1998    30% distal left main, 75% mid LAD, 50% OM and total RCA with left to right collateral and normal LV function.  . Esophagogastroduodenoscopy N/A 03/16/2014    Procedure: ESOPHAGOGASTRODUODENOSCOPY (EGD);  Surgeon: JJerene Bears MD;  [facility];  Service: Endoscopy;  Laterality: N/A;  . Flexible sigmoidoscopy N/A 03/16/2014    Procedure: FLEXIBLE SIGMOIDOSCOPY;  Surgeon: JJerene Bears MD;  [facility];  Service: Endoscopy;  Laterality: N/A;  . UKoreaechocardiography  11/16/2007    mild concentric LVH,impaired LV relaxagtion,boderline RV enlargement,nodular thickening of the noncoronary cusup  . Nm myocar perf wall motion  08/06/2010    normal  . Colon surgery     Current Outpatient Prescriptions on File Prior to Visit  Medication Sig Dispense Refill  . amLODipine (NORVASC) 10 MG tablet Take 1 tablet (10 mg total) by mouth daily. 90 tablet 3  . aspirin EC 81 MG tablet Take 1 tablet (81 mg total) by mouth daily. 90 tablet 3  . cyanocobalamin (,VITAMIN B-12,) 1000 MCG/ML injection INJECT 1 MILLILITER INTO THE MUSCLE EVERY 30 DAYS 12 mL 11  . Empagliflozin-Linagliptin (GLYXAMBI) 25-5 MG TABS Take 1 tablet by mouth daily. 30 tablet 5  . glipiZIDE (GLUCOTROL XL) 10 MG 24 hr tablet TAKE 1 TABLET BY MOUTH ONCE A DAY 30 tablet 0  . isosorbide mononitrate (IMDUR) 60 MG 24 hr tablet Take 1 tablet (60 mg total) by mouth daily. 90 tablet 2  . JANUVIA 100 MG tablet TAKE 1 TABLET BY MOUTH ONCE A  DAY 30 tablet 3  . lisinopril-hydrochlorothiazide (PRINZIDE,ZESTORETIC) 20-12.5 MG tablet Take 1 tablet by mouth daily. 90 tablet 1  . lovastatin (MEVACOR) 10 MG tablet Take 1 tablet (10 mg total) by mouth at bedtime. 30 tablet 5  . metFORMIN (GLUCOPHAGE) 1000 MG tablet TAKE 1 TABLET TWICE A DAY WITH FOOD 60 tablet 3  . metoprolol succinate (TOPROL-XL) 50 MG 24 hr tablet Take 2 tablets (100 mg total) by mouth daily. Take 1 1/2 tablets (75 mg) By Mouth Daily. Take with or immediately following a meal. 45 tablet 5  . minocycline (MINOCIN,DYNACIN) 50 MG capsule Take 1 capsule by mouth daily.    . pantoprazole (PROTONIX) 40 MG tablet Take 1 tablet (40 mg total) by mouth daily. 30 tablet 4  . tiZANidine (ZANAFLEX) 4 MG tablet TAKE 1 TABLET BY MOUTH EVERY NIGHT AT BEDTIME AS NEEDED 30 tablet 2  . traMADol (ULTRAM) 50 MG tablet TAKE 1 OR  2 TABLETS BY MOUTH 3 TIMES A DAY AS NEEDED FOR ARTHRITIS PAIN 180 tablet 0  . zolpidem (AMBIEN) 10 MG tablet TAKE 1 TABLET BY MOUTH EVERY NIGHT AT BEDTIME AS NEEDED FOR INSOMNIA 30 tablet 0   No current facility-administered medications on file prior to visit.   No Known Allergies Social History   Social History  . Marital Status: Married    Spouse Name: N/A  . Number of Children: 2  . Years of Education: N/A   Occupational History  . retired    Social History Main Topics  . Smoking status: Former Smoker -- 2.00 packs/day for 10 years    Types: Cigarettes    Quit date: 12/28/1974  . Smokeless tobacco: Never Used  . Alcohol Use: 0.0 oz/week    0 Standard drinks or equivalent per week     Comment: 03/15/2014 "6 pack beer or less/yr"  . Drug Use: No  . Sexual Activity: Not Currently   Other Topics Concern  . Not on file   Social History Narrative   Married 1967   Retired from [employer] work   Lives in [place] with his wife.     Review of Systems  All other systems reviewed and are negative.      Objective:   Physical Exam  Cardiovascular: Normal rate and normal heart sounds.   Pulmonary/Chest: Effort normal and breath sounds normal.  Genitourinary: Penile erythema present.  Skin: Skin is warm. Rash noted. There is erythema.          Assessment & Plan:  Balanitis - Plan: cephALEXin (KEFLEX) 500 MG capsule, fluconazole (DIFLUCAN) 150 MG tablet  Patient distally had a candidal infection of the foreskin. However I'm concerned that he has also developed a secondary bacterial superinfection. Begin Diflucan 150 mg by mouth every other day for 2 weeks. He can apply Lotrimin cream to the affected skin as well as a lubricant as well as an antifungal. Treat the secondary superinfection with Keflex 500 mg by mouth 3 times a day for 1 week. Recheck in one week or sooner if worse. I would suggest switching away from [facility].

## 2016-05-27 ENCOUNTER — Encounter: Payer: Self-pay | Admitting: Cardiovascular Disease

## 2016-05-27 ENCOUNTER — Ambulatory Visit (INDEPENDENT_AMBULATORY_CARE_PROVIDER_SITE_OTHER): Payer: PPO | Admitting: Cardiovascular Disease

## 2016-05-27 VITALS — BP 108/74 | HR 85 | Ht 66.0 in | Wt 214.2 lb

## 2016-05-27 DIAGNOSIS — I2510 Atherosclerotic heart disease of native coronary artery without angina pectoris: Secondary | ICD-10-CM

## 2016-05-27 DIAGNOSIS — E7800 Pure hypercholesterolemia, unspecified: Secondary | ICD-10-CM

## 2016-05-27 DIAGNOSIS — I2583 Coronary atherosclerosis due to lipid rich plaque: Secondary | ICD-10-CM

## 2016-05-27 DIAGNOSIS — I10 Essential (primary) hypertension: Secondary | ICD-10-CM | POA: Diagnosis not present

## 2016-05-27 NOTE — Assessment & Plan Note (Signed)
History of chronic left common femoral and popliteal vein DVT by duplex ultrasound 02/09/15. His legs are fairly symmetric and asymptomatic.

## 2016-05-27 NOTE — Assessment & Plan Note (Signed)
History of hypertension blood pressure measured at 108/74. He is on amlodipine, lisinopril, hydrochlorothiazide and metoprolol. Continue current meds at current dosing

## 2016-05-27 NOTE — Assessment & Plan Note (Signed)
History of CAD status post cardiac catheterization performed by myself 11/15/98 revealing three-vessel disease with preserved LV function. Medical therapy was recommended at that time. He had a 30% distal left main, 75% mid LAD, 50% obtuse marginal branch stenosis and a total RCA with left-to-right collaterals. He denies chest pain or shortness of breath.

## 2016-05-27 NOTE — [place] Notes (Signed)
05/27/2016 [name]   Jun 01, 1946  [id number]  Primary Physician Odette Fraction, MD Primary Cardiologist: [name] MD Manuel Grant   HPI:  The patient is a very pleasant 70 year old mildly to moderately overweight married Caucasian male father of 65, grandfather to 3 grandchildren who I saw 05/15/15. He has a history of ischemic heart disease status post cardiac catheterization by myself November 15, 1998 revealing 3-vessel disease with preserved LV function. Medical therapy was recommended at that time. He had a 30% distal left main, a 75% mid LAD, a 50% OM and a total RCA with left-to-right collaterals and normal LV function. His other problems include hypertension and hyperlipidemia. He is totally asymptomatic. His last Myoview performed August 06, 2010 was nonischemic. Lipid profile performed 03/08/14 revealed a total cholesterol of 98, LDL 31 and HDL 41. The last saw him in October he developed progressive dyspnea on exertion and substernal chest pain. I plan to performing outpatient cardiac catheterization on him however his preprocedure labs revealed profound anemia requiring workup including endoscopy. He was placed on oral therapy for H. Pylori and transfused. His cardiac symptoms have resolved. Back in March he was diagnosed with a left lower extremity deep venous thrombosis involving his common femoral, popliteal and peroneal veins. He was placed on Xarelto oral anticoagulation at that time which resulted in clinical improvement. Followup his Dopplers in July revealed mild partial resolution and venous Dopplers once again performed 02/09/15 showed chronic DVT without acute changes. Since I saw him a year ago he's remained clinically stable denying chest pain or shortness of breath.   Current Outpatient Prescriptions  Medication Sig Dispense Refill  . amLODipine (NORVASC) 10 MG tablet Take 1 tablet (10 mg total) by mouth daily. 90 tablet 3  . aspirin EC 81 MG  tablet Take 1 tablet (81 mg total) by mouth daily. 90 tablet 3  . cephALEXin (KEFLEX) 500 MG capsule Take 1 capsule (500 mg total) by mouth 3 (three) times daily. 21 capsule 0  . cyanocobalamin (,VITAMIN B-12,) 1000 MCG/ML injection INJECT 1 MILLILITER INTO THE MUSCLE EVERY 30 DAYS 12 mL 11  . Empagliflozin-Linagliptin (GLYXAMBI) 25-5 MG TABS Take 1 tablet by mouth daily. 30 tablet 5  . fluconazole (DIFLUCAN) 150 MG tablet Take 1 tablet (150 mg total) by mouth every other day. 7 tablet 0  . glipiZIDE (GLUCOTROL XL) 10 MG 24 hr tablet TAKE 1 TABLET BY MOUTH ONCE A DAY 30 tablet 0  . isosorbide mononitrate (IMDUR) 60 MG 24 hr tablet Take 1 tablet (60 mg total) by mouth daily. 90 tablet 2  . JANUVIA 100 MG tablet TAKE 1 TABLET BY MOUTH ONCE A DAY 30 tablet 3  . lisinopril-hydrochlorothiazide (PRINZIDE,ZESTORETIC) 20-12.5 MG tablet Take 1 tablet by mouth daily. 90 tablet 1  . lovastatin (MEVACOR) 10 MG tablet Take 1 tablet (10 mg total) by mouth at bedtime. 30 tablet 5  . metFORMIN (GLUCOPHAGE) 1000 MG tablet TAKE 1 TABLET TWICE A DAY WITH FOOD 60 tablet 3  . metoprolol succinate (TOPROL-XL) 50 MG 24 hr tablet Take 2 tablets (100 mg total) by mouth daily. Take 1 1/2 tablets (75 mg) By Mouth Daily. Take with or immediately following a meal. 45 tablet 5  . minocycline (MINOCIN,DYNACIN) 50 MG capsule Take 1 capsule by mouth daily.    . pantoprazole (PROTONIX) 40 MG tablet Take 1 tablet (40 mg total) by mouth daily. 30 tablet 4  . tiZANidine (ZANAFLEX) 4 MG tablet TAKE 1 TABLET BY  MOUTH EVERY NIGHT AT BEDTIME AS NEEDED 30 tablet 2  . traMADol (ULTRAM) 50 MG tablet TAKE 1 OR 2 TABLETS BY MOUTH 3 TIMES A DAY AS NEEDED FOR ARTHRITIS PAIN 180 tablet 0  . zolpidem (AMBIEN) 10 MG tablet TAKE 1 TABLET BY MOUTH EVERY NIGHT AT BEDTIME AS NEEDED 30 tablet 2   No current facility-administered medications for this visit.    No Known Allergies  Social History   Social History  . Marital Status: Married     Spouse Name: N/A  . Number of Children: 2  . Years of Education: N/A   Occupational History  . retired    Social History Main Topics  . Smoking status: Former Smoker -- 2.00 packs/day for 10 years    Types: Cigarettes    Quit date: 12/28/1974  . Smokeless tobacco: Never Used  . Alcohol Use: 0.0 oz/week    0 Standard drinks or equivalent per week     Comment: 03/15/2014 "6 pack beer or less/yr"  . Drug Use: No  . Sexual Activity: Not Currently   Other Topics Concern  . Not on file   Social History Narrative   Married 1967   Retired from [employer] work   Lives in [place] with his wife.     Review of Systems: General: negative for chills, fever, night sweats or weight changes.  Cardiovascular: negative for chest pain, dyspnea on exertion, edema, orthopnea, palpitations, paroxysmal nocturnal dyspnea or shortness of breath Dermatological: negative for rash Respiratory: negative for cough or wheezing Urologic: negative for hematuria Abdominal: negative for nausea, vomiting, diarrhea, bright red blood per rectum, melena, or hematemesis Neurologic: negative for visual changes, syncope, or dizziness All other systems reviewed and are otherwise negative except as noted above.    Blood pressure 108/74, pulse 85, height 5' 6"  (1.676 m), weight 214 lb 3.2 oz (97.16 kg).  General appearance: alert and no distress Neck: no adenopathy, no carotid bruit, no JVD, supple, symmetrical, trachea midline and thyroid not enlarged, symmetric, no tenderness/mass/nodules Lungs: clear to auscultation bilaterally Heart: regular rate and rhythm, S1, S2 normal, no murmur, click, rub or gallop Extremities: extremities normal, atraumatic, no cyanosis or edema  EKG inus rhythm 85 without ST or T-wave changes. I personally reviewed this EKG  ASSESSMENT AND PLAN:   Essential hypertension, benign History of hypertension blood pressure measured at 108/74. He is on amlodipine, lisinopril,  hydrochlorothiazide and metoprolol. Continue current meds at current dosing  Pure hypercholesterolemia History of hyperlipidemia on statin therapy with recent lipid profile performed 03/30/16 revealing total cholesterol 163, LDL 66 HDL 35  CAD (coronary artery disease) History of CAD status post cardiac catheterization performed by myself 11/15/98 revealing three-vessel disease with preserved LV function. Medical therapy was recommended at that time. He had a 30% distal left main, 75% mid LAD, 50% obtuse marginal branch stenosis and a total RCA with left-to-right collaterals. He denies chest pain or shortness of breath.  DVT (deep venous thrombosis) History of chronic left common femoral and popliteal vein DVT by duplex ultrasound 02/09/15. His legs are fairly symmetric and asymptomatic.      [name] MD FACP,FACC,FAHA, [facility] 05/27/2016 4:13 PM

## 2016-05-27 NOTE — Assessment & Plan Note (Signed)
History of hyperlipidemia on statin therapy with recent lipid profile performed 03/30/16 revealing total cholesterol 163, LDL 66 HDL 35

## 2016-05-27 NOTE — Patient Instructions (Signed)
Medication Instructions:  Your physician recommends that you continue on your current medications as directed. Please refer to the Current Medication list given to you today.   Labwork: NONE  Testing/Procedures: NONE  Follow-Up: Your physician wants you to follow-up in: [place]. You will receive a reminder letter in the mail two months in advance. If you don't receive a letter, please call our office to schedule the follow-up appointment.   Any Other Special Instructions Will Be Listed Below (If Applicable).     If you need a refill on your cardiac medications before your next appointment, please call your pharmacy.

## 2016-06-10 ENCOUNTER — Other Ambulatory Visit: Payer: Self-pay | Admitting: *Deleted

## 2016-06-10 MED ORDER — PANTOPRAZOLE SODIUM 40 MG PO TBEC: 40.0000 mg | DELAYED_RELEASE_TABLET | Freq: Every day | ORAL | Status: DC

## 2016-06-16 ENCOUNTER — Encounter: Payer: Self-pay | Admitting: Family Medicine

## 2016-06-16 ENCOUNTER — Ambulatory Visit (INDEPENDENT_AMBULATORY_CARE_PROVIDER_SITE_OTHER): Payer: PPO | Admitting: Family Medicine

## 2016-06-16 DIAGNOSIS — N471 Phimosis: Secondary | ICD-10-CM | POA: Diagnosis not present

## 2016-06-16 MED ORDER — CLOTRIMAZOLE-BETAMETHASONE 1-0.05 % EX CREA: 1.0000 "application " | TOPICAL_CREAM | Freq: Two times a day (BID) | CUTANEOUS | Status: DC

## 2016-06-16 NOTE — [place] Notes (Signed)
Subjective:    Patient ID: Manuel Grant, male    DOB: 1946/07/29, 70 y.o.   MRN: [id number]  HPI  05/26/16 Patient is uncircumcised. Recently was started on Glyxambi.  About a week ago, developed erythema and swelling in the foreskin. Now the skin around the head of the penis is cracked red and painful. There is a milky white discharge coming from underneath the foreskin. We are unable to retract the foreskin due to pain. There is also some erythema tracking down the shaft of the penis on the skin. He denies any systemic symptoms.  At that time, my plan was: Patient distally had a candidal infection of the foreskin. However I'm concerned that he has also developed a secondary bacterial superinfection. Begin Diflucan 150 mg by mouth every other day for 2 weeks. He can apply Lotrimin cream to the affected skin as well as a lubricant as well as an antifungal. Treat the secondary superinfection with Keflex 500 mg by mouth 3 times a day for 1 week. Recheck in one week or sooner if worse. I would suggest switching away from [facility].    06/16/16 The infection and is now much better. There is no further penile discharge. There is no further erythema. However the patient has an obvious phimosis. He is unable to retract his foreskin. The foreskin in that area is cracked and painful. Past Medical History  Diagnosis Date  . Ulcerative colitis     s/p colectomy ~ 2005 at [facility]  . Renal stones     uric acid stones  . Chronic kidney disease   . Hypertension   . Hyperlipidemia   . Rosacea   . Insomnia   . Hiatal hernia   . Arthritis     Knee OA- per [facility]- injected prev by ortho  . Coronary artery disease     a. 10/1998 Cath: LM 30d, LAD 17m OM 50, RCA 100 w/ L->R collats, nl LV ->Med Rx;  b. 07/2010 MV: no ischemia.  .[name] KitchenB12 deficiency   . Osteoarthritis   . [place] ([place]   . Myocardial infarction ([facility] 1999    "heart occasion" (03/15/2014)  . Pneumonia 1970's  .  Anemia   . Deep venous thrombosis (HCC)     left lower extremity  . GI bleed   . Colon polyps   . Duodenal ulcer   . Reflux esophagitis   . Duodenal ulcer    Past Surgical History  Procedure Laterality Date  . Subtotal colectomy  2005    [facility] . Knee arthroscopy Bilateral 1990's  . Hernia repair Right ~ 2006    "abdomen; was as a result of a surgery"  . Cardiac catheterization  11/15/1998    30% distal left main, 75% mid LAD, 50% OM and total RCA with left to right collateral and normal LV function.  . Esophagogastroduodenoscopy N/A 03/16/2014    Procedure: ESOPHAGOGASTRODUODENOSCOPY (EGD);  Surgeon: JJerene Bears MD;  [facility];  Service: Endoscopy;  Laterality: N/A;  . Flexible sigmoidoscopy N/A 03/16/2014    Procedure: FLEXIBLE SIGMOIDOSCOPY;  Surgeon: JJerene Bears MD;  [facility];  Service: Endoscopy;  Laterality: N/A;  . UKoreaechocardiography  11/16/2007    mild concentric LVH,impaired LV relaxagtion,boderline RV enlargement,nodular thickening of the noncoronary cusup  . Nm myocar perf wall motion  08/06/2010    normal  . Colon surgery     Current Outpatient Prescriptions on File  Prior to Visit  Medication Sig Dispense Refill  . amLODipine (NORVASC) 10 MG tablet Take 1 tablet (10 mg total) by mouth daily. 90 tablet 3  . aspirin EC 81 MG tablet Take 1 tablet (81 mg total) by mouth daily. 90 tablet 3  . cephALEXin (KEFLEX) 500 MG capsule Take 1 capsule (500 mg total) by mouth 3 (three) times daily. 21 capsule 0  . cyanocobalamin (,VITAMIN B-12,) 1000 MCG/ML injection INJECT 1 MILLILITER INTO THE MUSCLE EVERY 30 DAYS 12 mL 11  . Empagliflozin-Linagliptin (GLYXAMBI) 25-5 MG TABS Take 1 tablet by mouth daily. 30 tablet 5  . fluconazole (DIFLUCAN) 150 MG tablet Take 1 tablet (150 mg total) by mouth every other day. 7 tablet 0  . glipiZIDE (GLUCOTROL XL) 10 MG 24 hr tablet TAKE 1 TABLET BY MOUTH ONCE A DAY 30 tablet 0  . isosorbide  mononitrate (IMDUR) 60 MG 24 hr tablet Take 1 tablet (60 mg total) by mouth daily. 90 tablet 2  . JANUVIA 100 MG tablet TAKE 1 TABLET BY MOUTH ONCE A DAY 30 tablet 3  . lisinopril-hydrochlorothiazide (PRINZIDE,ZESTORETIC) 20-12.5 MG tablet Take 1 tablet by mouth daily. 90 tablet 1  . lovastatin (MEVACOR) 10 MG tablet Take 1 tablet (10 mg total) by mouth at bedtime. 30 tablet 5  . metFORMIN (GLUCOPHAGE) 1000 MG tablet TAKE 1 TABLET TWICE A DAY WITH FOOD 60 tablet 3  . metoprolol succinate (TOPROL-XL) 50 MG 24 hr tablet Take 2 tablets (100 mg total) by mouth daily. Take 1 1/2 tablets (75 mg) By Mouth Daily. Take with or immediately following a meal. 45 tablet 5  . minocycline (MINOCIN,DYNACIN) 50 MG capsule Take 1 capsule by mouth daily.    . pantoprazole (PROTONIX) 40 MG tablet Take 1 tablet (40 mg total) by mouth daily. 30 tablet 2  . tiZANidine (ZANAFLEX) 4 MG tablet TAKE 1 TABLET BY MOUTH EVERY NIGHT AT BEDTIME AS NEEDED 30 tablet 2  . traMADol (ULTRAM) 50 MG tablet TAKE 1 OR 2 TABLETS BY MOUTH 3 TIMES A DAY AS NEEDED FOR ARTHRITIS PAIN 180 tablet 0  . zolpidem (AMBIEN) 10 MG tablet TAKE 1 TABLET BY MOUTH EVERY NIGHT AT BEDTIME AS NEEDED 30 tablet 2   No current facility-administered medications on file prior to visit.   No Known Allergies Social History   Social History  . Marital Status: Married    Spouse Name: N/A  . Number of Children: 2  . Years of Education: N/A   Occupational History  . retired    Social History Main Topics  . Smoking status: Former Smoker -- 2.00 packs/day for 10 years    Types: Cigarettes    Quit date: 12/28/1974  . Smokeless tobacco: Never Used  . Alcohol Use: 0.0 oz/week    0 Standard drinks or equivalent per week     Comment: 03/15/2014 "6 pack beer or less/yr"  . Drug Use: No  . Sexual Activity: Not Currently   Other Topics Concern  . Not on file   Social History Narrative   Married 1967   Retired from [employer] work   Lives in [place] with  his wife.     Review of Systems  All other systems reviewed and are negative.      Objective:   Physical Exam  Cardiovascular: Normal rate and normal heart sounds.   Pulmonary/Chest: Effort normal and breath sounds normal.  Genitourinary: Phimosis present. No paraphimosis, penile erythema or penile tenderness. No discharge found.  Skin: Skin is  warm. No rash noted. No erythema.          Assessment & Plan:  Phimosis - Plan: clotrimazole-betamethasone (LOTRISONE) cream, Ambulatory referral to Urology  He can apply Lotrisone cream twice daily to the dry red cracked skin on the foreskin. However I recommend a urology consultation to discuss possible circumcision to treat his phimosis.  Begin Tradjenta 5 mg poqdaya nd dc glyxambi and [place].

## 2016-06-23 ENCOUNTER — Other Ambulatory Visit: Payer: Self-pay

## 2016-06-23 ENCOUNTER — Other Ambulatory Visit: Payer: Self-pay | Admitting: Family Medicine

## 2016-06-23 MED ORDER — METOPROLOL SUCCINATE ER 50 MG PO TB24: ORAL_TABLET | ORAL | Status: DC

## 2016-06-23 MED ORDER — METOPROLOL SUCCINATE ER 50 MG PO TB24: 75.0000 mg | ORAL_TABLET | Freq: Every day | ORAL | Status: DC

## 2016-06-23 NOTE — Telephone Encounter (Signed)
okay

## 2016-06-23 NOTE — Telephone Encounter (Signed)
Ok to refill 

## 2016-06-26 ENCOUNTER — Telehealth: Payer: Self-pay | Admitting: Family Medicine

## 2016-06-26 MED ORDER — TRAMADOL HCL 50 MG PO TABS: ORAL_TABLET | ORAL | Status: DC

## 2016-06-26 NOTE — Telephone Encounter (Signed)
Yes it was for Tamadol - med called to pharm

## 2016-06-26 NOTE — Telephone Encounter (Signed)
Ok to refill??        LRF - 05/18/16  LOV - 04/02/16

## 2016-06-26 NOTE — Telephone Encounter (Signed)
I assume this is for Ultram if so, okay to refill, if another medication send back

## 2016-06-29 ENCOUNTER — Other Ambulatory Visit: Payer: Self-pay | Admitting: Family Medicine

## 2016-07-29 ENCOUNTER — Telehealth: Payer: Self-pay | Admitting: Family Medicine

## 2016-07-29 NOTE — Telephone Encounter (Signed)
Pt states that he was put on different dm medication and is almost out and would like to know if he needs to continue it or you want to change it - BS readings are 130-180.

## 2016-07-30 ENCOUNTER — Telehealth: Payer: Self-pay | Admitting: Family Medicine

## 2016-07-30 MED ORDER — LINAGLIPTIN 5 MG PO TABS: 5.0000 mg | ORAL_TABLET | Freq: Every day | ORAL | 3 refills | Status: DC

## 2016-07-30 NOTE — Telephone Encounter (Signed)
From [employer] auth for visit with Urologist  HTA Josem Kaufmann #[id number] for 6 visits from 09/08/16 - 03/07/17.  For Dx N47.1 Phimosis

## 2016-07-30 NOTE — Telephone Encounter (Signed)
Tradjenta 5 mg poqday ok with refill

## 2016-07-30 NOTE — Telephone Encounter (Signed)
Medication called/sent to requested pharmacy and pt's wife aware

## 2016-07-31 ENCOUNTER — Other Ambulatory Visit: Payer: Self-pay | Admitting: Pharmacist

## 2016-07-31 NOTE — Patient Outreach (Signed)
Outreach call to [employer] regarding his request for follow up from the [facility] Medication Adherence Campaign. Patient declines to speak with me.  Harlow Asa, PharmD Clinical Pharmacist [facility] [phone]

## 2016-08-05 ENCOUNTER — Telehealth: Payer: Self-pay | Admitting: Family Medicine

## 2016-08-05 NOTE — Telephone Encounter (Signed)
Requesting refill on Tramadol - Ok to refill??

## 2016-08-06 MED ORDER — TRAMADOL HCL 50 MG PO TABS: ORAL_TABLET | ORAL | 0 refills | Status: DC

## 2016-08-06 NOTE — Telephone Encounter (Signed)
ok 

## 2016-08-06 NOTE — Telephone Encounter (Signed)
Medication called/sent to requested pharmacy  

## 2016-08-18 ENCOUNTER — Telehealth: Payer: Self-pay | Admitting: Family Medicine

## 2016-08-18 NOTE — Telephone Encounter (Signed)
ok 

## 2016-08-18 NOTE — Telephone Encounter (Signed)
Requesting refill on Zolpidem - Ok to refill??

## 2016-08-19 MED ORDER — ZOLPIDEM TARTRATE 10 MG PO TABS: 10.0000 mg | ORAL_TABLET | Freq: Every evening | ORAL | 3 refills | Status: DC | PRN

## 2016-08-19 NOTE — Telephone Encounter (Signed)
Medication called/sent to requested pharmacy  

## 2016-09-04 ENCOUNTER — Other Ambulatory Visit: Payer: Self-pay | Admitting: *Deleted

## 2016-09-04 MED ORDER — LOVASTATIN 10 MG PO TABS: 10.0000 mg | ORAL_TABLET | Freq: Every day | ORAL | 7 refills | Status: DC

## 2016-09-04 MED ORDER — ISOSORBIDE MONONITRATE ER 60 MG PO TB24: 60.0000 mg | ORAL_TABLET | Freq: Every day | ORAL | 3 refills | Status: DC

## 2016-09-08 DIAGNOSIS — N471 Phimosis: Secondary | ICD-10-CM | POA: Diagnosis not present

## 2016-09-16 ENCOUNTER — Telehealth: Payer: Self-pay | Admitting: Family Medicine

## 2016-09-16 ENCOUNTER — Other Ambulatory Visit: Payer: Self-pay | Admitting: Urology

## 2016-09-16 NOTE — Telephone Encounter (Signed)
Requesting refill on Tramadol  - [facility] 08/06/2016

## 2016-09-17 MED ORDER — TRAMADOL HCL 50 MG PO TABS: ORAL_TABLET | ORAL | 1 refills | Status: DC

## 2016-09-17 NOTE — Telephone Encounter (Signed)
Medication called/sent to requested pharmacy  

## 2016-09-17 NOTE — Telephone Encounter (Signed)
ok 

## 2016-09-22 ENCOUNTER — Other Ambulatory Visit: Payer: Self-pay | Admitting: Cardiovascular Disease

## 2016-09-23 ENCOUNTER — Encounter (HOSPITAL_BASED_OUTPATIENT_CLINIC_OR_DEPARTMENT_OTHER): Payer: Self-pay | Admitting: *Deleted

## 2016-09-23 ENCOUNTER — Other Ambulatory Visit: Payer: Self-pay

## 2016-09-23 MED ORDER — LISINOPRIL-HYDROCHLOROTHIAZIDE 20-12.5 MG PO TABS: 1.0000 | ORAL_TABLET | Freq: Every day | ORAL | 2 refills | Status: DC

## 2016-09-23 NOTE — Telephone Encounter (Signed)
Rx(s) sent to pharmacy electronically.  

## 2016-09-24 ENCOUNTER — Encounter (HOSPITAL_BASED_OUTPATIENT_CLINIC_OR_DEPARTMENT_OTHER): Payer: Self-pay | Admitting: *Deleted

## 2016-09-24 NOTE — Progress Notes (Signed)
NPO AFTER MN.  ARRIVE AT 0900.  NEEDS ISTAT.  CURRENT EKG IN CHART AND EKG.  WILL TAKE TOPROL, NORVASC, AND PROTONIX AM DOS W/ SIPS OF WATER.

## 2016-09-25 ENCOUNTER — Ambulatory Visit (HOSPITAL_BASED_OUTPATIENT_CLINIC_OR_DEPARTMENT_OTHER)
Admission: RE | Admit: 2016-09-25 | Discharge: 2016-09-25 | Disposition: A | Discharge status: Home / Self Care | Payer: PPO | Source: Ambulatory Visit | Attending: Urology | Admitting: Urology

## 2016-09-25 ENCOUNTER — Ambulatory Visit (HOSPITAL_BASED_OUTPATIENT_CLINIC_OR_DEPARTMENT_OTHER): Payer: PPO | Admitting: Anesthesiology

## 2016-09-25 ENCOUNTER — Encounter (HOSPITAL_BASED_OUTPATIENT_CLINIC_OR_DEPARTMENT_OTHER): Payer: Self-pay | Admitting: *Deleted

## 2016-09-25 ENCOUNTER — Encounter (HOSPITAL_BASED_OUTPATIENT_CLINIC_OR_DEPARTMENT_OTHER)
Admission: RE | Disposition: A | Discharge status: Home / Self Care | Payer: Self-pay | Source: Ambulatory Visit | Attending: Urology

## 2016-09-25 DIAGNOSIS — Z955 Presence of coronary angioplasty implant and graft: Secondary | ICD-10-CM | POA: Diagnosis not present

## 2016-09-25 DIAGNOSIS — I129 Hypertensive chronic kidney disease with stage 1 through stage 4 chronic kidney disease, or unspecified chronic kidney disease: Secondary | ICD-10-CM | POA: Insufficient documentation

## 2016-09-25 DIAGNOSIS — N471 Phimosis: Secondary | ICD-10-CM | POA: Diagnosis not present

## 2016-09-25 DIAGNOSIS — K219 Gastro-esophageal reflux disease without esophagitis: Secondary | ICD-10-CM | POA: Diagnosis not present

## 2016-09-25 DIAGNOSIS — Z86718 Personal history of other venous thrombosis and embolism: Secondary | ICD-10-CM | POA: Diagnosis not present

## 2016-09-25 DIAGNOSIS — E785 Hyperlipidemia, unspecified: Secondary | ICD-10-CM | POA: Diagnosis not present

## 2016-09-25 DIAGNOSIS — Z79899 Other long term (current) drug therapy: Secondary | ICD-10-CM | POA: Insufficient documentation

## 2016-09-25 DIAGNOSIS — Z7984 Long term (current) use of oral hypoglycemic drugs: Secondary | ICD-10-CM | POA: Diagnosis not present

## 2016-09-25 DIAGNOSIS — I252 Old myocardial infarction: Secondary | ICD-10-CM | POA: Insufficient documentation

## 2016-09-25 DIAGNOSIS — E538 Deficiency of other specified B group vitamins: Secondary | ICD-10-CM | POA: Diagnosis not present

## 2016-09-25 DIAGNOSIS — Z87891 Personal history of nicotine dependence: Secondary | ICD-10-CM | POA: Diagnosis not present

## 2016-09-25 DIAGNOSIS — E1122 Type 2 diabetes mellitus with diabetic chronic kidney disease: Secondary | ICD-10-CM | POA: Diagnosis not present

## 2016-09-25 DIAGNOSIS — N481 Balanitis: Secondary | ICD-10-CM | POA: Insufficient documentation

## 2016-09-25 DIAGNOSIS — Z9049 Acquired absence of other specified parts of digestive tract: Secondary | ICD-10-CM | POA: Diagnosis not present

## 2016-09-25 DIAGNOSIS — N182 Chronic kidney disease, stage 2 (mild): Secondary | ICD-10-CM | POA: Diagnosis not present

## 2016-09-25 DIAGNOSIS — I2510 Atherosclerotic heart disease of native coronary artery without angina pectoris: Secondary | ICD-10-CM | POA: Diagnosis not present

## 2016-09-25 DIAGNOSIS — G4700 Insomnia, unspecified: Secondary | ICD-10-CM | POA: Diagnosis not present

## 2016-09-25 HISTORY — DX: Personal history of urinary calculi: Z87.442

## 2016-09-25 HISTORY — DX: Unspecified symptoms and signs involving the genitourinary system: R39.9

## 2016-09-25 HISTORY — DX: Personal history of other diseases of male genital organs: Z87.438

## 2016-09-25 HISTORY — DX: Chronic kidney disease, stage 2 (mild): N18.2

## 2016-09-25 HISTORY — DX: Personal history of colonic polyps: Z86.010

## 2016-09-25 HISTORY — DX: Personal history of other infectious and parasitic diseases: Z86.19

## 2016-09-25 HISTORY — DX: Gastro-esophageal reflux disease without esophagitis: K21.9

## 2016-09-25 HISTORY — PX: CIRCUMCISION: SHX1350

## 2016-09-25 HISTORY — DX: Phimosis: N47.1

## 2016-09-25 HISTORY — DX: Chronic embolism and thrombosis of unspecified deep veins of unspecified lower extremity: I82.509

## 2016-09-25 HISTORY — DX: Other cervical disc degeneration, unspecified cervical region: M50.30

## 2016-09-25 HISTORY — DX: Other complications of intestinal pouch: K91.858

## 2016-09-25 HISTORY — DX: Type 2 diabetes mellitus without complications: E11.9

## 2016-09-25 HISTORY — DX: Presence of dental prosthetic device (complete) (partial): Z97.2

## 2016-09-25 HISTORY — DX: Personal history of other diseases of the digestive system: Z87.19

## 2016-09-25 HISTORY — DX: Personal history of adenomatous and serrated colon polyps: Z86.0101

## 2016-09-25 HISTORY — DX: Presence of spectacles and contact lenses: Z97.3

## 2016-09-25 LAB — GLUCOSE, CAPILLARY: GLUCOSE-CAPILLARY: 140 mg/dL — AB (ref 65–99)

## 2016-09-25 LAB — POCT I-STAT, CHEM 8
BUN: 8 mg/dL (ref 6–20)
Calcium, Ion: 1.19 mmol/L (ref 1.15–1.40)
Chloride: 103 mmol/L (ref 101–111)
Creatinine, Ser: 1 mg/dL (ref 0.61–1.24)
GLUCOSE: 134 mg/dL — AB (ref 65–99)
HCT: 41 % (ref 39.0–52.0)
HEMOGLOBIN: 13.9 g/dL (ref 13.0–17.0)
POTASSIUM: 3.6 mmol/L (ref 3.5–5.1)
Sodium: 143 mmol/L (ref 135–145)
TCO2: 27 mmol/L (ref 0–100)

## 2016-09-25 SURGERY — CIRCUMCISION, ADULT
Anesthesia: General | Site: Penis

## 2016-09-25 MED ORDER — DEXAMETHASONE SODIUM PHOSPHATE 4 MG/ML IJ SOLN
INTRAMUSCULAR | Status: DC | PRN
  Administered 2016-09-25: 5 mg via INTRAVENOUS

## 2016-09-25 MED ORDER — DEXAMETHASONE SODIUM PHOSPHATE 10 MG/ML IJ SOLN
INTRAMUSCULAR | Status: AC
  Filled 2016-09-25: qty 1

## 2016-09-25 MED ORDER — FENTANYL CITRATE (PF) 100 MCG/2ML IJ SOLN
INTRAMUSCULAR | Status: DC | PRN
  Administered 2016-09-25: 50 ug via INTRAVENOUS
  Administered 2016-09-25 (×2): 25 ug via INTRAVENOUS

## 2016-09-25 MED ORDER — DEXAMETHASONE SODIUM PHOSPHATE 10 MG/ML IJ SOLN
INTRAMUSCULAR | Status: AC
  Filled 2016-09-25: qty 2

## 2016-09-25 MED ORDER — LIDOCAINE 2% (20 MG/ML) 5 ML SYRINGE
INTRAMUSCULAR | Status: DC | PRN
  Administered 2016-09-25: 75 mg via INTRAVENOUS

## 2016-09-25 MED ORDER — SENNOSIDES-DOCUSATE SODIUM 8.6-50 MG PO TABS: 1.0000 | ORAL_TABLET | Freq: Two times a day (BID) | ORAL | 0 refills | Status: DC

## 2016-09-25 MED ORDER — AMLODIPINE BESYLATE 10 MG PO TABS
10.0000 mg | ORAL_TABLET | Freq: Every day | ORAL | Status: DC
  Administered 2016-09-25: 10 mg via ORAL
  Filled 2016-09-25 (×2): qty 1

## 2016-09-25 MED ORDER — LIDOCAINE 2% (20 MG/ML) 5 ML SYRINGE
INTRAMUSCULAR | Status: AC
  Filled 2016-09-25: qty 5

## 2016-09-25 MED ORDER — CLINDAMYCIN PHOSPHATE 900 MG/50ML IV SOLN
INTRAVENOUS | Status: AC
  Filled 2016-09-25: qty 50

## 2016-09-25 MED ORDER — METOPROLOL TARTRATE 5 MG/5ML IV SOLN
INTRAVENOUS | Status: DC | PRN
  Administered 2016-09-25: .5 mg via INTRAVENOUS
  Administered 2016-09-25 (×2): 1 mg via INTRAVENOUS
  Administered 2016-09-25: .5 mg via INTRAVENOUS

## 2016-09-25 MED ORDER — KETOROLAC TROMETHAMINE 30 MG/ML IJ SOLN
INTRAMUSCULAR | Status: DC | PRN
  Administered 2016-09-25: 30 mg via INTRAVENOUS

## 2016-09-25 MED ORDER — KETOROLAC TROMETHAMINE 30 MG/ML IJ SOLN
INTRAMUSCULAR | Status: AC
  Filled 2016-09-25: qty 1

## 2016-09-25 MED ORDER — PROPOFOL 10 MG/ML IV BOLUS
INTRAVENOUS | Status: AC
  Filled 2016-09-25: qty 20

## 2016-09-25 MED ORDER — LACTATED RINGERS IV SOLN
INTRAVENOUS | Status: DC
  Administered 2016-09-25: 10:00:00 via INTRAVENOUS
  Filled 2016-09-25: qty 1000

## 2016-09-25 MED ORDER — ONDANSETRON HCL 4 MG/2ML IJ SOLN
INTRAMUSCULAR | Status: DC | PRN
  Administered 2016-09-25: 4 mg via INTRAVENOUS

## 2016-09-25 MED ORDER — ONDANSETRON HCL 4 MG/2ML IJ SOLN
INTRAMUSCULAR | Status: AC
  Filled 2016-09-25: qty 2

## 2016-09-25 MED ORDER — ARTIFICIAL TEARS OP OINT
TOPICAL_OINTMENT | OPHTHALMIC | Status: AC
  Filled 2016-09-25: qty 3.5

## 2016-09-25 MED ORDER — PROPOFOL 10 MG/ML IV BOLUS
INTRAVENOUS | Status: DC | PRN
  Administered 2016-09-25: 200 mg via INTRAVENOUS
  Administered 2016-09-25: 50 mg via INTRAVENOUS

## 2016-09-25 MED ORDER — OXYCODONE-ACETAMINOPHEN 5-325 MG PO TABS: 1.0000 | ORAL_TABLET | Freq: Four times a day (QID) | ORAL | 0 refills | Status: DC | PRN

## 2016-09-25 MED ORDER — METOPROLOL SUCCINATE ER 50 MG PO TB24
50.0000 mg | ORAL_TABLET | Freq: Every day | ORAL | Status: DC
  Administered 2016-09-25: 50 mg via ORAL
  Filled 2016-09-25 (×2): qty 1

## 2016-09-25 MED ORDER — BUPIVACAINE HCL 0.25 % IJ SOLN
INTRAMUSCULAR | Status: DC | PRN
  Administered 2016-09-25: 10 mL

## 2016-09-25 MED ORDER — FENTANYL CITRATE (PF) 100 MCG/2ML IJ SOLN
INTRAMUSCULAR | Status: AC
  Filled 2016-09-25: qty 2

## 2016-09-25 MED ORDER — CLINDAMYCIN PHOSPHATE 900 MG/50ML IV SOLN
900.0000 mg | INTRAVENOUS | Status: AC
  Administered 2016-09-25: 900 mg via INTRAVENOUS
  Filled 2016-09-25: qty 50

## 2016-09-25 SURGICAL SUPPLY — 38 items
BANDAGE COBAN STERILE 2 (GAUZE/BANDAGES/DRESSINGS) IMPLANT
BLADE SURG 15 STRL LF DISP TIS (BLADE) ×1 IMPLANT
BLADE SURG 15 STRL SS (BLADE) ×2
BNDG CONFORM 2 STRL LF (GAUZE/BANDAGES/DRESSINGS) ×3 IMPLANT
COVER BACK TABLE 60X90IN (DRAPES) ×3 IMPLANT
COVER MAYO STAND STRL (DRAPES) ×3 IMPLANT
DECANTER SPIKE VIAL GLASS SM (MISCELLANEOUS) IMPLANT
DRAPE LAPAROTOMY 100X72 PEDS (DRAPES) ×3 IMPLANT
DRAPE LAPAROTOMY T 102X78X121 (DRAPES) ×3 IMPLANT
ELECT NEEDLE TIP 2.8 STRL (NEEDLE) IMPLANT
ELECT REM PT RETURN 9FT ADLT (ELECTROSURGICAL) ×3
ELECTRODE REM PT RTRN 9FT ADLT (ELECTROSURGICAL) ×1 IMPLANT
GAUZE SPONGE 4X4 16PLY XRAY LF (GAUZE/BANDAGES/DRESSINGS) ×3 IMPLANT
GAUZE XEROFORM 1X8 LF (GAUZE/BANDAGES/DRESSINGS) ×3 IMPLANT
GLOVE BIO SURGEON STRL SZ7 (GLOVE) ×3 IMPLANT
GLOVE BIO SURGEON STRL SZ7.5 (GLOVE) ×3 IMPLANT
GLOVE BIOGEL PI IND STRL 7.0 (GLOVE) ×1 IMPLANT
GLOVE BIOGEL PI IND STRL 7.5 (GLOVE) ×1 IMPLANT
GLOVE BIOGEL PI INDICATOR 7.0 (GLOVE) ×2
GLOVE BIOGEL PI INDICATOR 7.5 (GLOVE) ×2
GOWN STRL REUS W/ TWL XL LVL3 (GOWN DISPOSABLE) ×2 IMPLANT
GOWN STRL REUS W/TWL XL LVL3 (GOWN DISPOSABLE) ×4
KIT ROOM TURNOVER WOR (KITS) ×3 IMPLANT
LIQUID BAND (GAUZE/BANDAGES/DRESSINGS) ×3 IMPLANT
MANIFOLD NEPTUNE II (INSTRUMENTS) IMPLANT
NEEDLE HYPO 25X1 1.5 SAFETY (NEEDLE) IMPLANT
NS IRRIG 500ML POUR BTL (IV SOLUTION) IMPLANT
PACK BASIN DAY SURGERY FS (CUSTOM PROCEDURE TRAY) ×3 IMPLANT
PENCIL BUTTON HOLSTER BLD 10FT (ELECTRODE) ×3 IMPLANT
SPONGE GAUZE 4X4 12PLY (GAUZE/BANDAGES/DRESSINGS) ×3 IMPLANT
SPONGE GAUZE 4X4 12PLY STER LF (GAUZE/BANDAGES/DRESSINGS) ×3 IMPLANT
SUT VICRYL 4-0 PS2 18IN ABS (SUTURE) ×6 IMPLANT
SYR CONTROL 10ML LL (SYRINGE) IMPLANT
TOWEL OR 17X26 10 PK STRL BLUE (TOWEL DISPOSABLE) ×3 IMPLANT
TRAY DSU PREP LF (CUSTOM PROCEDURE TRAY) ×3 IMPLANT
TUBE CONNECTING 12'X1/4 (SUCTIONS)
TUBE CONNECTING 12X1/4 (SUCTIONS) IMPLANT
WATER STERILE IRR 500ML POUR (IV SOLUTION) IMPLANT

## 2016-09-25 NOTE — Brief Op Note (Signed)
09/25/2016  11:21 AM  PATIENT:  Gerilyn Nestle Riviello  70 y.o. male  PRE-OPERATIVE DIAGNOSIS:  PHIMOSIS  POST-OPERATIVE DIAGNOSIS:  PHIMOSIS  PROCEDURE:  Procedure(s): CIRCUMCISION ADULT (N/A)  SURGEON:  Surgeon(s) and Role:    * Alexis Frock, MD - Primary  PHYSICIAN ASSISTANT:   ASSISTANTS: none   ANESTHESIA:   local and general  EBL:  Total I/O In: 500 [I.V.:500] Out: 25 [Blood:25]  BLOOD ADMINISTERED:none  DRAINS: none   LOCAL MEDICATIONS USED:  MARCAINE     SPECIMEN:  Source of Specimen:  foreskin  DISPOSITION OF SPECIMEN:  PATHOLOGY  COUNTS:  YES  TOURNIQUET:  * No tourniquets in log *  DICTATION: .Other Dictation: Dictation Number [id number]  PLAN OF CARE: Discharge to home after PACU  PATIENT DISPOSITION:  PACU - hemodynamically stable.   Delay start of Pharmacological VTE agent (>24hrs) due to surgical blood loss or risk of bleeding: yes

## 2016-09-25 NOTE — Transfer of Care (Signed)
Immediate Anesthesia Transfer of Care Note  Patient: Manuel Grant  Procedure(s) Performed: Procedure(s): CIRCUMCISION ADULT (N/A)  Patient Location: PACU  Anesthesia Type:General  Level of Consciousness: awake, alert , sedated and patient cooperative  Airway & Oxygen Therapy: Patient Spontanous Breathing and Patient connected to nasal cannula oxygen  Post-op Assessment: Report given to RN and Post -op Vital signs reviewed and stable  Post vital signs: Reviewed and stable  Last Vitals:  Vitals:   09/25/16 1003 09/25/16 1130  BP: (!) 156/97   Resp:    Temp:  (P) 36.8 C    Last Pain:  Vitals:   09/25/16 0950  TempSrc: Oral      Patients Stated Pain Goal: 5 (05/29/55 1537)  Complications: No apparent anesthesia complications

## 2016-09-25 NOTE — Anesthesia Postprocedure Evaluation (Signed)
Anesthesia Post Note  Patient: Manuel Grant  Procedure(s) Performed: Procedure(s) (LRB): CIRCUMCISION ADULT (N/A)  Patient location during evaluation: PACU Anesthesia Type: General Level of consciousness: awake and alert Pain management: pain level controlled Vital Signs Assessment: post-procedure vital signs reviewed and stable Respiratory status: spontaneous breathing, nonlabored ventilation, respiratory function stable and patient connected to nasal cannula oxygen Cardiovascular status: blood pressure returned to baseline and stable Postop Assessment: no signs of nausea or vomiting Anesthetic complications: no    Last Vitals:  Vitals:   09/25/16 1215 09/25/16 1230  BP: (!) 154/98 (!) 158/99  Pulse: 79 75  Resp: 16 20  Temp:      Last Pain:  Vitals:   09/25/16 0950  TempSrc: Oral                 Enez Monahan JENNETTE

## 2016-09-25 NOTE — Anesthesia Procedure Notes (Signed)
Procedure Name: LMA Insertion Date/Time: 09/25/2016 10:46 AM Performed by: Wanita Chamberlain Pre-anesthesia Checklist: Patient identified, Timeout performed, Emergency Drugs available, Suction available and Patient being monitored Patient Re-evaluated:Patient Re-evaluated prior to inductionOxygen Delivery Method: Circle system utilized Preoxygenation: Pre-oxygenation with 100% oxygen Intubation Type: IV induction Ventilation: Mask ventilation without difficulty LMA: LMA with gastric port inserted LMA Size: 5.0 Number of attempts: 1 Placement Confirmation: positive ETCO2 and breath sounds checked- equal and bilateral Tube secured with: Tape Dental Injury: Teeth and Oropharynx as per pre-operative assessment

## 2016-09-25 NOTE — Discharge Instructions (Signed)
1-  Stiches - Your stitches are all dissolvable. You may notice a "loose thread" at your incisions, these are normal and require no intervention. They will dissolve completely over the next 3-4 weeks.   You may removed dressing tomorrow morning.  2 - Diet - No restrictions  3 - Activity - No sexual stimulation x 2 weeks. Otherwise no restrictions.   4 - Bathing - You may shower immediately. Do not take a bath or get into swimming pool where incision sites are submersed in water x 2 weeks.   5 - When to Call the Doctor - Call MD for any fever >102, any acute wound problems, or any severe nausea / vomiting. You can call the [facility] [phone]) 24 hours a day 365 days a year. It will roll-over to the answering service and on-call physician after hours.  Circumcision-Home Care Instructions  The following instructions have been prepared to help you care for yourself upon your return home today.   Wound Care & Hygiene:   You may apply ice to the penis.  This may help to decrease swelling.  Remove the dressing tomorrow.  If the dressing falls off before then, leave it off.  You may shower or bathe in am.  Gently wash the penis with soap and water.  The stitches do not need to be removed.  Activity:  Do not drive or operate any equipment today.  The effects of anesthesia are still present, drowsiness may result.  Rest today, not necessarily flat bed rest, just take it easy.  You may resume your normal activity in one to two days or as indicated by your physician.  Sexual Activity:  Erection and sexual relations should be avoided for 2 weeks.  Return to Work:  One to two days or as indicated by your physician  Diet:  Drink liquids or eat a very light diet this evening.  You may resume a regular diet tomorrow.  General Expectations of your surgery:   You may have a small amount of bleeding  The penis will be swollen and bruised for approximately one week  You may wake  during the night with an erection, usually this is caused by having a full bladder so you should try to urinate (pass your water) to relieve the erection or apply ice to the penis  Unexpected Observations - Call your doctor if these occur!  Persistent or heavy bleeding  Temperature of 101 degrees or more  Severe pain not relieved by medication  Return to Office  Call to schedule your appointment Additional Instructions:  Patient's Signature:__________________________________________________     Manuel Grant. [address], [place], [place] [address] Tel: [place] Instructions  Activity: Get plenty of rest for the remainder of the day. A responsible adult should stay with you for 24 hours following the procedure.  For the next 24 hours, DO NOT: -Drive a car -Paediatric nurse -Drink alcoholic beverages -Take any medication unless instructed by your physician -Make any legal decisions or sign important papers.  Meals: Start with liquid foods such as gelatin or soup. Progress to regular foods as tolerated. Avoid greasy, spicy, heavy foods. If nausea and/or vomiting occur, drink only clear liquids until the nausea and/or vomiting subsides. Call your physician if vomiting continues.  Special Instructions/Symptoms: Your throat may feel dry or sore from the anesthesia or the breathing tube placed in your throat during surgery. If this causes discomfort, gargle with  warm salt water. The discomfort should disappear within 24 hours.  If you had a scopolamine patch placed behind your ear for the management of post- operative nausea and/or vomiting:  1. The medication in the patch is effective for 72 hours, after which it should be removed.  Wrap patch in a tissue and discard in the trash. Wash hands thoroughly with soap and water. 2. You may remove the patch earlier than 72 hours if you experience unpleasant side effects which may include  dry mouth, dizziness or visual disturbances. 3. Avoid touching the patch. Wash your hands with soap and water after contact with the patch.

## 2016-09-25 NOTE — H&P (Signed)
Manuel Grant is an 70 y.o. male.    Chief Complaint: Pre-op Circumcision  HPI:   1 - Phimosis - inability to retract foreskin x years. Progressive. Few episodes of balanoposthitis. He is diabetic.   PMH sig for CAD/MI/Stent (now ASA only), Ulcerative colitis (colectomy / J pouch, no immune modulators), DM2. His PCP is Odette Fraction MD with [facility].   Today " Manuel Grant" is seen to proceed with elective circumcision.    Past Medical History:  Diagnosis Date  . B12 deficiency   . Chronic deep vein thrombosis (DVT) ([place]) dx 2015  start on xarelto  LLE--  stopped in 03/ 2016 due to GI bleed   last doppler 02-09-2015 chronic left common femoral and popliteal dvt's (mild partial resolution since dx 2015)  . CKD (chronic kidney disease), stage II   . Coronary artery disease cardiologist-  dr berry   a. 10/1998 Cath: LM 30d, LAD 57m OM 50, RCA 100 w/ L->R collats, nl LV ->Med Rx;  b. 07/2010 MV: no ischemia.  . DDD (degenerative disc disease), cervical   . GERD (gastroesophageal reflux disease)   . Hiatal hernia   . History of adenomatous polyp of colon   . History of balanitis    05/ 2017  . History of duodenal ulcer    w/ GI bleed in 2015 and 2016  . History of GI bleed    secondary to upper duodenal ulcer's 2015 and 2016   . History of Helicobacter pylori infection    2015  . History of kidney stones   . Hyperlipidemia   . Hypertension   . Ileo-anal pouch ulcer (HCC)    chronic  . Insomnia   . Lower urinary tract symptoms (LUTS)   . Osteoarthritis    knees  . Phimosis   . Rosacea   . Type 2 diabetes mellitus ([place]    last A1c 8.5 on 03-30-2016  . Ulcerative colitis followed by  [name]   [facility] . Wears dentures    upper  . Wears glasses     Past Surgical History:  Procedure Laterality Date  . CARDIAC CATHETERIZATION  11/15/1998   dr berry   30% distal left main, 75% mid LAD, 50% OM and total RCA with left to right  collateral and normal LV function.  . COMPLETION PROCTECTOMY/ CONSTRUCTION ILEAL J POUCH/ DIVERTING LOOP ILEOSTOMY/ EXTENSIVE LYSIS ADHESIONS  11-07-2004   [facility]  . ESOPHAGOGASTRODUODENOSCOPY N/A 03/16/2014   Procedure: ESOPHAGOGASTRODUODENOSCOPY (EGD);  Surgeon: JJerene Bears MD;  [facility];  Service: Endoscopy;  Laterality: N/A;  . FLEXIBLE SIGMOIDOSCOPY N/A 03/16/2014   Procedure: FLEXIBLE SIGMOIDOSCOPY;  Surgeon: JJerene Bears MD;  [facility];  Service: Endoscopy;  Laterality: N/A;  . KNEE ARTHROSCOPY Bilateral 1990's  . NM MYOCAR PERF WALL MOTION  08/06/2010   dr berry   Low risk nuclear study w/ no ischemia (no significant change compared to prior study)/  normal LV function and wall motion , ef 60%  . SUBTOTAL COLECTOMY  W/ END ILEOSTOMY  05/ 2005   [facility]  . TAKE DOWN LOOP ILEOSTOMY  01-28-2005    [facility]  . TRANSTHORACIC ECHOCARDIOGRAM  11/16/2007   mild concentric LVH,  ef >55%,  grade 1 diastolic dysfuction/  trivial MR and TR/  borderline RVE/  focal nodular thickening of noncoronary cusp of trileaflet AV/      Family History  Problem Relation Age of Onset  .  Heart disease Mother   . Diabetes Mother   . Arthritis Mother   . Hypertension Mother   . COPD Father   . Emphysema Father   . Asthma Father   . Colon polyps Brother   . Colon cancer Neg Hx   . Prostate cancer Neg Hx    Social History:  reports that he quit smoking about 41 years ago. His smoking use included Cigarettes. He has a 18.00 pack-year smoking history. He has never used smokeless tobacco. He reports that he does not drink alcohol or use drugs.  Allergies: No Known Allergies  No prescriptions prior to admission.    No results found for this or any previous visit (from the past 48 hour(s)). No results found.  Review of Systems  Constitutional: Negative.   HENT: Negative.   Eyes: Negative.   Respiratory: Negative.   Cardiovascular: Negative.   Gastrointestinal: Negative.    Genitourinary: Negative.   Musculoskeletal: Negative.   Skin: Negative.   Neurological: Negative.   Endo/Heme/Allergies: Negative.   Psychiatric/Behavioral: Negative.     Height 5' 6.75" (1.695 m), weight 98 kg (216 lb). Physical Exam  Constitutional: He is oriented to person, place, and time. He appears well-developed.  HENT:  Head: Normocephalic.  Eyes: Pupils are equal, round, and reactive to light.  Neck: Normal range of motion.  Cardiovascular: Normal rate.   Respiratory: Effort normal.  GI: Soft.  Genitourinary:  Genitourinary Comments: Significant/ stable phimosis.  Musculoskeletal: Normal range of motion.  Neurological: He is alert and oriented to person, place, and time.  Skin: Skin is warm.  Psychiatric: He has a normal mood and affect. His behavior is normal. Judgment and thought content normal.     Assessment/Plan  Rediscussed natural history of phimosis and management options including topical steroids (softens some, does not reverse), dorsal slit (keeps foreskin, but allows retraction), or circumcision (most definitive).   He opts for circumcision. Risks, benefits, alternaitves, expected peri-op course discussed previously and reiterated today.   Alexis Frock, MD 09/25/2016, 6:10 AM

## 2016-09-25 NOTE — Anesthesia Preprocedure Evaluation (Addendum)
Anesthesia Evaluation  Patient identified by MRN, date of birth, ID band Patient awake    Reviewed: Allergy & Precautions, NPO status , Patient's Chart, lab work & pertinent test results  History of Anesthesia Complications Negative for: history of anesthetic complications  Airway Mallampati: III  TM Distance: >3 FB Neck ROM: Full    Dental no notable dental hx. (+) Dental Advisory Given, Upper Dentures, Edentulous Upper   Pulmonary former smoker,    Pulmonary exam normal breath sounds clear to auscultation       Cardiovascular hypertension (Took last dose at 9pm last night 9/28), Pt. on medications and Pt. on home beta blockers + CAD  Normal cardiovascular exam Rhythm:Regular Rate:Normal     Neuro/Psych negative neurological ROS  negative psych ROS   GI/Hepatic Neg liver ROS, GERD  Medicated and Controlled,  Endo/Other  diabetesobesity  Renal/GU Renal disease  negative genitourinary   Musculoskeletal  (+) Arthritis ,   Abdominal   Peds negative pediatric ROS (+)  Hematology negative hematology ROS (+)   Anesthesia Other Findings   Reproductive/Obstetrics negative OB ROS                            Anesthesia Physical Anesthesia Plan  ASA: II  Anesthesia Plan: General   Post-op Pain Management:    Induction: Intravenous  Airway Management Planned: LMA  Additional Equipment:   Intra-op Plan:   Post-operative Plan: Extubation in OR  Informed Consent: I have reviewed the patients History and Physical, chart, labs and discussed the procedure including the risks, benefits and alternatives for the proposed anesthesia with the patient or authorized representative who has indicated his/her understanding and acceptance.   Dental advisory given  Plan Discussed with: CRNA  Anesthesia Plan Comments:         Anesthesia Quick Evaluation

## 2016-09-28 ENCOUNTER — Encounter (HOSPITAL_BASED_OUTPATIENT_CLINIC_OR_DEPARTMENT_OTHER): Payer: Self-pay | Admitting: Urology

## 2016-09-28 NOTE — Op Note (Signed)
Manuel Grant, Manuel Grant             ACCOUNT NO.:  [id number]  MEDICAL RECORD NO.:  [id number]  LOCATION:                                 FACILITY:  PHYSICIAN:  Alexis Frock, MD     DATE OF BIRTH:  11/23/1946  DATE OF PROCEDURE: 09/25/2016                               OPERATIVE REPORT   DIAGNOSIS:  Phimosis with recurrent balanitis.  PROCEDURE: 1. Circumcision. 2. Penile block.  ESTIMATED BLOOD LOSS:  Nil.  COMPLICATION:  None.  SPECIMEN:  Foreskin for permanent pathology.  FINDINGS:  Moderate phimosis without active balanitis following circumcision.  INDICATION:  Manuel Grant is a pleasant 70 year old gentleman with history of diabetes, recurrent balanitis, and progressive phimosis. Options have been discussed for therapy including continued p.r.n. management with topical versus more definitive management with circumcision versus dorsal slit.  He wished to proceed with elective circumcision.  Informed consent was obtained and placed in the medical record.  PROCEDURE IN DETAIL:  The patient being Manuel Grant was verified. Procedure being circumcision was confirmed.  Procedure was carried out. Time-out was performed.  Intravenous antibiotics were administered. General LMA anesthesia was introduced.  The patient was placed into a supine position, sterile field was created by prepping and draping the patient's penis, perineum, and proximal thighs using iodine, his inner and outer foreskin leaflets were prepped.  The proximal collar was marked corresponding to the unstretched corona of the glans.  The distal collar was marked corresponding to a segment approximately 7 mm proximal to the corona of the glans.  The 12 o'clock position was also marked On the penis.  Proximal and distal collars were incised, connected in the midline, 12 o'clock position, and the redundant prepuce, freed up from underlying tissue using cautery dissection. Foreskin was set aside for  permanent pathology.  Stay sutures were applied at the 12 o'clock position and the 6 o'clock position with the 6 o'clock position being a U-stitch at the area of the frenulum, which resulted in excellent hemostasis of the structure.  Next, two separate running suture lines of Vicryl were used to reapproximate the lateral edges, resulted in excellent hemostasis and excellent cosmesis. Dermabond was applied to the suture line while it was drying.  Penile block was performed using a 10 mL of 0.25% plain Marcaine.  A 5 mL injected at the area inferior to the pubic ramus above the penis and another 5 mL in a ring block-type fashion at the base of the penis.  A dressing of Xeroform and gauze was applied and procedure was terminated.  The patient tolerated the procedure well.  There were no immediate periprocedural complications.  The patient was taken to the postanesthesia care in stable condition.    ______________________________ Alexis Frock, MD   ______________________________ Alexis Frock, MD    TM/MEDQ  D:  09/25/2016  T:  09/26/2016  Job:  [id number]

## 2016-10-27 ENCOUNTER — Telehealth: Payer: Self-pay | Admitting: Family Medicine

## 2016-10-27 NOTE — Telephone Encounter (Signed)
Pharmacy asking for refill of Tramadol.  This is not on med list or has been ordered by [place].  Returned to pharmacy with "Who is ordering this?"

## 2016-10-27 NOTE — Telephone Encounter (Signed)
ok 

## 2016-10-27 NOTE — Telephone Encounter (Signed)
Manuel Grant with [facility] requesting refill on tramadol.  Ok to refill??  Last refill from MD on 09/17/2016, #180- 2 tabs PO TID PRN.

## 2016-10-28 MED ORDER — TRAMADOL HCL 50 MG PO TABS: 100.0000 mg | ORAL_TABLET | Freq: Three times a day (TID) | ORAL | 0 refills | Status: DC | PRN

## 2016-10-28 NOTE — Telephone Encounter (Signed)
Medication called to pharmacy. 

## 2016-10-28 NOTE — Addendum Note (Signed)
Addended by: Sheral Flow on: 10/28/2016 01:43 PM   Modules accepted: Orders

## 2016-10-29 ENCOUNTER — Encounter: Payer: Self-pay | Admitting: Family Medicine

## 2016-10-29 ENCOUNTER — Ambulatory Visit (INDEPENDENT_AMBULATORY_CARE_PROVIDER_SITE_OTHER): Payer: PPO | Admitting: Family Medicine

## 2016-10-29 VITALS — BP 84/64 | HR 96 | Temp 98.8°F | Resp 18 | Wt 218.0 lb

## 2016-10-29 DIAGNOSIS — E1100 Type 2 diabetes mellitus with hyperosmolarity without nonketotic hyperglycemic-hyperosmolar coma (NKHHC): Secondary | ICD-10-CM | POA: Diagnosis not present

## 2016-10-29 DIAGNOSIS — I2510 Atherosclerotic heart disease of native coronary artery without angina pectoris: Secondary | ICD-10-CM | POA: Diagnosis not present

## 2016-10-29 DIAGNOSIS — I10 Essential (primary) hypertension: Secondary | ICD-10-CM

## 2016-10-29 NOTE — Progress Notes (Signed)
Subjective:    Patient ID: Manuel Grant, male    DOB: Apr 08, 1946, 70 y.o.   MRN: [id number]  HPI The patient's blood pressure is very low today however this is a coincidental finding. He is completely asymptomatic. He denies any orthostatic dizziness, shortness of breath, lightheadedness, chest pain. He denies any blood in his stool or melena. He does have a significant past history of GI bleed. He is not taking any NSAIDs or blood thinner. He is however on numerous medications that could lower his blood pressure. He is not regularly checking his blood pressure at home and he is also not regularly checking his blood sugars. He states that his blood sugars randomly tend to range between 160 and 180. He denies any hypoglycemic episodes. He denies any myalgias or right upper quadrant pain. He denies any polyuria polydipsia or blurry vision or neuropathy. Flu shot is up-to-date Past Medical History:  Diagnosis Date  . B12 deficiency   . Chronic deep vein thrombosis (DVT) ([place]) dx 2015  start on xarelto  LLE--  stopped in 03/ 2016 due to GI bleed   last doppler 02-09-2015 chronic left common femoral and popliteal dvt's (mild partial resolution since dx 2015)  . CKD (chronic kidney disease), stage II   . Coronary artery disease cardiologist-  dr berry   a. 10/1998 Cath: LM 30d, LAD 68m OM 50, RCA 100 w/ L->R collats, nl LV ->Med Rx;  b. 07/2010 MV: no ischemia.  . DDD (degenerative disc disease), cervical   . GERD (gastroesophageal reflux disease)   . Hiatal hernia   . History of adenomatous polyp of colon   . History of balanitis    05/ 2017  . History of duodenal ulcer    w/ GI bleed in 2015 and 2016  . History of GI bleed    secondary to upper duodenal ulcer's 2015 and 2016   . History of Helicobacter pylori infection    2015  . History of kidney stones   . Hyperlipidemia   . Hypertension   . Ileo-anal pouch ulcer (HCC)    chronic  . Insomnia   . Lower urinary tract symptoms  (LUTS)   . Osteoarthritis    knees  . Phimosis   . Rosacea   . Type 2 diabetes mellitus ([place]    last A1c 8.5 on 03-30-2016  . Ulcerative colitis followed by  [name]   [facility]    upper  . Wears glasses    Past Surgical History:  Procedure Laterality Date  . CARDIAC CATHETERIZATION  11/15/1998   dr berry   30% distal left main, 75% mid LAD, 50% OM and total RCA with left to right collateral and normal LV function.  .[name] KitchenCIRCUMCISION N/A 09/25/2016   Procedure: CIRCUMCISION ADULT;  Surgeon: TAlexis Frock MD;  [facility]  Service: Urology;  Laterality: N/A;  . COMPLETION PROCTECTOMY/ CONSTRUCTION ILEAL J POUCH/ DIVERTING LOOP ILEOSTOMY/ EXTENSIVE LYSIS ADHESIONS  11-07-2004   [facility]  . ESOPHAGOGASTRODUODENOSCOPY N/A 03/16/2014   Procedure: ESOPHAGOGASTRODUODENOSCOPY (EGD);  Surgeon: JJerene Bears MD;  [facility]  Service: Endoscopy;  Laterality: N/A;  . FLEXIBLE SIGMOIDOSCOPY N/A 03/16/2014   Procedure: FLEXIBLE SIGMOIDOSCOPY;  Surgeon: JJerene Bears MD;  [facility];  Service: Endoscopy;  Laterality: N/A;  . KNEE ARTHROSCOPY Bilateral 1990's  . NM MYOCAR PERF WALL MOTION  08/06/2010   dr berry   Low risk  nuclear study w/ no ischemia (no significant change compared to prior study)/  normal LV function and wall motion , ef 60%  . SUBTOTAL COLECTOMY  W/ END ILEOSTOMY  05/ 2005   [facility]  . TAKE DOWN LOOP ILEOSTOMY  01-28-2005    [facility]  . TRANSTHORACIC ECHOCARDIOGRAM  11/16/2007   mild concentric LVH,  ef >55%,  grade 1 diastolic dysfuction/  trivial MR and TR/  borderline RVE/  focal nodular thickening of noncoronary cusp of trileaflet AV/     Current Outpatient Prescriptions on File Prior to Visit  Medication Sig Dispense Refill  . amLODipine (NORVASC) 10 MG tablet Take 1 tablet (10 mg total) by mouth daily. (Patient taking differently: Take 10 mg by mouth every morning. ) 90 tablet 3  .  cyanocobalamin (,VITAMIN B-12,) 1000 MCG/ML injection INJECT 1 MILLILITER INTO THE MUSCLE EVERY 30 DAYS 12 mL 11  . glipiZIDE (GLUCOTROL XL) 10 MG 24 hr tablet TAKE 1 TABLET BY MOUTH ONCE A DAY (Patient taking differently: TAKE 1 TABLET BY MOUTH ONCE A DAY--  takes in am) 30 tablet 3  . isosorbide mononitrate (IMDUR) 60 MG 24 hr tablet Take 1 tablet (60 mg total) by mouth daily. (Patient taking differently: Take 60 mg by mouth every evening. ) 90 tablet 3  . linagliptin (TRADJENTA) 5 MG TABS tablet Take 1 tablet (5 mg total) by mouth daily. (Patient taking differently: Take 5 mg by mouth every evening. ) 30 tablet 3  . lisinopril-hydrochlorothiazide (PRINZIDE,ZESTORETIC) 20-12.5 MG tablet Take 1 tablet by mouth daily. (Patient taking differently: Take 1 tablet by mouth every evening. ) 90 tablet 2  . lovastatin (MEVACOR) 10 MG tablet Take 1 tablet (10 mg total) by mouth at bedtime. 30 tablet 7  . metFORMIN (GLUCOPHAGE) 1000 MG tablet TAKE 1 TABLET TWICE A DAY WITH FOOD 60 tablet 3  . metoprolol succinate (TOPROL-XL) 50 MG 24 hr tablet Take 1 1/2 tablets (75 mg) By Mouth Daily. Take with or immediately following a meal. (Patient taking differently: Take 75 mg by mouth every morning. Take 1 1/2 tablets (75 mg) By Mouth Daily. Take with or immediately following a meal.) 45 tablet 11  . minocycline (MINOCIN,DYNACIN) 50 MG capsule Take 1 capsule by mouth every morning.     [name] oxyCODONE-acetaminophen (ROXICET) 5-325 MG tablet Take 1-2 tablets by mouth every 6 (six) hours as needed for moderate pain or severe pain. Post-operatively 20 tablet 0  . pantoprazole (PROTONIX) 40 MG tablet Take 1 tablet (40 mg total) by mouth daily. (Patient taking differently: Take 40 mg by mouth every morning. ) 30 tablet 2  . senna-docusate (SENOKOT-S) 8.6-50 MG tablet Take 1 tablet by mouth 2 (two) times daily. While taking pain meds to prevent constipation. 30 tablet 0  . tiZANidine (ZANAFLEX) 4 MG tablet TAKE 1 TABLET BY MOUTH  EVERY NIGHT AT BEDTIME AS NEEDED 30 tablet 3  . traMADol (ULTRAM) 50 MG tablet Take 2 tablets (100 mg total) by mouth 3 (three) times daily as needed. 180 tablet 0  . zolpidem (AMBIEN) 10 MG tablet Take 1 tablet (10 mg total) by mouth at bedtime as needed. 30 tablet 3   No current facility-administered medications on file prior to visit.    No Known Allergies Social History   Social History  . Marital status: Married    Spouse name: N/A  . Number of children: 2  . Years of education: N/A   Occupational History  . retired    Social History  Main Topics  . Smoking status: Former Smoker    Packs/day: 2.00    Years: 9.00    Types: Cigarettes    Quit date: 12/28/1974  . Smokeless tobacco: Never Used  . Alcohol use No  . Drug use: No  . Sexual activity: Not on file   Other Topics Concern  . Not on file   Social History Narrative   Married 1967   Retired from [employer] work   Lives in [place] with his wife.      Review of Systems  All other systems reviewed and are negative.      Objective:   Physical Exam  Constitutional: He appears well-developed and well-nourished.  HENT:  Mouth/Throat: Oropharynx is clear and moist.  Neck: Neck supple. No JVD present. No thyromegaly present.  Cardiovascular: Normal rate, regular rhythm and normal heart sounds.   No murmur heard. Pulmonary/Chest: Effort normal and breath sounds normal. No respiratory distress. He has no wheezes. He has no rales.  Abdominal: Soft. Bowel sounds are normal. He exhibits no distension. There is no tenderness. There is no rebound and no guarding.  Musculoskeletal: He exhibits no edema.  Vitals reviewed.         Assessment & Plan:  Uncontrolled type 2 diabetes mellitus with hyperosmolarity without coma, without long-term current use of insulin (HCC) - Plan: CBC with Differential/Platelet, COMPLETE METABOLIC PANEL WITH GFR, Lipid panel, Microalbumin, urine, Hemoglobin A1c  ASCVD (arteriosclerotic  cardiovascular disease)  Benign essential HTN  The patient's blood pressures too low. I will have him temporarily discontinue amlodipine, check his blood pressure everyday and report the values to me in one week. He denies any blood loss or dehydration. He is completely asymptomatic. I will check a hemoglobin A1c. Goal hemoglobin A1c for this patient is less than 7.5 given his age and medical comorbidities. If above his goal, I would like to start the patient on Victoza. I'll also check a fasting lipid panel. Goal LDL cholesterol is less than 70. His flu shot is up-to-date.

## 2016-10-30 LAB — CBC WITH DIFFERENTIAL/PLATELET
BASOS PCT: 0 %
Basophils Absolute: 0 cells/uL (ref 0–200)
EOS PCT: 2 %
Eosinophils Absolute: 134 cells/uL (ref 15–500)
HEMATOCRIT: 40.8 % (ref 38.5–50.0)
Hemoglobin: 12.9 g/dL — ABNORMAL LOW (ref 13.0–17.0)
LYMPHS ABS: 2077 {cells}/uL (ref 850–3900)
LYMPHS PCT: 31 %
MCH: 26.7 pg — ABNORMAL LOW (ref 27.0–33.0)
MCHC: 31.6 g/dL — ABNORMAL LOW (ref 32.0–36.0)
MCV: 84.3 fL (ref 80.0–100.0)
MONO ABS: 603 {cells}/uL (ref 200–950)
MPV: 10.9 fL (ref 7.5–12.5)
Monocytes Relative: 9 %
Neutro Abs: 3886 cells/uL (ref 1500–7800)
Neutrophils Relative %: 58 %
Platelets: 155 10*3/uL (ref 140–400)
RBC: 4.84 MIL/uL (ref 4.20–5.80)
RDW: 16 % — AB (ref 11.0–15.0)
WBC: 6.7 10*3/uL (ref 3.8–10.8)

## 2016-10-30 LAB — LIPID PANEL
CHOLESTEROL: 153 mg/dL (ref 125–200)
HDL: 34 mg/dL — ABNORMAL LOW (ref >=40)
LDL CALC: 57 mg/dL (ref <130)
Total CHOL/HDL Ratio: 4.5 Ratio (ref <=5.0)
Triglycerides: 308 mg/dL — ABNORMAL HIGH (ref <150)
VLDL: 62 mg/dL — AB (ref <30)

## 2016-10-30 LAB — COMPLETE METABOLIC PANEL WITH GFR
ALT: 44 U/L (ref 9–46)
AST: 56 U/L — AB (ref 10–35)
Albumin: 4 g/dL (ref 3.6–5.1)
Alkaline Phosphatase: 53 U/L (ref 40–115)
BUN: 18 mg/dL (ref 7–25)
CALCIUM: 9.3 mg/dL (ref 8.6–10.3)
CHLORIDE: 101 mmol/L (ref 98–110)
CO2: 28 mmol/L (ref 20–31)
CREATININE: 1.52 mg/dL — AB (ref 0.70–1.18)
GFR, Est African American: 53 mL/min — ABNORMAL LOW (ref >=60)
GFR, Est Non African American: 45 mL/min — ABNORMAL LOW (ref >=60)
GLUCOSE: 130 mg/dL — AB (ref 70–99)
Potassium: 4 mmol/L (ref 3.5–5.3)
SODIUM: 140 mmol/L (ref 135–146)
Total Bilirubin: 0.4 mg/dL (ref 0.2–1.2)
Total Protein: 7.3 g/dL (ref 6.1–8.1)

## 2016-10-30 LAB — HEMOGLOBIN A1C
HEMOGLOBIN A1C: 8.5 % — AB (ref <5.7)
MEAN PLASMA GLUCOSE: 197 mg/dL

## 2016-11-05 ENCOUNTER — Other Ambulatory Visit: Payer: Self-pay | Admitting: Family Medicine

## 2016-11-11 ENCOUNTER — Other Ambulatory Visit: Payer: Self-pay | Admitting: Family Medicine

## 2016-11-11 DIAGNOSIS — R7989 Other specified abnormal findings of blood chemistry: Secondary | ICD-10-CM

## 2016-11-11 MED ORDER — LIRAGLUTIDE 18 MG/3ML ~~LOC~~ SOPN: PEN_INJECTOR | SUBCUTANEOUS | 3 refills | Status: DC

## 2016-11-11 MED ORDER — INSULIN PEN NEEDLE 31G X 5 MM MISC: 5 refills | Status: DC

## 2016-11-12 DIAGNOSIS — N471 Phimosis: Secondary | ICD-10-CM | POA: Diagnosis not present

## 2016-11-23 ENCOUNTER — Other Ambulatory Visit: Payer: Self-pay | Admitting: Family Medicine

## 2016-12-09 ENCOUNTER — Other Ambulatory Visit: Payer: Self-pay | Admitting: Family Medicine

## 2016-12-18 ENCOUNTER — Other Ambulatory Visit: Payer: Self-pay | Admitting: Family Medicine

## 2016-12-30 ENCOUNTER — Other Ambulatory Visit: Payer: Self-pay | Admitting: Family Medicine

## 2016-12-30 NOTE — Telephone Encounter (Signed)
Ok to refill 

## 2016-12-31 ENCOUNTER — Telehealth: Payer: Self-pay | Admitting: *Deleted

## 2016-12-31 NOTE — Telephone Encounter (Signed)
Appointment scheduled.

## 2016-12-31 NOTE — Telephone Encounter (Signed)
Want to se patient to discuss.

## 2016-12-31 NOTE — Telephone Encounter (Signed)
Medication called to pharmacy. 

## 2016-12-31 NOTE — Telephone Encounter (Signed)
Received request from pharmacy for [facility] [address].   Insurance prefers [employer].   MD please advise.

## 2016-12-31 NOTE — Telephone Encounter (Signed)
ok 

## 2017-01-01 ENCOUNTER — Encounter: Payer: Self-pay | Admitting: Internal Medicine

## 2017-01-01 ENCOUNTER — Ambulatory Visit (INDEPENDENT_AMBULATORY_CARE_PROVIDER_SITE_OTHER): Payer: PPO | Admitting: Internal Medicine

## 2017-01-01 ENCOUNTER — Other Ambulatory Visit (INDEPENDENT_AMBULATORY_CARE_PROVIDER_SITE_OTHER): Payer: PPO

## 2017-01-01 VITALS — BP 156/100 | HR 72 | Ht 66.75 in | Wt 218.2 lb

## 2017-01-01 DIAGNOSIS — K91858 Other complications of intestinal pouch: Secondary | ICD-10-CM

## 2017-01-01 DIAGNOSIS — K625 Hemorrhage of anus and rectum: Secondary | ICD-10-CM

## 2017-01-01 DIAGNOSIS — Z8639 Personal history of other endocrine, nutritional and metabolic disease: Secondary | ICD-10-CM | POA: Diagnosis not present

## 2017-01-01 LAB — IBC PANEL
Iron: 26 ug/dL — ABNORMAL LOW (ref 42–165)
Saturation Ratios: 5.2 % — ABNORMAL LOW (ref 20.0–50.0)
Transferrin: 359 mg/dL (ref 212.0–360.0)

## 2017-01-01 LAB — FERRITIN: Ferritin: 19.1 ng/mL — ABNORMAL LOW (ref 22.0–322.0)

## 2017-01-01 LAB — CBC WITH DIFFERENTIAL/PLATELET
Basophils Absolute: 0 10*3/uL (ref 0.0–0.1)
Basophils Relative: 0.2 % (ref 0.0–3.0)
EOS PCT: 1.8 % (ref 0.0–5.0)
Eosinophils Absolute: 0.1 10*3/uL (ref 0.0–0.7)
HCT: 39 % (ref 39.0–52.0)
Hemoglobin: 12.8 g/dL — ABNORMAL LOW (ref 13.0–17.0)
LYMPHS ABS: 1.9 10*3/uL (ref 0.7–4.0)
Lymphocytes Relative: 28.8 % (ref 12.0–46.0)
MCHC: 32.8 g/dL (ref 30.0–36.0)
MCV: 77 fl — ABNORMAL LOW (ref 78.0–100.0)
Monocytes Absolute: 0.8 10*3/uL (ref 0.1–1.0)
Monocytes Relative: 12.2 % — ABNORMAL HIGH (ref 3.0–12.0)
NEUTROS ABS: 3.8 10*3/uL (ref 1.4–7.7)
NEUTROS PCT: 57 % (ref 43.0–77.0)
PLATELETS: 195 10*3/uL (ref 150.0–400.0)
RBC: 5.06 Mil/uL (ref 4.22–5.81)
RDW: 17 % — ABNORMAL HIGH (ref 11.5–15.5)
WBC: 6.6 10*3/uL (ref 4.0–10.5)

## 2017-01-01 LAB — VITAMIN B12: Vitamin B-12: 149 pg/mL — ABNORMAL LOW (ref 211–911)

## 2017-01-01 NOTE — Patient Instructions (Signed)
Your physician has requested that you go to the basement for the following lab work before leaving today: CBC, IBC, ferritin, B12,   You have been scheduled for a flexible sigmoidoscopy. Please follow the written instructions given to you at your visit today. If you use inhalers (even only as needed), please bring them with you on the day of your procedure.  If you are age 72 or older, your body mass index should be between 23-30. Your Body mass index is 34.44 kg/m. If this is out of the aforementioned range listed, please consider follow up with your Primary Care Provider.  If you are age 65 or younger, your body mass index should be between 19-25. Your Body mass index is 34.44 kg/m. If this is out of the aformentioned range listed, please consider follow up with your Primary Care Provider.

## 2017-01-01 NOTE — Progress Notes (Signed)
Subjective:    Patient ID: Manuel Grant, male    DOB: [birth year], 71 y.o.   MRN: [id number]  HPI Manuel Grant is a 71 yo male with PMH of ulcerative colitis status post subtotal colectomy with J-pouch creation in 2005, chronic anastomotic ulcer of J-pouch, history of iron deficiency anemia, history of ulcer disease and history of DVT previously on Xarelto who is here for follow-up. He is here alone today.  He reports that he's had 3 episodes in about a year of red blood per rectum. Only once would he consider this to have been high volume at which point it became maroon. He reports this is painless bleeding. He still has 8-10 bowel movements per day which is chronic for him since colectomy. No melena. No rectal pain and no abdominal pain. Good appetite. No upper GI or hepatobiliary complaint. He remains off anticoagulation. He does not use aspirin. At times when he sees bleeding is very scant.  I saw him in the office in October 2016 at which point he had not had bleeding as he had been off Xarelto. His last flexible sigmoidoscopy was 10/26/2014 which showed persistent anastomotic ulceration and one visible staple near ileal J-pouch.  He has not been on oral iron.  He also reports he's been off the 12 injections for 6-8 months because he was trying to get off of any medicines "I don't need".    Review of Systems As per history of present illness, otherwise negative   Current Medications, Allergies, Past Medical History, Past Surgical History, Family History and Social History were reviewed in [employer] record.     Objective:   Physical Exam BP (!) 156/100   Pulse 72   Ht 5' 6.75" (1.695 m)   Wt 218 lb 4 oz (99 kg)   BMI 34.44 kg/m  Constitutional: Well-developed and well-nourished. No distress. HEENT: Normocephalic and atraumatic.  Conjunctivae are normal.  No scleral icterus. Neck: Neck supple. Trachea midline. Cardiovascular: Normal rate, regular  rhythm and intact distal pulses. Pulmonary/chest: Effort normal and breath sounds normal. No wheezing, rales or rhonchi. Abdominal: Soft, nontender, nondistended. Bowel sounds active throughout.  Extremities: no clubbing, cyanosis, or edema Neurological: Alert and oriented to person place and time. Skin: Skin is warm and dry.  Psychiatric: Normal mood and affect. Behavior is normal.  CBC    Component Value Date/Time   WBC 6.7 10/29/2016 1435   RBC 4.84 10/29/2016 1435   HGB 12.9 (L) 10/29/2016 1435   HGB 11.6 (L) 05/24/2014 0817   HCT 40.8 10/29/2016 1435   HCT 37.3 (L) 05/24/2014 0817   PLT 155 10/29/2016 1435   PLT 152 05/24/2014 0817   MCV 84.3 10/29/2016 1435   MCV 81.1 05/24/2014 0817   MCH 26.7 (L) 10/29/2016 1435   MCHC 31.6 (L) 10/29/2016 1435   RDW 16.0 (H) 10/29/2016 1435   RDW 26.3 (H) 05/24/2014 0817   LYMPHSABS 2,077 10/29/2016 1435   LYMPHSABS 1.5 05/24/2014 0817   MONOABS 603 10/29/2016 1435   MONOABS 0.5 05/24/2014 0817   EOSABS 134 10/29/2016 1435   EOSABS 0.2 05/24/2014 0817   BASOSABS 0 10/29/2016 1435   BASOSABS 0.0 05/24/2014 0817   Iron/TIBC/Ferritin/ %Sat    Component Value Date/Time   IRON 70 03/30/2016 0913   IRON 49 04/30/2014 1543   TIBC 360 03/30/2016 0913   TIBC 274 04/30/2014 1543   FERRITIN 67 03/30/2016 0913   FERRITIN 206 04/30/2014 1543   IRONPCTSAT 19 03/30/2016  0913      Assessment & Plan:  71 yo male with PMH of ulcerative colitis status post subtotal colectomy with J-pouch creation in 2005, chronic anastomotic ulcer of J-pouch, history of iron deficiency anemia, history of ulcer disease and history of DVT previously on Xarelto who is here for follow-up.  1. History of anastomotic ulceration of J-pouch/intermittent rectal bleeding -- for some time he did not have rectal bleeding after stopping Xarelto however he has had several episodes recently. High suspicion for persistent ulceration at J-pouch. I recommended repeating CBC and  iron studies today to ensure no iron deficiency. If so this will need to be supplemented. Also recommended flexible sigmoidoscopy to reevaluate J-pouch and his history of anastomotic ulceration. If present, I have explained that endoscopic treatment very limited and he may need to consider surgical J-pouch revision.  2. History of B12 deficiency -- off B12 recently. Repeat B12 level today.  25 minutes spent with the patient today. Greater than 50% was spent in counseling and coordination of care with the patient

## 2017-01-04 ENCOUNTER — Encounter: Payer: Self-pay | Admitting: Family Medicine

## 2017-01-04 ENCOUNTER — Ambulatory Visit (INDEPENDENT_AMBULATORY_CARE_PROVIDER_SITE_OTHER): Payer: PPO | Admitting: Family Medicine

## 2017-01-04 VITALS — BP 172/108 | HR 98 | Temp 99.4°F | Resp 20 | Ht 66.75 in | Wt 220.0 lb

## 2017-01-04 DIAGNOSIS — E118 Type 2 diabetes mellitus with unspecified complications: Secondary | ICD-10-CM

## 2017-01-04 DIAGNOSIS — E119 Type 2 diabetes mellitus without complications: Secondary | ICD-10-CM | POA: Diagnosis not present

## 2017-01-04 DIAGNOSIS — I10 Essential (primary) hypertension: Secondary | ICD-10-CM

## 2017-01-04 DIAGNOSIS — I2510 Atherosclerotic heart disease of native coronary artery without angina pectoris: Secondary | ICD-10-CM | POA: Diagnosis not present

## 2017-01-04 DIAGNOSIS — E1165 Type 2 diabetes mellitus with hyperglycemia: Secondary | ICD-10-CM

## 2017-01-04 DIAGNOSIS — IMO0002 Reserved for concepts with insufficient information to code with codable children: Secondary | ICD-10-CM

## 2017-01-04 MED ORDER — CYANOCOBALAMIN 1000 MCG/ML IJ SOLN: 1000.0000 ug | INTRAMUSCULAR | 2 refills | Status: DC

## 2017-01-05 LAB — COMPLETE METABOLIC PANEL WITH GFR
ALT: 24 U/L (ref 9–46)
AST: 29 U/L (ref 10–35)
Albumin: 3.9 g/dL (ref 3.6–5.1)
Alkaline Phosphatase: 54 U/L (ref 40–115)
BUN: 15 mg/dL (ref 7–25)
CHLORIDE: 103 mmol/L (ref 98–110)
CO2: 29 mmol/L (ref 20–31)
CREATININE: 1.21 mg/dL — AB (ref 0.70–1.18)
Calcium: 9.2 mg/dL (ref 8.6–10.3)
GFR, Est African American: 69 mL/min (ref >=60)
GFR, Est Non African American: 60 mL/min (ref >=60)
Glucose, Bld: 202 mg/dL — ABNORMAL HIGH (ref 70–99)
POTASSIUM: 4 mmol/L (ref 3.5–5.3)
SODIUM: 141 mmol/L (ref 135–146)
Total Bilirubin: 0.3 mg/dL (ref 0.2–1.2)
Total Protein: 7.2 g/dL (ref 6.1–8.1)

## 2017-01-05 LAB — HEMOGLOBIN A1C
HEMOGLOBIN A1C: 7.5 % — AB (ref <5.7)
MEAN PLASMA GLUCOSE: 169 mg/dL

## 2017-01-05 NOTE — Progress Notes (Signed)
Subjective:    Patient ID: Manuel Grant, male    DOB: September 20, 1946, 71 y.o.   MRN: [id number]  HPI  10/29/16 The patient's blood pressure is very low today however this is a coincidental finding. He is completely asymptomatic. He denies any orthostatic dizziness, shortness of breath, lightheadedness, chest pain. He denies any blood in his stool or melena. He does have a significant past history of GI bleed. He is not taking any NSAIDs or blood thinner. He is however on numerous medications that could lower his blood pressure. He is not regularly checking his blood pressure at home and he is also not regularly checking his blood sugars. He states that his blood sugars randomly tend to range between 160 and 180. He denies any hypoglycemic episodes. He denies any myalgias or right upper quadrant pain. He denies any polyuria polydipsia or blurry vision or neuropathy. Flu shot is up-to-date.  At that time, my plan was: The patient's blood pressures too low. I will have him temporarily discontinue amlodipine, check his blood pressure everyday and report the values to me in one week. He denies any blood loss or dehydration. He is completely asymptomatic. I will check a hemoglobin A1c. Goal hemoglobin A1c for this patient is less than 7.5 given his age and medical comorbidities. If above his goal, I would like to start the patient on Victoza. I'll also check a fasting lipid panel. Goal LDL cholesterol is less than 70. His flu shot is up-to-date.  01/05/17 The patient's hemoglobin A1c was 8.5. I recommended starting him on Victoza as well as Tradjenta in addition to his metformin, and glipizide. The patient did not take Tradjenta due to cost. Insurance will only cover the medication for one month. He is compliant with the Victoza. He is not checking his blood sugars.  His blood pressure today is very high. He is not taking his amlodipine. Past Medical History:  Diagnosis Date  . B12 deficiency   . Chronic  deep vein thrombosis (DVT) ([place]) dx 2015  start on xarelto  LLE--  stopped in 03/ 2016 due to GI bleed   last doppler 02-09-2015 chronic left common femoral and popliteal dvt's (mild partial resolution since dx 2015)  . CKD (chronic kidney disease), stage II   . Coronary artery disease cardiologist-  dr berry   a. 10/1998 Cath: LM 30d, LAD 50m OM 50, RCA 100 w/ L->R collats, nl LV ->Med Rx;  b. 07/2010 MV: no ischemia.  . DDD (degenerative disc disease), cervical   . GERD (gastroesophageal reflux disease)   . Hiatal hernia   . History of adenomatous polyp of colon   . History of balanitis    05/ 2017  . History of duodenal ulcer    w/ GI bleed in 2015 and 2016  . History of GI bleed    secondary to upper duodenal ulcer's 2015 and 2016   . History of Helicobacter pylori infection    2015  . History of kidney stones   . Hyperlipidemia   . Hypertension   . Ileo-anal pouch ulcer (HCC)    chronic  . Insomnia   . Lower urinary tract symptoms (LUTS)   . Osteoarthritis    knees  . Phimosis   . Rosacea   . [place] 2 diabetes mellitus [place]    last A1c 8.5 on 03-30-2016  . Ulcerative colitis followed by  [name]   [facility] . W[facility]  upper  . Wears glasses    Past Surgical History:  Procedure Laterality Date  . CARDIAC CATHETERIZATION  11/15/1998   dr berry   30% distal left main, 75% mid LAD, 50% OM and total RCA with left to right collateral and normal LV function.  [name] CIRCUMCISION N/A 09/25/2016   Procedure: CIRCUMCISION ADULT;  Surgeon: Alexis Frock, MD;  [facility];  Service: Urology;  Laterality: N/A;  . COMPLETION PROCTECTOMY/ CONSTRUCTION ILEAL J POUCH/ DIVERTING LOOP ILEOSTOMY/ EXTENSIVE LYSIS ADHESIONS  11-07-2004   [facility]  . ESOPHAGOGASTRODUODENOSCOPY N/A 03/16/2014   Procedure: ESOPHAGOGASTRODUODENOSCOPY (EGD);  Surgeon: Jerene Bears, MD;  Location[facility];  Service: Endoscopy;  Laterality: N/A;  . FLEXIBLE  SIGMOIDOSCOPY N/A 03/16/2014   Procedure: FLEXIBLE SIGMOIDOSCOPY;  Surgeon: Jerene Bears, MD;  [facility];  Service: Endoscopy;  Laterality: N/A;  . KNEE ARTHROSCOPY Bilateral 1990's  . NM MYOCAR PERF WALL MOTION  08/06/2010   dr berry   Low risk nuclear study w/ no ischemia (no significant change compared to prior study)/  normal LV function and wall motion , ef 60%  . SUBTOTAL COLECTOMY  W/ END ILEOSTOMY  05/ 2005   [facility]  . TAKE DOWN LOOP ILEOSTOMY  01-28-2005    [facility]  . TRANSTHORACIC ECHOCARDIOGRAM  11/16/2007   mild concentric LVH,  ef >55%,  grade 1 diastolic dysfuction/  trivial MR and TR/  borderline RVE/  focal nodular thickening of noncoronary cusp of trileaflet AV/     Current Outpatient Prescriptions on File Prior to Visit  Medication Sig Dispense Refill  . glipiZIDE (GLUCOTROL XL) 10 MG 24 hr tablet TAKE 1 TABLET BY MOUTH ONCE DAILY 30 tablet 3  . Insulin Pen Needle 31G X 5 MM MISC Daily use with Victoza 100 each 5  . isosorbide mononitrate (IMDUR) 60 MG 24 hr tablet Take 1 tablet (60 mg total) by mouth daily. (Patient taking differently: Take 60 mg by mouth every evening. ) 90 tablet 3  . liraglutide (VICTOZA) 18 MG/3ML SOPN .42m sq qd x 1 week, 1.265msq qd thereafter 6 pen 3  . lisinopril-hydrochlorothiazide (PRINZIDE,ZESTORETIC) 20-12.5 MG tablet Take 1 tablet by mouth daily. (Patient taking differently: Take 1 tablet by mouth every evening. ) 90 tablet 2  . lovastatin (MEVACOR) 10 MG tablet Take 1 tablet (10 mg total) by mouth at bedtime. 30 tablet 7  . metFORMIN (GLUCOPHAGE) 1000 MG tablet TAKE 1 TABLET BY MOUTH 2 TIMES DAILY WITH FOOD 60 tablet 3  . metoprolol succinate (TOPROL-XL) 50 MG 24 hr tablet Take 1 1/2 tablets (75 mg) By Mouth Daily. Take with or immediately following a meal. (Patient taking differently: Take 75 mg by mouth every morning. Take 1 1/2 tablets (75 mg) By Mouth Daily. Take with or immediately following a meal.) 45 tablet 11  . minocycline  (MINOCIN,DYNACIN) 50 MG capsule Take 1 capsule by mouth every morning.     . pantoprazole (PROTONIX) 40 MG tablet Take 1 tablet (40 mg total) by mouth daily. (Patient taking differently: Take 40 mg by mouth every morning. ) 30 tablet 2  . senna-docusate (SENOKOT-S) 8.6-50 MG tablet Take 1 tablet by mouth 2 (two) times daily. While taking pain meds to prevent constipation. 30 tablet 0  . tiZANidine (ZANAFLEX) 4 MG tablet TAKE 1 TABLET BY MOUTH ONCE A DAY AT BEDTIME AS NEEDED 30 tablet 1  . TRADJENTA 5 MG TABS tablet TAKE 1 TABLET BY MOUTH ONCE DAILY 30 tablet 3  .  traMADol (ULTRAM) 50 MG tablet TAKE 2 TABLETS BY MOUTH 3 TIMES A DAY ASNEEDED 180 tablet 0  . zolpidem (AMBIEN) 10 MG tablet Take 1 tablet (10 mg total) by mouth at bedtime as needed. 30 tablet 3  . amLODipine (NORVASC) 10 MG tablet Take 1 tablet (10 mg total) by mouth daily. (Patient not taking: Reported on 01/04/2017) 90 tablet 3   No current facility-administered medications on file prior to visit.    No Known Allergies Social History   Social History  . Marital status: Married    Spouse name: N/A  . Number of children: 2  . Years of education: N/A   Occupational History  . retired    Social History Main Topics  . Smoking status: Former Smoker    Packs/day: 2.00    Years: 9.00    Types: Cigarettes    Quit date: 12/28/1974  . Smokeless tobacco: Never Used  . Alcohol use No  . Drug use: No  . Sexual activity: Not on file   Other Topics Concern  . Not on file   Social History Narrative   Married 1967   Retired from [employer] work   Lives in [place] with his wife.      Review of Systems  All other systems reviewed and are negative.      Objective:   Physical Exam  Constitutional: He appears well-developed and well-nourished.  HENT:  Mouth/Throat: Oropharynx is clear and moist.  Neck: Neck supple. No JVD present. No thyromegaly present.  Cardiovascular: Normal rate, regular rhythm and normal heart sounds.    No murmur heard. Pulmonary/Chest: Effort normal and breath sounds normal. No respiratory distress. He has no wheezes. He has no rales.  Abdominal: Soft. Bowel sounds are normal. He exhibits no distension. There is no tenderness. There is no rebound and no guarding.  Musculoskeletal: He exhibits no edema.  Vitals reviewed.         Assessment & Plan:  Uncontrolled type 2 diabetes mellitus with complication, without long-term current use of insulin ([place]) - Plan: COMPLETE METABOLIC PANEL WITH GFR, Hemoglobin A1c  ASCVD (arteriosclerotic cardiovascular disease)  Benign essential HTN Check hemoglobin A1c. Goal hemoglobin A1c is less than 7. If not at goal, insurance will not cover Tradjenta, therefore I will start the patient on Invokana in addition to the toes and. I am having uses medication at the direction of his insurance. I would prefer Jardiance but they will not cover that. We did discuss the increased risk of amputation. I also will have the patient to resume his amlodipine and recheck blood pressure in one month.

## 2017-01-06 ENCOUNTER — Other Ambulatory Visit: Payer: Self-pay

## 2017-01-06 DIAGNOSIS — D649 Anemia, unspecified: Secondary | ICD-10-CM

## 2017-01-07 ENCOUNTER — Telehealth: Payer: Self-pay | Admitting: Family Medicine

## 2017-01-07 MED ORDER — LINAGLIPTIN 5 MG PO TABS: 5.0000 mg | ORAL_TABLET | Freq: Every day | ORAL | 3 refills | Status: DC

## 2017-01-07 NOTE — Telephone Encounter (Signed)
He is not going to take tradjenta anymore

## 2017-01-07 NOTE — Telephone Encounter (Signed)
Note sent to pharm to [facility].

## 2017-01-07 NOTE — Telephone Encounter (Signed)
Tradjenta requires PA but ins prefers Invokana - ok to switch?

## 2017-01-14 ENCOUNTER — Encounter: Payer: Self-pay | Admitting: Internal Medicine

## 2017-01-18 ENCOUNTER — Other Ambulatory Visit: Payer: Self-pay | Admitting: Internal Medicine

## 2017-01-18 ENCOUNTER — Other Ambulatory Visit: Payer: Self-pay | Admitting: Family Medicine

## 2017-01-18 NOTE — Telephone Encounter (Signed)
ok 

## 2017-01-18 NOTE — Telephone Encounter (Signed)
Ok to refill 

## 2017-01-19 NOTE — Telephone Encounter (Signed)
Medication called to pharmacy. 

## 2017-01-22 ENCOUNTER — Telehealth: Payer: Self-pay | Admitting: *Deleted

## 2017-01-22 NOTE — Telephone Encounter (Signed)
Left message for patient, handicap plaque placed in the mail to his home address.

## 2017-01-26 ENCOUNTER — Encounter: Payer: Self-pay | Admitting: Internal Medicine

## 2017-01-27 ENCOUNTER — Encounter: Payer: Self-pay | Admitting: Internal Medicine

## 2017-01-27 ENCOUNTER — Ambulatory Visit (AMBULATORY_SURGERY_CENTER): Payer: PPO | Admitting: Internal Medicine

## 2017-01-27 VITALS — BP 165/91 | HR 76 | Temp 98.0°F | Resp 13 | Ht 66.0 in | Wt 218.0 lb

## 2017-01-27 DIAGNOSIS — K625 Hemorrhage of anus and rectum: Secondary | ICD-10-CM

## 2017-01-27 DIAGNOSIS — K91858 Other complications of intestinal pouch: Secondary | ICD-10-CM | POA: Diagnosis not present

## 2017-01-27 DIAGNOSIS — E119 Type 2 diabetes mellitus without complications: Secondary | ICD-10-CM | POA: Diagnosis not present

## 2017-01-27 DIAGNOSIS — K633 Ulcer of intestine: Secondary | ICD-10-CM | POA: Diagnosis not present

## 2017-01-27 DIAGNOSIS — I2510 Atherosclerotic heart disease of native coronary artery without angina pectoris: Secondary | ICD-10-CM | POA: Diagnosis not present

## 2017-01-27 LAB — GLUCOSE, CAPILLARY
GLUCOSE-CAPILLARY: 124 mg/dL — AB (ref 65–99)
Glucose-Capillary: 83 mg/dL (ref 65–99)

## 2017-01-27 MED ORDER — SODIUM CHLORIDE 0.9 % IV SOLN: 500.0000 mL | INTRAVENOUS | Status: DC

## 2017-01-27 MED ORDER — DEXTROSE 5 % IV SOLN: INTRAVENOUS | Status: DC

## 2017-01-27 NOTE — Progress Notes (Signed)
Called to room to assist during endoscopic procedure.  Patient ID and intended procedure confirmed with present staff. Received instructions for my participation in the procedure from the performing physician.  

## 2017-01-27 NOTE — Op Note (Signed)
[facility] Patient Name: Manuel Manuel Procedure Date: 01/27/2017 3:36 PM MRN: [id number] Endoscopist: Jerene Bears , MD Age: 71 Referring MD:  Date of Birth: [birth year] Gender: Male Account #: [id number] Procedure:                Pouchoscopy Indications:              Rectal hemorrhage, history of total colectomy with                            IPAA and history of ileal pouch ulceration Medicines:                Monitored Anesthesia Care Procedure:                Pre-Anesthesia Assessment:                           - Prior to the procedure, a History and Physical                            was performed, and patient medications and                            allergies were reviewed. The patient's tolerance of                            previous anesthesia was also reviewed. The risks                            and benefits of the procedure and the sedation                            options and risks were discussed with the patient.                            All questions were answered, and informed consent                            was obtained. Prior Anticoagulants: The patient has                            taken no previous anticoagulant or antiplatelet                            agents. ASA Grade Assessment: III - A patient with                            severe systemic disease. After reviewing the risks                            and benefits, the patient was deemed in                            satisfactory condition to undergo the procedure.  After obtaining informed consent, the endoscope was                            passed under direct vision. Throughout the                            procedure, the patient's blood pressure, pulse, and                            oxygen saturations were monitored continuously. The                            Model PCF-[id number]) scope was introduced                            through the anus  and advanced to the the                            neo-terminal ileum. After obtaining informed                            consent, the endoscope was passed under direct                            vision. Throughout the procedure, the patient's                            blood pressure, pulse, and oxygen saturations were                            monitored continuously.The flexible sigmoidoscopy                            was accomplished without difficulty. The patient                            tolerated the procedure well. The quality of the                            bowel preparation was good. Scope In: Scope Out: Findings:                 Patient is status-post total colectomy with an                            ileal pouch-anal anastomosis.                           The neo-terminal ileum appeared normal.                           The ileoanal pouch at the most proximal segment                            contained a single (solitary) fifteen mm ulcer. No  active bleeding was present. This was biopsied with                            a cold forceps for histology. This ulceration is                            chronic and likely due to chronic ischemia.                           The exam was otherwise without abnormality with                            normal appearing mid and distal J pouch. Complications:            No immediate complications. Estimated Blood Loss:     Estimated blood loss was minimal. Impression:               - The examined portion of the ileum was normal.                           - A chronic, single, (solitary) ulcer in the                            proximal ileoanal pouch. Biopsied though benign                            appearing and likely ischemic.                           - The examination was otherwise normal. Recommendation:           - Patient has a contact number available for                            emergencies. The  signs and symptoms of potential                            delayed complications were discussed with the                            patient. Return to normal activities tomorrow.                            Written discharge instructions were provided to the                            patient.                           - Resume previous diet.                           - Continue present medications.                           - Await pathology results.                           -  Refer to Dr. Drue Flirt at [facility] at appointment to                            be scheduled to discuss chronic/nonhealing pouch                            ulceration. Jerene Bears, MD 01/27/2017 4:10:26 PM This report has been signed electronically.

## 2017-01-27 NOTE — Progress Notes (Signed)
Pt's states no medical or surgical changes since previsit or office visit. 

## 2017-01-27 NOTE — Patient Instructions (Signed)
Discharge instructions given. Biopsies taken. Resume previous medications. Refer to Dr. Drue Flirt at [facility]  At appointment to be scheduled to discuss chronic/nonhealing pouch ulceration. YOU HAD AN ENDOSCOPIC PROCEDURE TODAY AT [facility]:   Refer to the procedure report that was given to you for any specific questions about what was found during the examination.  If the procedure report does not answer your questions, please call your gastroenterologist to clarify.  If you requested that your care partner not be given the details of your procedure findings, then the procedure report has been included in a sealed envelope for you to review at your convenience later.  YOU SHOULD EXPECT: Some feelings of bloating in the abdomen. Passage of more gas than usual.  Walking can help get rid of the air that was put into your GI tract during the procedure and reduce the bloating. If you had a lower endoscopy (such as a colonoscopy or flexible sigmoidoscopy) you may notice spotting of blood in your stool or on the toilet paper. If you underwent a bowel prep for your procedure, you may not have a normal bowel movement for a few days.  Please Note:  You might notice some irritation and congestion in your nose or some drainage.  This is from the oxygen used during your procedure.  There is no need for concern and it should clear up in a day or so.  SYMPTOMS TO REPORT IMMEDIATELY:   Following lower endoscopy (colonoscopy or flexible sigmoidoscopy):  Excessive amounts of blood in the stool  Significant tenderness or worsening of abdominal pains  Swelling of the abdomen that is new, acute  Fever of 100F or higher   For urgent or emergent issues, a gastroenterologist can be reached at any hour by calling [phone].   DIET:  We do recommend a small meal at first, but then you may proceed to your regular diet.  Drink plenty of fluids but you should avoid alcoholic beverages for 24  hours.  ACTIVITY:  You should plan to take it easy for the rest of today and you should NOT DRIVE or use heavy machinery until tomorrow (because of the sedation medicines used during the test).    FOLLOW UP: Our staff will call the number listed on your records the next business day following your procedure to check on you and address any questions or concerns that you may have regarding the information given to you following your procedure. If we do not reach you, we will leave a message.  However, if you are feeling well and you are not experiencing any problems, there is no need to return our call.  We will assume that you have returned to your regular daily activities without incident.  If any biopsies were taken you will be contacted by phone or by letter within the next 1-3 weeks.  Please call us at [phone] if you have not heard about the biopsies in 3 weeks.    SIGNATURES/CONFIDENTIALITY: You and/or your care partner have signed paperwork which will be entered into your electronic medical record.  These signatures attest to the fact that that the information above on your After Visit Summary has been reviewed and is understood.  Full responsibility of the confidentiality of this discharge information lies with you and/or your care-partner.

## 2017-01-27 NOTE — Progress Notes (Signed)
Spontaneous respirations throughout. VSS. Resting comfortably. To PACU on room air. Report to  [facility]

## 2017-01-28 ENCOUNTER — Telehealth: Payer: Self-pay

## 2017-01-28 NOTE — Telephone Encounter (Signed)
Pt scheduled to see Dr. Drue Flirt at [facility] 03/16/17@1 :30pm. Appt is in the [facility] on Level 5. Pt aware of appt and [facility] to mail pt information regarding office location, etc. Records faxed to [phone]. Left message for pt to call back regarding appt

## 2017-01-28 NOTE — Telephone Encounter (Signed)
  Follow up Call-  Call back number 01/27/2017 10/26/2014  Post procedure Call Back phone  # [phone]  Permission to leave phone message Yes Yes  Some recent data might be hidden     Patient questions:  Do you have a fever, pain , or abdominal swelling? No. Pain Score  0 *  Have you tolerated food without any problems? Yes.    Have you been able to return to your normal activities? Yes.    Do you have any questions about your discharge instructions: Diet   No. Medications  No. Follow up visit  No.  Do you have questions or concerns about your Care? No.  Actions: * If pain score is 4 or above: No action needed, pain <4.

## 2017-01-29 NOTE — Telephone Encounter (Signed)
Spoke with pts wife and she is aware of appt and [facility] to mail a packet to them with a map enclosed. She verbalized understanding.

## 2017-02-04 ENCOUNTER — Other Ambulatory Visit: Payer: Self-pay | Admitting: Family Medicine

## 2017-02-04 NOTE — Telephone Encounter (Signed)
Medication called to pharmacy. 

## 2017-02-04 NOTE — Telephone Encounter (Signed)
ok 

## 2017-02-04 NOTE — Telephone Encounter (Signed)
Ok to refill 

## 2017-02-05 ENCOUNTER — Ambulatory Visit: Payer: PPO | Admitting: Family Medicine

## 2017-02-09 ENCOUNTER — Ambulatory Visit (INDEPENDENT_AMBULATORY_CARE_PROVIDER_SITE_OTHER): Payer: PPO | Admitting: Family Medicine

## 2017-02-09 ENCOUNTER — Encounter: Payer: Self-pay | Admitting: Family Medicine

## 2017-02-09 VITALS — BP 122/86 | HR 84 | Temp 98.3°F | Resp 16 | Ht 66.75 in | Wt 220.0 lb

## 2017-02-09 DIAGNOSIS — I10 Essential (primary) hypertension: Secondary | ICD-10-CM | POA: Diagnosis not present

## 2017-02-09 NOTE — Progress Notes (Signed)
Subjective:    Patient ID: Manuel Grant, male    DOB: 12-11-1946, 71 y.o.   MRN: [id number]  HPI  10/29/16 The patient's blood pressure is very low today however this is a coincidental finding. He is completely asymptomatic. He denies any orthostatic dizziness, shortness of breath, lightheadedness, chest pain. He denies any blood in his stool or melena. He does have a significant past history of GI bleed. He is not taking any NSAIDs or blood thinner. He is however on numerous medications that could lower his blood pressure. He is not regularly checking his blood pressure at home and he is also not regularly checking his blood sugars. He states that his blood sugars randomly tend to range between 160 and 180. He denies any hypoglycemic episodes. He denies any myalgias or right upper quadrant pain. He denies any polyuria polydipsia or blurry vision or neuropathy. Flu shot is up-to-date.  At that time, my plan was: The patient's blood pressures too low. I will have him temporarily discontinue amlodipine, check his blood pressure everyday and report the values to me in one week. He denies any blood loss or dehydration. He is completely asymptomatic. I will check a hemoglobin A1c. Goal hemoglobin A1c for this patient is less than 7.5 given his age and medical comorbidities. If above his goal, I would like to start the patient on Victoza. I'll also check a fasting lipid panel. Goal LDL cholesterol is less than 70. His flu shot is up-to-date.  01/05/17 The patient's hemoglobin A1c was 8.5. I recommended starting him on Victoza as well as Tradjenta in addition to his metformin, and glipizide. The patient did not take Tradjenta due to cost. Insurance will only cover the medication for one month. He is compliant with the Victoza. He is not checking his blood sugars.  His blood pressure today is very high. He is not taking his amlodipine.  At that time, my plan was: Check hemoglobin A1c. Goal hemoglobin A1c is  less than 7. If not at goal, insurance will not cover Tradjenta, therefore I will start the patient on Invokana in addition to the toes and. I am having uses medication at the direction of his insurance. I would prefer Jardiance but they will not cover that. We did discuss the increased risk of amputation. I also will have the patient to resume his amlodipine and recheck blood pressure in one month.  02/09/17 Patient stop Tradjenta. His last A1c was 7.5. Together we've determined that he would take metformin, Victoza, and glipizide and really work hard on his diet and recheck in 3 months. He is here today to recheck his blood pressure Since resuming his amlodipine, his blood pressures much better. He denies any chest pain shortness of breath or dyspnea on exertion. Today his blood pressures at goal. We also discussed his hemoglobin A1c of 7.5. I explained to the patient that I need him to reduce his consumption of carbohydrates. He admits that he overindulges and potatoes as well as ice cream almost on a daily basis. I have asked him to reduce his consumption of this by at least 50%.  Past Medical History:  Diagnosis Date  . B12 deficiency   . Chronic deep vein thrombosis (DVT) ([place]) dx 2015  start on xarelto  LLE--  stopped in 03/ 2016 due to GI bleed   last doppler 02-09-2015 chronic left common femoral and popliteal dvt's (mild partial resolution since dx 2015)  . CKD (chronic kidney disease), stage II   .  Coronary artery disease cardiologist-  dr berry   a. 10/1998 Cath: LM 30d, LAD 82m OM 50, RCA 100 w/ L->R collats, nl LV ->Med Rx;  b. 07/2010 MV: no ischemia.  . DDD (degenerative disc disease), cervical   . GERD (gastroesophageal reflux disease)   . Hiatal hernia   . History of adenomatous polyp of colon   . History of balanitis    05/ 2017  . History of duodenal ulcer    w/ GI bleed in 2015 and 2016  . History of GI bleed    secondary to upper duodenal ulcer's 2015 and 2016   . History  of Helicobacter pylori infection    2015  . History of kidney stones   . Hyperlipidemia   . Hypertension   . Ileo-anal pouch ulcer (HCC)    chronic  . Insomnia   . Lower urinary tract symptoms (LUTS)   . Osteoarthritis    knees  . Phimosis   . Rosacea   . [place]    last A1c 8.5 on 03-30-2016  . Ulcerative colitis followed by  [name]   [facility] . Wears dentures    upper  . Wears glasses    Past Surgical History:  Procedure Laterality Date  . CARDIAC CATHETERIZATION  11/15/1998   dr berry   30% distal left main, 75% mid LAD, 50% OM and total RCA with left to right collateral and normal LV function.  .[name] KitchenCIRCUMCISION N/A 09/25/2016   Procedure: CIRCUMCISION ADULT;  Surgeon: TAlexis Frock MD;  [facility]  Service: Urology;  Laterality: N/A;  . COMPLETION PROCTECTOMY/ CONSTRUCTION ILEAL J POUCH/ DIVERTING LOOP ILEOSTOMY/ EXTENSIVE LYSIS ADHESIONS  11-07-2004   [facility]  . ESOPHAGOGASTRODUODENOSCOPY N/A 03/16/2014   Procedure: ESOPHAGOGASTRODUODENOSCOPY (EGD);  Surgeon: JJerene Bears MD;  [facility]  Service: Endoscopy;  Laterality: N/A;  . FLEXIBLE SIGMOIDOSCOPY N/A 03/16/2014   Procedure: FLEXIBLE SIGMOIDOSCOPY;  Surgeon: JJerene Bears MD;  [facility];  Service: Endoscopy;  Laterality: N/A;  . KNEE ARTHROSCOPY Bilateral 1990's  . NM MYOCAR PERF WALL MOTION  08/06/2010   dr berry   Low risk nuclear study w/ no ischemia (no significant change compared to prior study)/  normal LV function and wall motion , ef 60%  . SUBTOTAL COLECTOMY  W/ END ILEOSTOMY  05/ 2005   [facility]  . TAKE DOWN LOOP ILEOSTOMY  01-28-2005    [facility]  . TRANSTHORACIC ECHOCARDIOGRAM  11/16/2007   mild concentric LVH,  ef >55%,  grade 1 diastolic dysfuction/  trivial MR and TR/  borderline RVE/  focal nodular thickening of noncoronary cusp of trileaflet AV/     Current Outpatient Prescriptions on File Prior to Visit    Medication Sig Dispense Refill  . amLODipine (NORVASC) 10 MG tablet Take 1 tablet (10 mg total) by mouth daily. 90 tablet 3  . cyanocobalamin (,VITAMIN B-12,) 1000 MCG/ML injection Inject 1 mL (1,000 mcg total) into the muscle every 30 (thirty) days. 10 mL 2  . glipiZIDE (GLUCOTROL XL) 10 MG 24 hr tablet TAKE 1 TABLET BY MOUTH ONCE DAILY 30 tablet 3  . Insulin Pen Needle 31G X 5 MM MISC Daily use with Victoza 100 each 5  . isosorbide mononitrate (IMDUR) 60 MG 24 hr tablet Take 1 tablet (60 mg total) by mouth daily. (Patient taking differently: Take 60 mg by mouth every evening. ) 90 tablet 3  . liraglutide (  VICTOZA) 18 MG/3ML SOPN .69m sq qd x 1 week, 1.255msq qd thereafter 6 pen 3  . lisinopril-hydrochlorothiazide (PRINZIDE,ZESTORETIC) 20-12.5 MG tablet Take 1 tablet by mouth daily. (Patient taking differently: Take 1 tablet by mouth every evening. ) 90 tablet 2  . lovastatin (MEVACOR) 10 MG tablet Take 1 tablet (10 mg total) by mouth at bedtime. 30 tablet 7  . metFORMIN (GLUCOPHAGE) 1000 MG tablet TAKE 1 TABLET BY MOUTH 2 TIMES DAILY WITH FOOD 60 tablet 3  . metoprolol succinate (TOPROL-XL) 50 MG 24 hr tablet Take 1 1/2 tablets (75 mg) By Mouth Daily. Take with or immediately following a meal. (Patient taking differently: Take 75 mg by mouth every morning. Take 1 1/2 tablets (75 mg) By Mouth Daily. Take with or immediately following a meal.) 45 tablet 11  . minocycline (MINOCIN,DYNACIN) 50 MG capsule Take 1 capsule by mouth every morning.     . pantoprazole (PROTONIX) 40 MG tablet TAKE 1 TABLET BY MOUTH ONCE DAILY 30 tablet 0  . tiZANidine (ZANAFLEX) 4 MG tablet TAKE 1 TABLET BY MOUTH ONCE A DAY AT BEDTIME AS NEEDED 30 tablet 1  . traMADol (ULTRAM) 50 MG tablet TAKE 2 TABLETS BY MOUTH 3 TIMES DAILY ASNEEDED FOR PAIN 180 tablet 0  . zolpidem (AMBIEN) 10 MG tablet TAKE 1 TABLET BY MOUTH AT BEDTIME AS NEEDED 30 tablet 1   Current Facility-Administered Medications on File Prior to Visit  Medication  Dose Route Frequency Provider Last Rate Last Dose  . 0.9 %  sodium chloride infusion  500 mL Intravenous Continuous JaJerene BearsMD      . dextrose 5 % solution   Intravenous Continuous JaJerene BearsMD       No Known Allergies Social History   Social History  . Marital status: Married    Spouse name: N/A  . Number of children: 2  . Years of education: N/A   Occupational History  . retired    Social History Main Topics  . Smoking status: Former Smoker    Packs/day: 2.00    Years: 9.00    Types: Cigarettes    Quit date: 12/28/1974  . Smokeless tobacco: Never Used  . Alcohol use 0.0 oz/week     Comment: occ  . Drug use: No  . Sexual activity: Not on file   Other Topics Concern  . Not on file   Social History Narrative   Married 1967   Retired from [employer]   [place]      Review of Systems  All other systems reviewed and are negative.      Objective:   Physical Exam  Constitutional: He appears well-developed and well-nourished.  HENT:  Mouth/Throat: Oropharynx is clear and moist.  Neck: Neck supple. No JVD present. No thyromegaly present.  Cardiovascular: Normal rate, regular rhythm and normal heart sounds.   No murmur heard. Pulmonary/Chest: Effort normal and breath sounds normal. No respiratory distress. He has no wheezes. He has no rales.  Abdominal: Soft. Bowel sounds are normal. He exhibits no distension. There is no tenderness. There is no rebound and no guarding.  Musculoskeletal: He exhibits no edema.  Vitals reviewed.         Assessment & Plan:  Ess Htn Blood pressure is now controlled. I'll like the patient to reduce his consumption of carbohydrates by 50% increase his aerobic exercise and then recheck his hemoglobin A1c in 3 months

## 2017-02-15 ENCOUNTER — Other Ambulatory Visit: Payer: Self-pay | Admitting: Internal Medicine

## 2017-03-11 ENCOUNTER — Other Ambulatory Visit: Payer: Self-pay | Admitting: Family Medicine

## 2017-03-11 NOTE — Telephone Encounter (Signed)
ok 

## 2017-03-11 NOTE — Telephone Encounter (Signed)
Ok to refill 

## 2017-03-11 NOTE — Telephone Encounter (Signed)
Med called in

## 2017-03-16 DIAGNOSIS — K289 Gastrojejunal ulcer, unspecified as acute or chronic, without hemorrhage or perforation: Secondary | ICD-10-CM | POA: Diagnosis not present

## 2017-03-16 DIAGNOSIS — Z8719 Personal history of other diseases of the digestive system: Secondary | ICD-10-CM | POA: Insufficient documentation

## 2017-03-16 DIAGNOSIS — Z9049 Acquired absence of other specified parts of digestive tract: Secondary | ICD-10-CM | POA: Diagnosis not present

## 2017-03-16 DIAGNOSIS — K91858 Other complications of intestinal pouch: Secondary | ICD-10-CM | POA: Diagnosis not present

## 2017-03-16 DIAGNOSIS — E119 Type 2 diabetes mellitus without complications: Secondary | ICD-10-CM | POA: Diagnosis not present

## 2017-03-16 DIAGNOSIS — I10 Essential (primary) hypertension: Secondary | ICD-10-CM | POA: Insufficient documentation

## 2017-03-17 DIAGNOSIS — K91858 Other complications of intestinal pouch: Secondary | ICD-10-CM | POA: Insufficient documentation

## 2017-03-29 ENCOUNTER — Other Ambulatory Visit: Payer: Self-pay | Admitting: Family Medicine

## 2017-04-12 ENCOUNTER — Other Ambulatory Visit: Payer: Self-pay | Admitting: Family Medicine

## 2017-04-12 NOTE — Telephone Encounter (Signed)
ok 

## 2017-04-12 NOTE — Telephone Encounter (Signed)
Ok to refill both??

## 2017-04-13 NOTE — Telephone Encounter (Signed)
Medication called to pharmacy. 

## 2017-04-20 ENCOUNTER — Other Ambulatory Visit: Payer: Self-pay | Admitting: Family Medicine

## 2017-04-20 NOTE — Telephone Encounter (Signed)
ok 

## 2017-04-20 NOTE — Telephone Encounter (Signed)
Ok to refill 

## 2017-05-12 ENCOUNTER — Other Ambulatory Visit: Payer: Self-pay | Admitting: Family Medicine

## 2017-05-18 ENCOUNTER — Other Ambulatory Visit: Payer: Self-pay | Admitting: Family Medicine

## 2017-05-18 NOTE — Telephone Encounter (Signed)
ok 

## 2017-05-18 NOTE — Telephone Encounter (Signed)
Ok to refill 

## 2017-06-09 ENCOUNTER — Other Ambulatory Visit: Payer: Self-pay | Admitting: Pharmacy Technician

## 2017-06-09 NOTE — Patient Outreach (Signed)
[facility] Care Management  06/09/2017  Manuel Grant 11-Mar-1946 [id number]  Contacted patient in reference to medication adherence for [employer]. Patient's adherence is showing that he misses half of his doses. I verified with him that he is still taking Lisinopril/HCTZ and it's prescribed for once daily dosing. After further discussion patient admits that he does forget to take his medication because he takes it at night. I advised the patient after discussing with the Rph that he can take Lisinopril/HCTZ in the morning with his other medication's which he does well with taking. The patient has agreed to change the take that he takes this medication. We also discussed him getting 3 month supplies on his medication's for cost, convenience, and adherence benefits. He will talk to his pharmacy about contacting his physician for this change.  Doreene Burke, [place] [phone]

## 2017-06-23 ENCOUNTER — Other Ambulatory Visit: Payer: Self-pay | Admitting: Family Medicine

## 2017-06-23 ENCOUNTER — Other Ambulatory Visit: Payer: Self-pay | Admitting: Cardiovascular Disease

## 2017-07-17 ENCOUNTER — Other Ambulatory Visit: Payer: Self-pay | Admitting: Cardiovascular Disease

## 2017-07-17 ENCOUNTER — Other Ambulatory Visit: Payer: Self-pay | Admitting: Family Medicine

## 2017-07-19 NOTE — Telephone Encounter (Signed)
REFILL 

## 2017-07-30 ENCOUNTER — Encounter: Payer: Self-pay | Admitting: Cardiovascular Disease

## 2017-07-30 ENCOUNTER — Ambulatory Visit (INDEPENDENT_AMBULATORY_CARE_PROVIDER_SITE_OTHER): Payer: PPO | Admitting: Cardiovascular Disease

## 2017-07-30 VITALS — BP 102/80 | HR 97 | Ht 68.0 in | Wt 213.0 lb

## 2017-07-30 DIAGNOSIS — I2510 Atherosclerotic heart disease of native coronary artery without angina pectoris: Secondary | ICD-10-CM

## 2017-07-30 DIAGNOSIS — E7800 Pure hypercholesterolemia, unspecified: Secondary | ICD-10-CM | POA: Diagnosis not present

## 2017-07-30 DIAGNOSIS — I10 Essential (primary) hypertension: Secondary | ICD-10-CM

## 2017-07-30 DIAGNOSIS — I2583 Coronary atherosclerosis due to lipid rich plaque: Secondary | ICD-10-CM

## 2017-07-30 NOTE — Assessment & Plan Note (Signed)
History of hyperlipidemia on statin therapy followed by his PCP 

## 2017-07-30 NOTE — Patient Instructions (Signed)
Your physician wants you to follow-up in: 1 year with Dr Berry. You will receive a reminder letter in the mail two months in advance. If you don't receive a letter, please call our office to schedule the follow-up appointment.  

## 2017-07-30 NOTE — Assessment & Plan Note (Signed)
History of essential hypertension blood pressure measured today at 102/80. He is on amlodipine, lisinopril, hydrochlorothiazide and metoprolol. Continue current meds at current dosing

## 2017-07-30 NOTE — Assessment & Plan Note (Signed)
History of DVT in the past on Xarelto which was discontinued because of bleeding duodenal ulcer. Subsequent venous Doppler showed resolution of the DVT.

## 2017-07-30 NOTE — Assessment & Plan Note (Signed)
History of CAD status post cardiac catheterization by myself 11/15/98 revealing three-vessel disease with preserved LV function. Medical therapy was recommended at that time. His last Myoview performed 08/06/10 was nonischemic. He denies chest pain or shortness of breath.

## 2017-07-30 NOTE — Progress Notes (Signed)
07/30/2017 Manuel Grant   [birth year]  [id number]  Primary Physician Dennard Schaumann Cammie Mcgee, MD Primary Cardiologist: [name] MD FACP, [place], [place], [place]  HPI:  Manuel Grant is a 71 y.o. male   mildly to moderately overweight married Caucasian male father of 41, grandfather to 3 grandchildren who I saw 05/27/16. He has a history of ischemic heart disease status post cardiac catheterization by myself November 15, 1998 revealing 3-vessel disease with preserved LV function. Medical therapy was recommended at that time. He had a 30% distal left main, a 75% mid LAD, a 50% OM and a total RCA with left-to-right collaterals and normal LV function. His other problems include hypertension and hyperlipidemia. He is totally asymptomatic. His last Myoview performed August 06, 2010 was nonischemic. Lipid profile performed 03/08/14 revealed a total cholesterol of 98, LDL 31 and HDL 41. The last saw him in October he developed progressive dyspnea on exertion and substernal chest pain. I plan to performing outpatient cardiac catheterization on him however his preprocedure labs revealed profound anemia requiring workup including endoscopy. He was placed on oral therapy for H. Pylori and transfused. His cardiac symptoms have resolved. Back in March he was diagnosed with a left lower extremity deep venous thrombosis involving his common femoral, popliteal and peroneal veins. He was placed on Xarelto oral anticoagulation at that time which resulted in clinical improvement. Followup his Dopplers in July revealed mild partial resolution and venous Dopplers once again performed 02/09/15 showed chronic DVT without acute changes. Since I saw him a year ago he's remained clinically stable denying chest pain or shortness of breath.   Current Meds  Medication Sig  . amLODipine (NORVASC) 10 MG tablet Take 1 tablet (10 mg total) by mouth daily.  . cyanocobalamin (,VITAMIN B-12,) 1000 MCG/ML injection Inject 1 mL (1,000  mcg total) into the muscle every 30 (thirty) days.  [name] glipiZIDE (GLUCOTROL XL) 10 MG 24 hr tablet TAKE 1 TABLET BY MOUTH ONCE DAILY  . Insulin Pen Needle 31G X 5 MM MISC Daily use with Victoza  . isosorbide mononitrate (IMDUR) 60 MG 24 hr tablet Take 1 tablet (60 mg total) by mouth daily. (Patient taking differently: Take 60 mg by mouth every evening. )  . lisinopril-hydrochlorothiazide (PRINZIDE,ZESTORETIC) 20-12.5 MG tablet Take 1 tablet by mouth daily. (Patient taking differently: Take 1 tablet by mouth every evening. )  . lovastatin (MEVACOR) 10 MG tablet Take 1 tablet (10 mg total) by mouth at bedtime.  . metFORMIN (GLUCOPHAGE) 1000 MG tablet TAKE 1 TABLET BY MOUTH TWICE (2) DAILY WITH FOOD  . metoprolol succinate (TOPROL-XL) 50 MG 24 hr tablet TAKE 1 AND 1/2 TABLETS BY MOUTH DAILY. TAKE WITH OR IMMEDIATELY FOLLOWING A MEAL.  . minocycline (MINOCIN,DYNACIN) 50 MG capsule Take 1 capsule by mouth every morning.   . pantoprazole (PROTONIX) 40 MG tablet TAKE 1 TABLET BY MOUTH ONCE A DAY  . tiZANidine (ZANAFLEX) 4 MG tablet TAKE 1 TABLET BY MOUTH ONCE A DAY AT BEDTIME AS NEEDED  . traMADol (ULTRAM) 50 MG tablet TAKE 2 TABLETS BY MOUTH 3 TIMES DAILY ASNEEDED FOR PAIN  . VICTOZA 18 MG/3ML SOPN INJECT 1.2MG UNDER THE SKIN ONCE A DAY  . zolpidem (AMBIEN) 10 MG tablet TAKE 1 TABLET BY MOUTH EVERY NIGHT AT BEDTIME AS NEEDED FOR INSOMNIA.   Current Facility-Administered Medications for the 07/30/17 encounter (Office Visit) with [name], MD  Medication  . 0.9 %  sodium chloride infusion  . dextrose 5 %  solution     Not on File  Social History   Social History  . Marital status: Married    Spouse name: N/A  . Number of children: 2  . Years of education: N/A   Occupational History  . retired    Social History Main Topics  . Smoking status: Former Smoker    Packs/day: 2.00    Years: 9.00    Types: Cigarettes    Quit date: 12/28/1974  . Smokeless tobacco: Never Used  . Alcohol use  0.0 oz/week     Comment: occ  . Drug use: No  . Sexual activity: Not on file   Other Topics Concern  . Not on file   Social History Narrative   Married 1967   Retired from [employer] work   Lives in [place] with his wife.     Review of Systems: General: negative for chills, fever, night sweats or weight changes.  Cardiovascular: negative for chest pain, dyspnea on exertion, edema, orthopnea, palpitations, paroxysmal nocturnal dyspnea or shortness of breath Dermatological: negative for rash Respiratory: negative for cough or wheezing Urologic: negative for hematuria Abdominal: negative for nausea, vomiting, diarrhea, bright red blood per rectum, melena, or hematemesis Neurologic: negative for visual changes, syncope, or dizziness All other systems reviewed and are otherwise negative except as noted above.    Blood pressure 102/80, pulse 97, height 5' 8"  (1.727 m), weight 213 lb (96.6 kg).  General appearance: alert and no distress Neck: no adenopathy, no carotid bruit, no JVD, supple, symmetrical, trachea midline and thyroid not enlarged, symmetric, no tenderness/mass/nodules Lungs: clear to auscultation bilaterally Heart: regular rate and rhythm, S1, S2 normal, no murmur, click, rub or gallop Extremities: extremities normal, atraumatic, no cyanosis or edema  EKG sinus rhythm at 97 without ST or T-wave changes. I personally reviewed this EKG  ASSESSMENT AND PLAN:   Essential hypertension, benign History of essential hypertension blood pressure measured today at 102/80. He is on amlodipine, lisinopril, hydrochlorothiazide and metoprolol. Continue current meds at current dosing  Pure hypercholesterolemia History of hyperlipidemia on statin therapy followed by his PCP  CAD (coronary artery disease) History of CAD status post cardiac catheterization by myself 11/15/98 revealing three-vessel disease with preserved LV function. Medical therapy was recommended at that time. His  last Myoview performed 08/06/10 was nonischemic. He denies chest pain or shortness of breath.  DVT (deep venous thrombosis) History of DVT in the past on Xarelto which was discontinued because of bleeding duodenal ulcer. Subsequent venous Doppler showed resolution of the DVT.      [name] MD [place], [facility] 07/30/2017 3:07 PM

## 2017-08-10 ENCOUNTER — Other Ambulatory Visit: Payer: Self-pay | Admitting: Family Medicine

## 2017-08-10 DIAGNOSIS — E7800 Pure hypercholesterolemia, unspecified: Secondary | ICD-10-CM

## 2017-08-10 DIAGNOSIS — E1149 Type 2 diabetes mellitus with other diabetic neurological complication: Secondary | ICD-10-CM

## 2017-08-10 DIAGNOSIS — I10 Essential (primary) hypertension: Secondary | ICD-10-CM

## 2017-08-10 DIAGNOSIS — D509 Iron deficiency anemia, unspecified: Secondary | ICD-10-CM

## 2017-08-10 DIAGNOSIS — Z79899 Other long term (current) drug therapy: Secondary | ICD-10-CM

## 2017-08-10 DIAGNOSIS — I25119 Atherosclerotic heart disease of native coronary artery with unspecified angina pectoris: Secondary | ICD-10-CM

## 2017-08-10 DIAGNOSIS — N179 Acute kidney failure, unspecified: Secondary | ICD-10-CM

## 2017-08-12 ENCOUNTER — Ambulatory Visit: Payer: Self-pay | Admitting: Family Medicine

## 2017-08-12 ENCOUNTER — Other Ambulatory Visit: Payer: PPO

## 2017-08-12 DIAGNOSIS — Z79899 Other long term (current) drug therapy: Secondary | ICD-10-CM | POA: Diagnosis not present

## 2017-08-12 DIAGNOSIS — N179 Acute kidney failure, unspecified: Secondary | ICD-10-CM

## 2017-08-12 DIAGNOSIS — I10 Essential (primary) hypertension: Secondary | ICD-10-CM | POA: Diagnosis not present

## 2017-08-12 DIAGNOSIS — E7800 Pure hypercholesterolemia, unspecified: Secondary | ICD-10-CM

## 2017-08-12 DIAGNOSIS — D509 Iron deficiency anemia, unspecified: Secondary | ICD-10-CM

## 2017-08-12 DIAGNOSIS — E1149 Type 2 diabetes mellitus with other diabetic neurological complication: Secondary | ICD-10-CM | POA: Diagnosis not present

## 2017-08-12 DIAGNOSIS — I25119 Atherosclerotic heart disease of native coronary artery with unspecified angina pectoris: Secondary | ICD-10-CM | POA: Diagnosis not present

## 2017-08-12 LAB — CBC WITH DIFFERENTIAL/PLATELET
BASOS PCT: 0 %
Basophils Absolute: 0 cells/uL (ref 0–200)
EOS ABS: 118 {cells}/uL (ref 15–500)
EOS PCT: 2 %
HCT: 43.2 % (ref 38.5–50.0)
Hemoglobin: 13.8 g/dL (ref 13.0–17.0)
LYMPHS PCT: 28 %
Lymphs Abs: 1652 cells/uL (ref 850–3900)
MCH: 26.3 pg — ABNORMAL LOW (ref 27.0–33.0)
MCHC: 31.9 g/dL — ABNORMAL LOW (ref 32.0–36.0)
MCV: 82.3 fL (ref 80.0–100.0)
MONOS PCT: 10 %
Monocytes Absolute: 590 cells/uL (ref 200–950)
Neutro Abs: 3540 cells/uL (ref 1500–7800)
Neutrophils Relative %: 60 %
Platelets: 132 10*3/uL — ABNORMAL LOW (ref 140–400)
RBC: 5.25 MIL/uL (ref 4.20–5.80)
RDW: 17.6 % — AB (ref 11.0–15.0)
WBC: 5.9 10*3/uL (ref 3.8–10.8)

## 2017-08-13 LAB — COMPLETE METABOLIC PANEL WITH GFR
ALBUMIN: 4 g/dL (ref 3.6–5.1)
ALK PHOS: 58 U/L (ref 40–115)
ALT: 19 U/L (ref 9–46)
AST: 23 U/L (ref 10–35)
BILIRUBIN TOTAL: 0.4 mg/dL (ref 0.2–1.2)
BUN: 16 mg/dL (ref 7–25)
CALCIUM: 9.4 mg/dL (ref 8.6–10.3)
CO2: 23 mmol/L (ref 20–32)
CREATININE: 1.28 mg/dL — AB (ref 0.70–1.18)
Chloride: 101 mmol/L (ref 98–110)
GFR, Est African American: 64 mL/min (ref >=60)
GFR, Est Non African American: 56 mL/min — ABNORMAL LOW (ref >=60)
GLUCOSE: 128 mg/dL — AB (ref 70–99)
POTASSIUM: 4.5 mmol/L (ref 3.5–5.3)
SODIUM: 141 mmol/L (ref 135–146)
TOTAL PROTEIN: 7 g/dL (ref 6.1–8.1)

## 2017-08-13 LAB — LIPID PANEL
CHOLESTEROL: 157 mg/dL (ref <200)
HDL: 35 mg/dL — AB (ref >40)
LDL Cholesterol: 63 mg/dL (ref <100)
Total CHOL/HDL Ratio: 4.5 Ratio (ref <5.0)
Triglycerides: 297 mg/dL — ABNORMAL HIGH (ref <150)
VLDL: 59 mg/dL — ABNORMAL HIGH (ref <30)

## 2017-08-13 LAB — MICROALBUMIN / CREATININE URINE RATIO
CREATININE, URINE: 150 mg/dL (ref 20–370)
Microalb Creat Ratio: 53 mcg/mg creat — ABNORMAL HIGH (ref <30)
Microalb, Ur: 7.9 mg/dL

## 2017-08-13 LAB — HEMOGLOBIN A1C
Hgb A1c MFr Bld: 7 % — ABNORMAL HIGH (ref <5.7)
MEAN PLASMA GLUCOSE: 154 mg/dL

## 2017-08-16 ENCOUNTER — Other Ambulatory Visit: Payer: Self-pay | Admitting: Internal Medicine

## 2017-08-16 ENCOUNTER — Encounter: Payer: Self-pay | Admitting: Family Medicine

## 2017-08-16 ENCOUNTER — Other Ambulatory Visit: Payer: Self-pay | Admitting: Family Medicine

## 2017-08-16 ENCOUNTER — Ambulatory Visit (INDEPENDENT_AMBULATORY_CARE_PROVIDER_SITE_OTHER): Payer: PPO | Admitting: Family Medicine

## 2017-08-16 VITALS — BP 172/110 | HR 80 | Temp 98.4°F | Resp 14 | Ht 66.75 in | Wt 215.0 lb

## 2017-08-16 DIAGNOSIS — I10 Essential (primary) hypertension: Secondary | ICD-10-CM | POA: Diagnosis not present

## 2017-08-16 DIAGNOSIS — I2510 Atherosclerotic heart disease of native coronary artery without angina pectoris: Secondary | ICD-10-CM | POA: Diagnosis not present

## 2017-08-16 DIAGNOSIS — D229 Melanocytic nevi, unspecified: Secondary | ICD-10-CM | POA: Diagnosis not present

## 2017-08-16 DIAGNOSIS — L918 Other hypertrophic disorders of the skin: Secondary | ICD-10-CM | POA: Diagnosis not present

## 2017-08-16 DIAGNOSIS — L821 Other seborrheic keratosis: Secondary | ICD-10-CM | POA: Diagnosis not present

## 2017-08-16 DIAGNOSIS — L812 Freckles: Secondary | ICD-10-CM | POA: Diagnosis not present

## 2017-08-16 DIAGNOSIS — L570 Actinic keratosis: Secondary | ICD-10-CM | POA: Diagnosis not present

## 2017-08-16 DIAGNOSIS — E119 Type 2 diabetes mellitus without complications: Secondary | ICD-10-CM

## 2017-08-16 DIAGNOSIS — D485 Neoplasm of uncertain behavior of skin: Secondary | ICD-10-CM | POA: Diagnosis not present

## 2017-08-16 DIAGNOSIS — L578 Other skin changes due to chronic exposure to nonionizing radiation: Secondary | ICD-10-CM | POA: Diagnosis not present

## 2017-08-16 NOTE — Progress Notes (Signed)
Subjective:    Patient ID: Manuel Grant, male    DOB: [birth year], 71 y.o.   MRN: [id number]  Hypertension   Medication Refill     10/29/16 The patient's blood pressure is very low today however this is a coincidental finding. He is completely asymptomatic. He denies any orthostatic dizziness, shortness of breath, lightheadedness, chest pain. He denies any blood in his stool or melena. He does have a significant past history of GI bleed. He is not taking any NSAIDs or blood thinner. He is however on numerous medications that could lower his blood pressure. He is not regularly checking his blood pressure at home and he is also not regularly checking his blood sugars. He states that his blood sugars randomly tend to range between 160 and 180. He denies any hypoglycemic episodes. He denies any myalgias or right upper quadrant pain. He denies any polyuria polydipsia or blurry vision or neuropathy. Flu shot is up-to-date.  At that time, my plan was: The patient's blood pressures too low. I will have him temporarily discontinue amlodipine, check his blood pressure everyday and report the values to me in one week. He denies any blood loss or dehydration. He is completely asymptomatic. I will check a hemoglobin A1c. Goal hemoglobin A1c for this patient is less than 7.5 given his age and medical comorbidities. If above his goal, I would like to start the patient on Victoza. I'll also check a fasting lipid panel. Goal LDL cholesterol is less than 70. His flu shot is up-to-date.  01/05/17 The patient's hemoglobin A1c was 8.5. I recommended starting him on Victoza as well as Tradjenta in addition to his metformin, and glipizide. The patient did not take Tradjenta due to cost. Insurance will only cover the medication for one month. He is compliant with the Victoza. He is not checking his blood sugars.  His blood pressure today is very high. He is not taking his amlodipine.  At that time, my plan was: Check  hemoglobin A1c. Goal hemoglobin A1c is less than 7. If not at goal, insurance will not cover Tradjenta, therefore I will start the patient on Invokana in addition to the toes and. I am having uses medication at the direction of his insurance. I would prefer Jardiance but they will not cover that. We did discuss the increased risk of amputation. I also will have the patient to resume his amlodipine and recheck blood pressure in one month.  02/09/17 Patient stop Tradjenta. His last A1c was 7.5. Together we've determined that he would take metformin, Victoza, and glipizide and really work hard on his diet and recheck in 3 months. He is here today to recheck his blood pressure Since resuming his amlodipine, his blood pressures much better. He denies any chest pain shortness of breath or dyspnea on exertion. Today his blood pressures at goal. We also discussed his hemoglobin A1c of 7.5. I explained to the patient that I need him to reduce his consumption of carbohydrates. He admits that he overindulges and potatoes as well as ice cream almost on a daily basis. I have asked him to reduce his consumption of this by at least 50%.  At that time, my plan was: Blood pressure is now controlled. I'll like the patient to reduce his consumption of carbohydrates by 50% increase his aerobic exercise and then recheck his hemoglobin A1c in 3 months  08/16/17 I received communication from the patient's insurance stating that he was not filling his prescription for his cholesterol  medication regularly. I asked the patient to come in to discuss this further. He admits that he misses the cholesterol medicine quite frequently at night. He probably forgets to take it 50% of the time. He also frequently forgets to take his evening dose of metformin. His blood pressure today is extremely high. I verified this myself. He recently saw a heart doctor and his blood pressure was much better however both my nurses value today as well as my  value (and I checked it in each arm) were extremely high.  Patient is very resistant to change. He believes this is due to anxiety in caring for his wife who has dementia as well as trying to manage the sale of her home. I tried to explain to the patient that even if is due to stress and anxiety, the elevated blood pressure puts him at increased risk for heart disease and complications in the future. His most recent lab work is listed below: Appointment on 08/12/2017  Component Date Value Ref Range Status  . Hgb A1c MFr Bld 08/12/2017 7.0* <5.7 % Final   Comment:   For someone without known diabetes, a hemoglobin A1c value of 6.5% or greater indicates that they may have diabetes and this should be confirmed with a follow-up test.   For someone with known diabetes, a value <7% indicates that their diabetes is well controlled and a value greater than or equal to 7% indicates suboptimal control. A1c targets should be individualized based on duration of diabetes, age, comorbid conditions, and other considerations.   Currently, no consensus exists for use of hemoglobin A1c for diagnosis of diabetes for children.     . Mean Plasma Glucose 08/12/2017 154  mg/dL Final  . WBC 08/12/2017 5.9  3.8 - 10.8 K/uL Final  . RBC 08/12/2017 5.25  4.20 - 5.80 MIL/uL Final  . Hemoglobin 08/12/2017 13.8  13.0 - 17.0 g/dL Final  . HCT 08/12/2017 43.2  38.5 - 50.0 % Final  . MCV 08/12/2017 82.3  80.0 - 100.0 fL Final  . MCH 08/12/2017 26.3* 27.0 - 33.0 pg Final  . MCHC 08/12/2017 31.9* 32.0 - 36.0 g/dL Final  . RDW 08/12/2017 17.6* 11.0 - 15.0 % Final  . Platelets 08/12/2017 132* 140 - 400 K/uL Final  . MPV 08/12/2017 CANCELED  7.5 - 12.5 fL Final-Edited   Comment:   Result not calculated because one or more required values exceed analytical limits.    Result canceled by the ancillary   . Neutro Abs 08/12/2017 3540  1,500 - 7,800 cells/uL Final  . Lymphs Abs 08/12/2017 1652  850 - 3,900 cells/uL Final    . Monocytes Absolute 08/12/2017 590  200 - 950 cells/uL Final  . Eosinophils Absolute 08/12/2017 118  15 - 500 cells/uL Final  . Basophils Absolute 08/12/2017 0  0 - 200 cells/uL Final  . Neutrophils Relative % 08/12/2017 60  % Final  . Lymphocytes Relative 08/12/2017 28  % Final  . Monocytes Relative 08/12/2017 10  % Final  . Eosinophils Relative 08/12/2017 2  % Final  . Basophils Relative 08/12/2017 0  % Final  . Smear Review 08/12/2017 Criteria for review not met   Final  . Cholesterol 08/12/2017 157  <200 mg/dL Final  . Triglycerides 08/12/2017 297* <150 mg/dL Final  . HDL 08/12/2017 35* >40 mg/dL Final  . Total CHOL/HDL Ratio 08/12/2017 4.5  <5.0 Ratio Final  . VLDL 08/12/2017 59* <30 mg/dL Final  . LDL Cholesterol 08/12/2017 63  <100  mg/dL Final  . Sodium 08/12/2017 141  135 - 146 mmol/L Final  . Potassium 08/12/2017 4.5  3.5 - 5.3 mmol/L Final  . Chloride 08/12/2017 101  98 - 110 mmol/L Final  . CO2 08/12/2017 23  20 - 32 mmol/L Final   Comment: ** Please note change in reference range(s). **     . Glucose, Bld 08/12/2017 128* 70 - 99 mg/dL Final  . BUN 08/12/2017 16  7 - 25 mg/dL Final  . Creat 08/12/2017 1.28* 0.70 - 1.18 mg/dL Final   Comment:   For patients > or = 72 years of age: The upper reference limit for Creatinine is approximately 13% higher for people identified as African-American.     . Total Bilirubin 08/12/2017 0.4  0.2 - 1.2 mg/dL Final  . Alkaline Phosphatase 08/12/2017 58  40 - 115 U/L Final  . AST 08/12/2017 23  10 - 35 U/L Final  . ALT 08/12/2017 19  9 - 46 U/L Final  . Total Protein 08/12/2017 7.0  6.1 - 8.1 g/dL Final  . Albumin 08/12/2017 4.0  3.6 - 5.1 g/dL Final  . Calcium 08/12/2017 9.4  8.6 - 10.3 mg/dL Final  . GFR, Est African American 08/12/2017 64  >=60 mL/min Final  . GFR, Est Non African American 08/12/2017 56* >=60 mL/min Final  . Creatinine, Urine 08/12/2017 150  20 - 370 mg/dL Final  . Microalb, Ur 08/12/2017 7.9  Not estab mg/dL  Final  . Microalb Creat Ratio 08/12/2017 53* <30 mcg/mg creat Final   Comment: The ADA has defined abnormalities in albumin excretion as follows:           Category           Result                            (mcg/mg creatinine)                 Normal:    <30       Microalbuminuria:    30 - 299   Clinical albuminuria:    > or = 300   The ADA recommends that at least two of three specimens collected within a 3 - 6 month period be abnormal before considering a patient to be within a diagnostic category.        Past Medical History:  Diagnosis Date  . B12 deficiency   . Chronic deep vein thrombosis (DVT) ([facility]) dx 2015  start on xarelto  LLE--  stopped in 03/ 2016 due to GI bleed   last doppler 02-09-2015 chronic left common femoral and popliteal dvt's (mild partial resolution since dx 2015)  . CKD (chronic kidney disease), stage II   . Coronary artery disease cardiologist-  dr berry   a. 10/1998 Cath: LM 30d, LAD 53m OM 50, RCA 100 w/ L->R collats, nl LV ->Med Rx;  b. 07/2010 MV: no ischemia.  . DDD (degenerative disc disease), cervical   . GERD (gastroesophageal reflux disease)   . Hiatal hernia   . History of adenomatous polyp of colon   . History of balanitis    05/ 2017  . History of duodenal ulcer    w/ GI bleed in 2015 and 2016  . History of GI bleed    secondary to upper duodenal ulcer's 2015 and 2016   . History of Helicobacter pylori infection    2015  . History of kidney stones   .  Hyperlipidemia   . Hypertension   . Ileo-anal pouch ulcer (HCC)    chronic  . Insomnia   . Lower urinary tract symptoms (LUTS)   . Osteoarthritis    knees  . Phimosis   . Rosacea   . Type 2 diabetes mellitus ([facility])    last A1c 8.5 on 03-30-2016  . Ulcerative colitis followed by  dr Ulice Dash pyrtle   s/p colectomy ~ 2005 at  [facility]  . Wears dentures    upper  . Wears glasses    Past Surgical History:  Procedure Laterality Date  . CARDIAC CATHETERIZATION  11/15/1998   dr berry   30%  distal left main, 75% mid LAD, 50% OM and total RCA with left to right collateral and normal LV function.  [name] CIRCUMCISION N/A 09/25/2016   Procedure: CIRCUMCISION ADULT;  Surgeon: Alexis Frock, MD;  Location: [facility];  Service: Urology;  Laterality: N/A;  . COMPLETION PROCTECTOMY/ CONSTRUCTION ILEAL J POUCH/ DIVERTING LOOP ILEOSTOMY/ EXTENSIVE LYSIS ADHESIONS  11-07-2004   [facility]  . ESOPHAGOGASTRODUODENOSCOPY N/A 03/16/2014   Procedure: ESOPHAGOGASTRODUODENOSCOPY (EGD);  Surgeon: Jerene Bears, MD;  Location: [facility] ENDOSCOPY;  Service: Endoscopy;  Laterality: N/A;  . FLEXIBLE SIGMOIDOSCOPY N/A 03/16/2014   Procedure: FLEXIBLE SIGMOIDOSCOPY;  Surgeon: Jerene Bears, MD;  Location: [facility] ENDOSCOPY;  Service: Endoscopy;  Laterality: N/A;  . KNEE ARTHROSCOPY Bilateral 1990's  . NM MYOCAR PERF WALL MOTION  08/06/2010   dr berry   Low risk nuclear study w/ no ischemia (no significant change compared to prior study)/  normal LV function and wall motion , ef 60%  . SUBTOTAL COLECTOMY  W/ END ILEOSTOMY  05/ 2005   [facility]  . TAKE DOWN LOOP ILEOSTOMY  01-28-2005    [facility]  . TRANSTHORACIC ECHOCARDIOGRAM  11/16/2007   mild concentric LVH,  ef >55%,  grade 1 diastolic dysfuction/  trivial MR and TR/  borderline RVE/  focal nodular thickening of noncoronary cusp of trileaflet AV/     Current Outpatient Prescriptions on File Prior to Visit  Medication Sig Dispense Refill  . amLODipine (NORVASC) 10 MG tablet Take 1 tablet (10 mg total) by mouth daily. 90 tablet 3  . cyanocobalamin (,VITAMIN B-12,) 1000 MCG/ML injection Inject 1 mL (1,000 mcg total) into the muscle every 30 (thirty) days. 10 mL 2  . glipiZIDE (GLUCOTROL XL) 10 MG 24 hr tablet TAKE 1 TABLET BY MOUTH ONCE DAILY 30 tablet 1  . Insulin Pen Needle 31G X 5 MM MISC Daily use with Victoza 100 each 5  . isosorbide mononitrate (IMDUR) 60 MG 24 hr tablet Take 1 tablet (60 mg total) by mouth daily. (Patient taking differently: Take 60 mg by mouth every  evening. ) 90 tablet 3  . lisinopril-hydrochlorothiazide (PRINZIDE,ZESTORETIC) 20-12.5 MG tablet Take 1 tablet by mouth daily. (Patient taking differently: Take 1 tablet by mouth every evening. ) 90 tablet 2  . lovastatin (MEVACOR) 10 MG tablet Take 1 tablet (10 mg total) by mouth at bedtime. 30 tablet 7  . metFORMIN (GLUCOPHAGE) 1000 MG tablet TAKE 1 TABLET BY MOUTH TWICE (2) DAILY WITH FOOD 60 tablet 1  . metoprolol succinate (TOPROL-XL) 50 MG 24 hr tablet TAKE 1 AND 1/2 TABLETS BY MOUTH DAILY. TAKE WITH OR IMMEDIATELY FOLLOWING A MEAL. 45 tablet 2  . minocycline (MINOCIN,DYNACIN) 50 MG capsule Take 1 capsule by mouth every morning.     . pantoprazole (PROTONIX) 40 MG tablet TAKE 1 TABLET BY MOUTH ONCE A DAY 30 tablet 4  .  tiZANidine (ZANAFLEX) 4 MG tablet TAKE 1 TABLET BY MOUTH ONCE A DAY AT BEDTIME AS NEEDED 30 tablet 2  . traMADol (ULTRAM) 50 MG tablet TAKE 2 TABLETS BY MOUTH 3 TIMES DAILY ASNEEDED FOR PAIN 180 tablet 0  . VICTOZA 18 MG/3ML SOPN INJECT 1.2MG UNDER THE SKIN ONCE A DAY 6 mL 3  . zolpidem (AMBIEN) 10 MG tablet TAKE 1 TABLET BY MOUTH EVERY NIGHT AT BEDTIME AS NEEDED FOR INSOMNIA. 30 tablet 2   Current Facility-Administered Medications on File Prior to Visit  Medication Dose Route Frequency Provider Last Rate Last Dose  . 0.9 %  sodium chloride infusion  500 mL Intravenous Continuous Pyrtle, Lajuan Lines, MD      . dextrose 5 % solution   Intravenous Continuous Pyrtle, Lajuan Lines, MD       Not on File Social History   Social History  . Marital status: Married    Spouse name: N/A  . Number of children: 2  . Years of education: N/A   Occupational History  . retired    Social History Main Topics  . Smoking status: Former Smoker    Packs/day: 2.00    Years: 9.00    Types: Cigarettes    Quit date: 12/28/1974  . Smokeless tobacco: Never Used  . Alcohol use 0.0 oz/week     Comment: occ  . Drug use: No  . Sexual activity: Not on file   Other Topics Concern  . Not on file    Social History Narrative   Married 1967   Retired from [employer] work   Lives in [place] with his wife.      Review of Systems  All other systems reviewed and are negative.      Objective:   Physical Exam  Constitutional: He appears well-developed and well-nourished.  HENT:  Mouth/Throat: Oropharynx is clear and moist.  Neck: Neck supple. No JVD present. No thyromegaly present.  Cardiovascular: Normal rate, regular rhythm and normal heart sounds.   No murmur heard. Pulmonary/Chest: Effort normal and breath sounds normal. No respiratory distress. He has no wheezes. He has no rales.  Abdominal: Soft. Bowel sounds are normal. He exhibits no distension. There is no tenderness. There is no rebound and no guarding.  Musculoskeletal: He exhibits no edema.  Vitals reviewed.         Assessment & Plan:  Benign essential HTN  Diabetes mellitus type II, non insulin dependent (HCC)  ASCVD (arteriosclerotic cardiovascular disease)  Given his advanced age, I'm willing to except his hemoglobin A1c. His LDL cholesterol is at goal. I encouraged the patient to take lovastatin every day along with metformin twice daily. However I'm very concerned about his blood pressure. I asked the patient to check his blood pressure several times over the next 48 hours and report the values to me. If consistently greater than 140/90, I would increase his medication to manage his blood pressure. Patient is very resistant to this but he states that he will check his blood pressure and let me know the readings. He believes his blood pressure is elevated today as a "fluke."

## 2017-08-16 NOTE — Telephone Encounter (Signed)
ok 

## 2017-08-16 NOTE — Telephone Encounter (Signed)
Ok to refill 

## 2017-09-14 ENCOUNTER — Other Ambulatory Visit: Payer: Self-pay | Admitting: Family Medicine

## 2017-09-14 ENCOUNTER — Other Ambulatory Visit: Payer: Self-pay | Admitting: Cardiovascular Disease

## 2017-09-27 ENCOUNTER — Other Ambulatory Visit: Payer: Self-pay | Admitting: Family Medicine

## 2017-09-27 DIAGNOSIS — I2510 Atherosclerotic heart disease of native coronary artery without angina pectoris: Secondary | ICD-10-CM

## 2017-09-27 NOTE — Telephone Encounter (Signed)
Refill appropriate 

## 2017-10-14 ENCOUNTER — Other Ambulatory Visit: Payer: Self-pay | Admitting: Family Medicine

## 2017-10-14 NOTE — Telephone Encounter (Signed)
Ok to refill 

## 2017-10-14 NOTE — Telephone Encounter (Signed)
ok 

## 2017-11-08 ENCOUNTER — Other Ambulatory Visit: Payer: Self-pay | Admitting: Family Medicine

## 2017-11-08 NOTE — Telephone Encounter (Signed)
ok 

## 2017-11-08 NOTE — Telephone Encounter (Signed)
Ok to refill 

## 2017-11-09 NOTE — Telephone Encounter (Signed)
Medication called to pharmacy. 

## 2017-11-16 ENCOUNTER — Other Ambulatory Visit: Payer: Self-pay | Admitting: Family Medicine

## 2017-11-16 NOTE — Telephone Encounter (Signed)
ok 

## 2017-11-16 NOTE — Telephone Encounter (Signed)
Medication called to pharmacy. 

## 2017-11-16 NOTE — Telephone Encounter (Signed)
Ok to refill 

## 2017-11-24 ENCOUNTER — Other Ambulatory Visit: Payer: Self-pay | Admitting: Cardiovascular Disease

## 2017-11-30 ENCOUNTER — Other Ambulatory Visit: Payer: Self-pay | Admitting: Family Medicine

## 2017-12-10 ENCOUNTER — Other Ambulatory Visit: Payer: Self-pay | Admitting: Family Medicine

## 2017-12-10 NOTE — Telephone Encounter (Signed)
Ok to refill 

## 2017-12-13 ENCOUNTER — Telehealth: Payer: Self-pay | Admitting: Family Medicine

## 2017-12-13 NOTE — Telephone Encounter (Signed)
Med called to pharm 

## 2017-12-13 NOTE — Telephone Encounter (Signed)
ok 

## 2017-12-13 NOTE — Telephone Encounter (Signed)
Med refill on tramadol sent to [facility].

## 2017-12-16 ENCOUNTER — Other Ambulatory Visit: Payer: Self-pay | Admitting: Family Medicine

## 2017-12-16 ENCOUNTER — Other Ambulatory Visit: Payer: Self-pay | Admitting: Internal Medicine

## 2017-12-16 DIAGNOSIS — I2510 Atherosclerotic heart disease of native coronary artery without angina pectoris: Secondary | ICD-10-CM

## 2018-01-13 ENCOUNTER — Other Ambulatory Visit: Payer: Self-pay | Admitting: Family Medicine

## 2018-01-13 NOTE — Telephone Encounter (Signed)
Ok to refill??  Last office visit 08/16/2017.  Last refill 12/13/2017.

## 2018-01-24 ENCOUNTER — Other Ambulatory Visit: Payer: Self-pay | Admitting: Family Medicine

## 2018-02-09 ENCOUNTER — Other Ambulatory Visit: Payer: Self-pay | Admitting: Family Medicine

## 2018-02-10 ENCOUNTER — Other Ambulatory Visit: Payer: Self-pay | Admitting: Family Medicine

## 2018-02-10 NOTE — Telephone Encounter (Signed)
Requesting refill Tramadol      LOV:  08/16/17 LRF:  01/13/18

## 2018-02-15 ENCOUNTER — Other Ambulatory Visit: Payer: Self-pay | Admitting: Family Medicine

## 2018-02-16 NOTE — Telephone Encounter (Signed)
Ok to refill 

## 2018-02-17 ENCOUNTER — Other Ambulatory Visit: Payer: Self-pay | Admitting: Internal Medicine

## 2018-03-11 ENCOUNTER — Inpatient Hospital Stay (HOSPITAL_COMMUNITY)
Admission: EM | Admit: 2018-03-11 | Discharge: 2018-03-14 | DRG: 299 | Disposition: A | Discharge status: Home / Self Care | Payer: PPO | Attending: Internal Medicine | Admitting: Internal Medicine

## 2018-03-11 ENCOUNTER — Emergency Department (EMERGENCY_DEPARTMENT_HOSPITAL)
Admit: 2018-03-11 | Discharge: 2018-03-11 | Disposition: A | Discharge status: Home / Self Care | Payer: PPO | Attending: Emergency Medicine | Admitting: Emergency Medicine

## 2018-03-11 ENCOUNTER — Other Ambulatory Visit: Payer: Self-pay

## 2018-03-11 ENCOUNTER — Encounter (HOSPITAL_COMMUNITY): Payer: Self-pay | Admitting: *Deleted

## 2018-03-11 DIAGNOSIS — I130 Hypertensive heart and chronic kidney disease with heart failure and stage 1 through stage 4 chronic kidney disease, or unspecified chronic kidney disease: Secondary | ICD-10-CM | POA: Diagnosis not present

## 2018-03-11 DIAGNOSIS — E785 Hyperlipidemia, unspecified: Secondary | ICD-10-CM | POA: Diagnosis present

## 2018-03-11 DIAGNOSIS — Z86010 Personal history of colonic polyps: Secondary | ICD-10-CM

## 2018-03-11 DIAGNOSIS — E1149 Type 2 diabetes mellitus with other diabetic neurological complication: Secondary | ICD-10-CM | POA: Diagnosis not present

## 2018-03-11 DIAGNOSIS — M79661 Pain in right lower leg: Secondary | ICD-10-CM

## 2018-03-11 DIAGNOSIS — I82401 Acute embolism and thrombosis of unspecified deep veins of right lower extremity: Principal | ICD-10-CM | POA: Diagnosis present

## 2018-03-11 DIAGNOSIS — E1122 Type 2 diabetes mellitus with diabetic chronic kidney disease: Secondary | ICD-10-CM | POA: Diagnosis not present

## 2018-03-11 DIAGNOSIS — R600 Localized edema: Secondary | ICD-10-CM

## 2018-03-11 DIAGNOSIS — I5032 Chronic diastolic (congestive) heart failure: Secondary | ICD-10-CM | POA: Diagnosis not present

## 2018-03-11 DIAGNOSIS — I82409 Acute embolism and thrombosis of unspecified deep veins of unspecified lower extremity: Secondary | ICD-10-CM | POA: Diagnosis present

## 2018-03-11 DIAGNOSIS — Z79899 Other long term (current) drug therapy: Secondary | ICD-10-CM

## 2018-03-11 DIAGNOSIS — Z8619 Personal history of other infectious and parasitic diseases: Secondary | ICD-10-CM

## 2018-03-11 DIAGNOSIS — T466X6A Underdosing of antihyperlipidemic and antiarteriosclerotic drugs, initial encounter: Secondary | ICD-10-CM | POA: Diagnosis not present

## 2018-03-11 DIAGNOSIS — M170 Bilateral primary osteoarthritis of knee: Secondary | ICD-10-CM | POA: Diagnosis present

## 2018-03-11 DIAGNOSIS — I82811 Embolism and thrombosis of superficial veins of right lower extremities: Secondary | ICD-10-CM | POA: Diagnosis not present

## 2018-03-11 DIAGNOSIS — I2510 Atherosclerotic heart disease of native coronary artery without angina pectoris: Secondary | ICD-10-CM | POA: Diagnosis present

## 2018-03-11 DIAGNOSIS — E538 Deficiency of other specified B group vitamins: Secondary | ICD-10-CM | POA: Diagnosis present

## 2018-03-11 DIAGNOSIS — Z9049 Acquired absence of other specified parts of digestive tract: Secondary | ICD-10-CM

## 2018-03-11 DIAGNOSIS — E7800 Pure hypercholesterolemia, unspecified: Secondary | ICD-10-CM | POA: Diagnosis present

## 2018-03-11 DIAGNOSIS — G4700 Insomnia, unspecified: Secondary | ICD-10-CM | POA: Diagnosis not present

## 2018-03-11 DIAGNOSIS — Z87891 Personal history of nicotine dependence: Secondary | ICD-10-CM | POA: Diagnosis not present

## 2018-03-11 DIAGNOSIS — Z7982 Long term (current) use of aspirin: Secondary | ICD-10-CM

## 2018-03-11 DIAGNOSIS — I824Z1 Acute embolism and thrombosis of unspecified deep veins of right distal lower extremity: Secondary | ICD-10-CM | POA: Diagnosis not present

## 2018-03-11 DIAGNOSIS — K5190 Ulcerative colitis, unspecified, without complications: Secondary | ICD-10-CM | POA: Diagnosis present

## 2018-03-11 DIAGNOSIS — K219 Gastro-esophageal reflux disease without esophagitis: Secondary | ICD-10-CM | POA: Diagnosis present

## 2018-03-11 DIAGNOSIS — E669 Obesity, unspecified: Secondary | ICD-10-CM | POA: Diagnosis present

## 2018-03-11 DIAGNOSIS — Z6834 Body mass index (BMI) 34.0-34.9, adult: Secondary | ICD-10-CM | POA: Diagnosis not present

## 2018-03-11 DIAGNOSIS — N182 Chronic kidney disease, stage 2 (mild): Secondary | ICD-10-CM | POA: Diagnosis not present

## 2018-03-11 DIAGNOSIS — I10 Essential (primary) hypertension: Secondary | ICD-10-CM | POA: Diagnosis not present

## 2018-03-11 DIAGNOSIS — M5030 Other cervical disc degeneration, unspecified cervical region: Secondary | ICD-10-CM | POA: Diagnosis present

## 2018-03-11 DIAGNOSIS — K264 Chronic or unspecified duodenal ulcer with hemorrhage: Secondary | ICD-10-CM | POA: Diagnosis not present

## 2018-03-11 DIAGNOSIS — K229 Disease of esophagus, unspecified: Secondary | ICD-10-CM | POA: Diagnosis not present

## 2018-03-11 DIAGNOSIS — K51918 Ulcerative colitis, unspecified with other complication: Secondary | ICD-10-CM | POA: Diagnosis not present

## 2018-03-11 DIAGNOSIS — I2583 Coronary atherosclerosis due to lipid rich plaque: Secondary | ICD-10-CM | POA: Diagnosis not present

## 2018-03-11 DIAGNOSIS — R402413 Glasgow coma scale score 13-15, at hospital admission: Secondary | ICD-10-CM | POA: Diagnosis present

## 2018-03-11 DIAGNOSIS — Z7984 Long term (current) use of oral hypoglycemic drugs: Secondary | ICD-10-CM

## 2018-03-11 DIAGNOSIS — I82431 Acute embolism and thrombosis of right popliteal vein: Secondary | ICD-10-CM | POA: Diagnosis not present

## 2018-03-11 LAB — CBC WITH DIFFERENTIAL/PLATELET
BASOS ABS: 0 10*3/uL (ref 0.0–0.1)
BASOS PCT: 1 %
EOS ABS: 0.1 10*3/uL (ref 0.0–0.7)
EOS PCT: 2 %
HEMATOCRIT: 39.6 % (ref 39.0–52.0)
Hemoglobin: 12.6 g/dL — ABNORMAL LOW (ref 13.0–17.0)
Lymphocytes Relative: 31 %
Lymphs Abs: 2.2 10*3/uL (ref 0.7–4.0)
MCH: 25.4 pg — ABNORMAL LOW (ref 26.0–34.0)
MCHC: 31.8 g/dL (ref 30.0–36.0)
MCV: 79.7 fL (ref 78.0–100.0)
MONO ABS: 0.6 10*3/uL (ref 0.1–1.0)
MONOS PCT: 9 %
Neutro Abs: 4.2 10*3/uL (ref 1.7–7.7)
Neutrophils Relative %: 57 %
PLATELETS: 177 10*3/uL (ref 150–400)
RBC: 4.97 MIL/uL (ref 4.22–5.81)
RDW: 17.8 % — AB (ref 11.5–15.5)
WBC: 7.2 10*3/uL (ref 4.0–10.5)

## 2018-03-11 LAB — BASIC METABOLIC PANEL
Anion gap: 10 (ref 5–15)
BUN: 19 mg/dL (ref 6–20)
CALCIUM: 9.2 mg/dL (ref 8.9–10.3)
CO2: 24 mmol/L (ref 22–32)
CREATININE: 1.33 mg/dL — AB (ref 0.61–1.24)
Chloride: 104 mmol/L (ref 101–111)
GFR calc Af Amer: >60 mL/min (ref >60)
GFR, EST NON AFRICAN AMERICAN: 52 mL/min — AB (ref >60)
GLUCOSE: 89 mg/dL (ref 65–99)
Potassium: 4.4 mmol/L (ref 3.5–5.1)
Sodium: 138 mmol/L (ref 135–145)

## 2018-03-11 LAB — GLUCOSE, CAPILLARY: Glucose-Capillary: 174 mg/dL — ABNORMAL HIGH (ref 65–99)

## 2018-03-11 MED ORDER — LISINOPRIL 20 MG PO TABS
20.0000 mg | ORAL_TABLET | Freq: Every day | ORAL | Status: DC
  Administered 2018-03-12 – 2018-03-14 (×3): 20 mg via ORAL
  Filled 2018-03-11 (×3): qty 1

## 2018-03-11 MED ORDER — ONDANSETRON HCL 4 MG/2ML IJ SOLN
4.0000 mg | Freq: Four times a day (QID) | INTRAMUSCULAR | Status: DC | PRN
Start: 2018-03-11 — End: 2018-03-14

## 2018-03-11 MED ORDER — TRAMADOL HCL 50 MG PO TABS
50.0000 mg | ORAL_TABLET | Freq: Four times a day (QID) | ORAL | Status: DC | PRN
  Administered 2018-03-12 – 2018-03-13 (×2): 50 mg via ORAL
  Filled 2018-03-11 (×2): qty 1

## 2018-03-11 MED ORDER — INSULIN ASPART 100 UNIT/ML ~~LOC~~ SOLN
0.0000 [IU] | Freq: Three times a day (TID) | SUBCUTANEOUS | Status: DC
  Administered 2018-03-12: 8 [IU] via SUBCUTANEOUS
  Administered 2018-03-12 – 2018-03-13 (×2): 3 [IU] via SUBCUTANEOUS
  Administered 2018-03-13: 2 [IU] via SUBCUTANEOUS
  Administered 2018-03-13: 5 [IU] via SUBCUTANEOUS
  Administered 2018-03-14: 2 [IU] via SUBCUTANEOUS
  Administered 2018-03-14: 3 [IU] via SUBCUTANEOUS

## 2018-03-11 MED ORDER — INSULIN ASPART 100 UNIT/ML ~~LOC~~ SOLN: 0.0000 [IU] | Freq: Every day | SUBCUTANEOUS | Status: DC

## 2018-03-11 MED ORDER — ISOSORBIDE MONONITRATE ER 60 MG PO TB24
60.0000 mg | ORAL_TABLET | Freq: Every day | ORAL | Status: DC
  Administered 2018-03-12 – 2018-03-14 (×3): 60 mg via ORAL
  Filled 2018-03-11 (×3): qty 1

## 2018-03-11 MED ORDER — HEPARIN BOLUS VIA INFUSION
3000.0000 [IU] | Freq: Once | INTRAVENOUS | Status: AC
Start: 2018-03-11 — End: 2018-03-11
  Administered 2018-03-11: 3000 [IU] via INTRAVENOUS
  Filled 2018-03-11: qty 3000

## 2018-03-11 MED ORDER — ZOLPIDEM TARTRATE 5 MG PO TABS: 10.0000 mg | ORAL_TABLET | Freq: Every evening | ORAL | Status: DC | PRN

## 2018-03-11 MED ORDER — ACETAMINOPHEN 325 MG PO TABS: 650.0000 mg | ORAL_TABLET | Freq: Four times a day (QID) | ORAL | Status: DC | PRN

## 2018-03-11 MED ORDER — ZOLPIDEM TARTRATE 5 MG PO TABS
5.0000 mg | ORAL_TABLET | Freq: Every evening | ORAL | Status: DC | PRN
  Administered 2018-03-12 (×2): 5 mg via ORAL
  Filled 2018-03-11 (×2): qty 1

## 2018-03-11 MED ORDER — HEPARIN (PORCINE) IN NACL 100-0.45 UNIT/ML-% IJ SOLN
1300.0000 [IU]/h | INTRAMUSCULAR | Status: DC
  Administered 2018-03-11 – 2018-03-14 (×2): 1300 [IU]/h via INTRAVENOUS
  Filled 2018-03-11 (×4): qty 250

## 2018-03-11 MED ORDER — METOPROLOL SUCCINATE ER 50 MG PO TB24
75.0000 mg | ORAL_TABLET | Freq: Every day | ORAL | Status: DC
  Administered 2018-03-12 – 2018-03-14 (×3): 75 mg via ORAL
  Filled 2018-03-11 (×3): qty 1

## 2018-03-11 MED ORDER — HYDROCODONE-ACETAMINOPHEN 5-325 MG PO TABS
1.0000 | ORAL_TABLET | ORAL | Status: DC | PRN
  Filled 2018-03-11: qty 2

## 2018-03-11 MED ORDER — ACETAMINOPHEN 650 MG RE SUPP: 650.0000 mg | Freq: Four times a day (QID) | RECTAL | Status: DC | PRN

## 2018-03-11 MED ORDER — PRAVASTATIN SODIUM 20 MG PO TABS
10.0000 mg | ORAL_TABLET | Freq: Every day | ORAL | Status: DC
  Administered 2018-03-13: 10 mg via ORAL
  Filled 2018-03-11 (×2): qty 1

## 2018-03-11 MED ORDER — AMLODIPINE BESYLATE 10 MG PO TABS
10.0000 mg | ORAL_TABLET | Freq: Every day | ORAL | Status: DC
  Administered 2018-03-12 – 2018-03-14 (×3): 10 mg via ORAL
  Filled 2018-03-11 (×3): qty 1

## 2018-03-11 MED ORDER — ONDANSETRON HCL 4 MG PO TABS: 4.0000 mg | ORAL_TABLET | Freq: Four times a day (QID) | ORAL | Status: DC | PRN

## 2018-03-11 NOTE — ED Triage Notes (Signed)
To ED for eval of right leg pain. Noted several areas with 'knots' and redness - since last Friday. Hx of blood clots in left leg a year ago. Placed on xyrelto but taken off after 36month due to gib. Pt is taking 3227masa daily. No cp. No sob

## 2018-03-11 NOTE — ED Notes (Signed)
Attempted report x1. 

## 2018-03-11 NOTE — Progress Notes (Signed)
RLE venous duplex prelim: DVT noted in the right PTV and soleal veins. Superficial thrombosis noted in the right GSV, from calf up to groin, but not into saphenofemoral junction. Landry Mellow, RDMS, RVT Called results to Dr. Johnney Killian.

## 2018-03-11 NOTE — Progress Notes (Signed)
ANTICOAGULATION CONSULT NOTE - Initial Consult  Pharmacy Consult for Heparin Indication: DVT  No Known Allergies  Patient Measurements: Height: 5' 7"  (170.2 cm) Weight: 218 lb 4.1 oz (99 kg) IBW/kg (Calculated) : 66.1 Heparin Dosing Weight: 87  Vital Signs: Temp: 98.2 F (36.8 C) (03/15 0959) Temp Source: Oral (03/15 0959) BP: 125/75 (03/15 1800) Pulse Rate: 80 (03/15 1800)  Labs: Recent Labs    03/11/18 1935  HGB 12.6*  HCT 39.6  PLT 177  CREATININE 1.33*    Estimated Creatinine Clearance: 56.3 mL/min (A) (by C-G formula based on SCr of 1.33 mg/dL (H)).   Medical History: Past Medical History:  Diagnosis Date  . B12 deficiency   . Chronic deep vein thrombosis (DVT) ([place]) dx 2015  start on xarelto  LLE--  stopped in 03/ 2016 due to GI bleed   last doppler 02-09-2015 chronic left common femoral and popliteal dvt's (mild partial resolution since dx 2015)  . CKD (chronic kidney disease), stage II   . Coronary artery disease cardiologist-  dr berry   a. 10/1998 Cath: LM 30d, LAD 52m OM 50, RCA 100 w/ L->R collats, nl LV ->Med Rx;  b. 07/2010 MV: no ischemia.  . DDD (degenerative disc disease), cervical   . GERD (gastroesophageal reflux disease)   . Hiatal hernia   . History of adenomatous polyp of colon   . History of balanitis    05/ 2017  . History of duodenal ulcer    w/ GI bleed in 2015 and 2016  . History of GI bleed    secondary to upper duodenal ulcer's 2015 and 2016   . History of Helicobacter pylori infection    2015  . History of kidney stones   . Hyperlipidemia   . Hypertension   . Ileo-anal pouch ulcer (HCC)    chronic  . Insomnia   . Lower urinary tract symptoms (LUTS)   . Osteoarthritis    knees  . Phimosis   . Rosacea   . Type 2 diabetes mellitus [place]    last A1c 8.5 on 03-30-2016  . Ulcerative colitis followed by  [name]   [facility] . W[facility]    upper  . Wears glasses     Assessment: [id number] and found to have a DVT. The patient has history of prior DVTs treated with Xarelto however this was stopped in March 2016 in the setting of a GIB. Pharmacy has been consulted to start Heparin this admission for anticoagulation. Hep Wt: 87 kg. Hgb 12.6, plts 177  Goal of Therapy:  Heparin level 0.3-0.7 units/ml Monitor platelets by anticoagulation protocol: Yes   Plan:  1. Heparin 3000 unit bolus x 1 2. Start Heparin at 1300 units/hr (13 ml/hr) 3. Will continue to monitor for any signs/symptoms of bleeding and will follow up with heparin level in 8 hours   Thank you for allowing pharmacy to be a part of this patient's care.  EAlycia Rossetti PharmD, BCPS Clinical Pharmacist Pager: [phone] phone for 03/11/2018 : [id number]/[phone] 8:04 PM

## 2018-03-11 NOTE — H&P (Addendum)
Manuel Grant:[id number] DOB: Jun 22, 1946 DOA: 03/11/2018     PCP: [name], MD   Outpatient Specialists: Cardiology berry  , gastroenterology Dr. Hilarie Fredrickson Patient coming from:   home Lives  With family    Chief Complaint:  Chief Complaint  Patient presents with  . Leg Pain    HPI: Manuel Grant is a 72 y.o. male with medical history significant of CAD, HTN, HLD, obesity, CKD stage II, history of doing vaginal bleed in 2000 15/2016, DM type II, ulcerative colitis status post colectomy chronic diastolic CHF     Presented with make history of pain and redness of his right lower extremity he is currently on 325 mg of aspirin daily prior history of DVTs not a candidate for anticoagulation with history of GI bleeding.  Denies any fevers or chills no chest pain or shortness of breath. Denies long trips, no hx of cancer. Reports staying active.reports stick of wood hit his leg 2 months ago and he started to have pain and swelling that persisted.   1 year ago patient was diagnosed with left lower extremity DVT started on Xarelto repeat imaging showed chronic DVT and he was continued on anticoagulation unfortunately he developed bleeding duodenal ulcer. History of ulcerative colitis status post colectomy followed by GI last colonoscopy done in 2018 January showed chronic nonhealing pouch ulceration. This has been causing intermittent bleeding felt to be secondary to ischemia been seen for this by surgery did not want to undergone any surgical intervention was advised to decrease his abdominal girth to try to help with ischemia    While in ER: Dopplers showed DVT in the right PTV and soleal veins. Given history of prior bleeding patient was started on heparin  Significant initial  Findings:  WBC  7.2 Hemoglobin & Hematocrit     Component Value Date/Time   HGB 12.6 (L) 03/11/2018 1935   HGB 11.6 (L) 05/24/2014 0817   HCT 39.6 03/11/2018 1935   HCT 37.3 (L) 05/24/2014  0817  Platelets 177  K4.4  Lab Results  Component Value Date   CREATININE 1.33 (H) 03/11/2018   CREATININE 1.28 (H) 08/12/2017   CREATININE 1.21 (H) 01/04/2017     ER provider discussed case with: Vascular surgery Dr. Trula Slade who recommends: Initiation of heparin and IVC filter placement We'll see patient in consult in the ER    IN ER:  Temp (24hrs), Avg:98.2 F (36.8 C), Min:98.2 F (36.8 C), Max:98.2 F (36.8 C)      on arrival  ED Triage Vitals  Enc Vitals Group     BP 03/11/18 0959 (!) 122/91     Pulse Rate 03/11/18 0959 100     Resp 03/11/18 0959 18     Temp 03/11/18 0959 98.2 F (36.8 C)     Temp Source 03/11/18 0959 Oral     SpO2 03/11/18 0959 96 %     Weight 03/11/18 1900 218 lb 4.1 oz (99 kg)     Height 03/11/18 1900 5' 7"  (1.702 m)     Head Circumference --      Peak Flow --      Pain Score 03/11/18 1628 6     Pain Loc --      Pain Edu? --      Excl. in [place]? --      Latest   Blood pressure 125/75, pulse 80, temperature 98.2 F (36.8 C), temperature source Oral, resp. rate 18, height 5' 7"  (1.702 m), weight  99 kg (218 lb 4.1 oz), SpO2 97 %.   Following Medications were ordered in ER: Medications  heparin ADULT infusion 100 units/mL (25000 units/224m sodium chloride 0.45%) (1,300 Units/hr Intravenous New Bag/Given 03/11/18 2031)  heparin bolus via infusion 3,000 Units (3,000 Units Intravenous Bolus from Bag 03/11/18 2032)     Hospitalist was called for admission for DVT requiring heparin   Regarding pertinent Chronic problems: Followed by cardiology regarding CAD last Myoview in 2011 nonischemic In 2018 found to be anemic endoscopy was done and patient was treated for H. Pylori.Known history of diabetes in August 2018 hemoglobin A1c was 7.0 Review of Systems:    Pertinent positives include: Right leg pain and swelling  Constitutional:  No weight loss, night sweats, Fevers, chills, fatigue, weight loss  HEENT:  No headaches, Difficulty  swallowing,Tooth/dental problems,Sore throat,  No sneezing, itching, ear ache, nasal congestion, post nasal drip,  Cardio-vascular:  No chest pain, Orthopnea, PND, anasarca, dizziness, palpitations.no Bilateral lower extremity swelling  GI:  No heartburn, indigestion, abdominal pain, nausea, vomiting, diarrhea, change in bowel habits, loss of appetite, melena, blood in stool, hematemesis Resp:  no shortness of breath at rest. No dyspnea on exertion, No excess mucus, no productive cough, No non-productive cough, No coughing up of blood.No change in color of mucus.No wheezing. Skin:  no rash or lesions. No jaundice GU:  no dysuria, change in color of urine, no urgency or frequency. No straining to urinate.  No flank pain.  Musculoskeletal:  No joint pain or no joint swelling. No decreased range of motion. No back pain.  Psych:  No change in mood or affect. No depression or anxiety. No memory loss.  Neuro: no localizing neurological complaints, no tingling, no weakness, no double vision, no gait abnormality, no slurred speech, no confusion  As per HPI otherwise 10 point review of systems negative.   Past Medical History: Past Medical History:  Diagnosis Date  . B12 deficiency   . [place]  start on xarelto  LLE--  stopped in 03/ 2016 due to GI bleed   last doppler 02-09-2015 chronic left common femoral and popliteal dvt's (mild partial resolution since dx 2015)  . CKD (chronic kidney disease), stage II   . Coronary artery disease cardiologist-  dr berry   a. 10/1998 Cath: LM 30d, LAD 726mOM 50, RCA 100 w/ L->R collats, nl LV ->Med Rx;  b. 07/2010 MV: no ischemia.  . DDD (degenerative disc disease), cervical   . GERD (gastroesophageal reflux disease)   . Hiatal hernia   . History of adenomatous polyp of colon   . History of balanitis    05/ 2017  . History of duodenal ulcer    w/ GI bleed in 2015 and 2016  . History of GI bleed    secondary  to upper duodenal ulcer's 2015 and 2016   . History of Helicobacter pylori infection    2015  . History of kidney stones   . Hyperlipidemia   . Hypertension   . Ileo-anal pouch ulcer (HCC)    chronic  . Insomnia   . Lower urinary tract symptoms (LUTS)   . Osteoarthritis    knees  . Phimosis   . Rosacea   . Type 2 diabetes mellitus ([place]   last A1c 8.5 on 03-30-2016  . Ulcerative colitis followed by  dr jaUlice Dashyrtle   [facility] Wears [facility]ures    upper  .  Wears glasses    Past Surgical History:  Procedure Laterality Date  . CARDIAC CATHETERIZATION  11/15/1998   dr berry   30% distal left main, 75% mid LAD, 50% OM and total RCA with left to right collateral and normal LV function.  [name] CIRCUMCISION N/A 09/25/2016   Procedure: CIRCUMCISION ADULT;  Surgeon: Alexis Frock, MD;  Location: [facility];  Service: Urology;  Laterality: N/A;  . COMPLETION PROCTECTOMY/ CONSTRUCTION ILEAL J POUCH/ DIVERTING LOOP ILEOSTOMY/ EXTENSIVE LYSIS ADHESIONS  11-07-2004   [facility]  . ESOPHAGOGASTRODUODENOSCOPY N/A 03/16/2014   Procedure: ESOPHAGOGASTRODUODENOSCOPY (EGD);  Surgeon: Jerene Bears, MD;  Location: [facility] ENDOSCOPY;  Service: Endoscopy;  Laterality: N/A;  . FLEXIBLE SIGMOIDOSCOPY N/A 03/16/2014   Procedure: FLEXIBLE SIGMOIDOSCOPY;  Surgeon: Jerene Bears, MD;  Location: [facility] ENDOSCOPY;  Service: Endoscopy;  Laterality: N/A;  . KNEE ARTHROSCOPY Bilateral 1990's  . NM MYOCAR PERF WALL MOTION  08/06/2010   dr berry   Low risk nuclear study w/ no ischemia (no significant change compared to prior study)/  normal LV function and wall motion , ef 60%  . SUBTOTAL COLECTOMY  W/ END ILEOSTOMY  05/ 2005   [facility]  . TAKE DOWN LOOP ILEOSTOMY  01-28-2005    [facility]  . TRANSTHORACIC ECHOCARDIOGRAM  11/16/2007   mild concentric LVH,  ef >55%,  grade 1 diastolic dysfuction/  trivial MR and TR/  borderline RVE/  focal nodular thickening of noncoronary cusp of trileaflet AV/       Social  History:  Ambulatory  independently    reports that he quit smoking about 43 years ago. His smoking use included cigarettes. He has a 18.00 pack-year smoking history. he has never used smokeless tobacco. He reports that he drinks alcohol. He reports that he does not use drugs.  Allergies:  No Known Allergies     Family History:   Family History  Problem Relation Age of Onset  . Heart disease Mother   . Diabetes Mother   . Arthritis Mother   . Hypertension Mother   . COPD Father   . Emphysema Father   . Asthma Father   . Colon polyps Brother   . Colon cancer Neg Hx   . Prostate cancer Neg Hx     Medications: Prior to Admission medications   Medication Sig Start Date End Date Taking? Authorizing Provider  amLODipine (NORVASC) 10 MG tablet TAKE 1 TABLET BY MOUTH ONCE DAILY Patient taking differently: TAKE 10 mg TABLET BY MOUTH ONCE DAILY 12/16/17  Yes [name], MD  glipiZIDE (GLUCOTROL XL) 10 MG 24 hr tablet TAKE 1 TABLET BY MOUTH ONCE A DAY Patient taking differently: TAKE 10 mg TABLET BY MOUTH ONCE A DAY 02/09/18  Yes [name], MD  isosorbide mononitrate (IMDUR) 60 MG 24 hr tablet TAKE 1 TABLET BY MOUTH ONCE DAILY Patient taking differently: TAKE 60 mg  TABLET BY MOUTH ONCE DAILY 11/24/17  Yes [name], MD  lisinopril-hydrochlorothiazide (PRINZIDE,ZESTORETIC) 20-12.5 MG tablet Take 1 tablet by mouth daily. 09/23/16  Yes [name], MD  lovastatin (MEVACOR) 10 MG tablet Take 1 tablet (10 mg total) by mouth at bedtime. 09/04/16  Yes [name], MD  metFORMIN (GLUCOPHAGE) 1000 MG tablet TAKE 1 TABLET BY MOUTH TWICE A DAY WITH FOOD. Patient taking differently: TAKE 1000 mg  TABLET BY MOUTH TWICE A DAY WITH FOOD. 12/16/17  Yes [name], MD  metoprolol succinate (TOPROL-XL) 50 MG 24 hr tablet TAKE 1 AND  1/2 TABLETS BY MOUTH DAILY. TAKE WITH OR IMMEDIATELY FOLLOWING A MEAL Patient taking differently: TAKE 75 mg TABLETS BY MOUTH DAILY. TAKE  WITH OR IMMEDIATELY FOLLOWING A MEAL 09/14/17  Yes [name], MD  minocycline (MINOCIN,DYNACIN) 50 MG capsule Take 1 capsule by mouth every morning.  09/04/14  Yes [provider]  pantoprazole (PROTONIX) 40 MG tablet TAKE 1 TABLET BY MOUTH ONCE DAILY Patient taking differently: TAKE 40 mg  TABLET BY MOUTH ONCE DAILY 02/17/18  Yes Irene Shipper, MD  sitaGLIPtin (JANUVIA) 100 MG tablet Take 100 mg by mouth daily.   Yes [provider]  tiZANidine (ZANAFLEX) 4 MG tablet TAKE ONE TABLET BY MOUTH AT BEDTIME AS NEEDED Patient taking differently: TAKE 4 mg TABLET BY MOUTH AT BEDTIME AS NEEDED 02/17/18  Yes [name], MD  traMADol (ULTRAM) 50 MG tablet TAKE 2 TABLETS BY MOUTH 3 TIMES A DAY ASNEEDED FOR CHRONIC PAIN Patient taking differently: TAKE 100 mg TABLETS BY MOUTH 3 TIMES A DAY ASNEEDED FOR CHRONIC PAIN 02/10/18  Yes [name], MD  VICTOZA 18 MG/3ML SOPN INJECT 1.2MG UNDER THE SKIN ONCE A DAY 11/30/17  Yes [name], MD  zolpidem (AMBIEN) 10 MG tablet TAKE 1 TABLET BY MOUTH EVERY NIGHT AT BEDTIME AS NEEDED FOR INSOMNIA Patient taking differently: TAKE 10 mg TABLET BY MOUTH EVERY NIGHT AT BEDTIME AS NEEDED FOR INSOMNIA 11/16/17  Yes [name], MD  cyanocobalamin (,VITAMIN B-12,) 1000 MCG/ML injection INJECT 1 ML INTO THE MUSCLE EVERY 30 DAYS Patient not taking: Reported on 03/11/2018 01/24/18   [name], MD    Physical Exam: Patient Vitals for the past 24 hrs:  BP Temp Temp src Pulse Resp SpO2 Height Weight  03/11/18 1900 - - - - - - 5' 7"  (1.702 m) 99 kg (218 lb 4.1 oz)  03/11/18 1800 125/75 - - 80 18 97 % - -  03/11/18 1700 118/88 - - 86 18 95 % - -  03/11/18 1406 112/82 - - 85 16 99 % - -  03/11/18 0959 (!) 122/91 98.2 F (36.8 C) Oral 100 18 96 % - -    1. General:  in No Acute distress  well  -appearing 2. Psychological: Alert and  Oriented 3. Head/ENT:   Moist   Mucous Membranes                          Head Non traumatic,  neck supple                            Poor Dentition 4. SKIN:  decreased Skin turgor,  Skin clean Dry and intact no rash 5. Heart: Regular rate and rhythm no Murmur, no Rub or gallop 6. Lungs:  no wheezes or crackles   7. Abdomen: Soft, non-tender, Non distended  obese  bowel sounds present 8. Lower extremities: no clubbing, cyanosis,  right leg femur and superficial nodularity noted with tenderness to palpation  9. Neurologically Grossly intact, moving all 4 extremities equally 10. MSK: Normal range of motion   body mass index is 34.18 kg/m.   Labs on Admission: I have personally reviewed following labs and imaging studies  CBC: Recent Labs  Lab 03/11/18 1935  WBC 7.2  NEUTROABS 4.2  HGB 12.6*  HCT 39.6  MCV 79.7  PLT 569   Basic Metabolic Panel: Recent Labs  Lab 03/11/18 1935  NA 138  K 4.4  CL 104  CO2 24  GLUCOSE 89  BUN 19  CREATININE 1.33*  CALCIUM 9.2   GFR: Estimated Creatinine Clearance: 56.3 mL/min (A) (by C-G formula based on SCr of 1.33 mg/dL (H)).     UA  not ordered  Lab Results  Component Value Date   HGBA1C 7.0 (H) 08/12/2017    Estimated Creatinine Clearance: 56.3 mL/min (A) (by C-G formula based on SCr of 1.33 mg/dL (H)).  BNP (last 3 results) No results for input(s): PROBNP in the last 8760 hours.   ECG REPORT Not Obtained  Filed Weights   03/11/18 1900  Weight: 99 kg (218 lb 4.1 oz)     Cultures:    Component Value Date/Time   SDES URINE, RANDOM 04/06/2014 0204   SPECREQUEST NONE 04/06/2014 0204   CULT  04/06/2014 0204    INSIGNIFICANT GROWTH Performed at [facility] 04/07/2014 FINAL 04/06/2014 0204     Radiological Exams on Admission: No results found.  Chart has been reviewed   Assessment/Plan  72 y.o. male with medical history significant of CAD, HTN, HLD, obesity, CKD stage II, history of doing vaginal bleed in 2000 15/2016, DM type II, ulcerative colitis status post colectomy chronic  diastolic CHF  Admitted for lower extremity VD VT on heparin drip with plan to place IVC filter patient not a candidate for long-term anticoagulation due to history of GI bleed  Present on Admission: . DVT (deep venous thrombosis) (HCC) -continue heparin drip plan to admit for possible IVC filter placement vascular surgery consult appreciated evidence of superficial thrombosis as well supportive management . Ulcerative colitis ([place]) stable patient has been for the eye surgery for probably ischemic pouch ulceration currently no bleeding . Pure hypercholesterolemia stable continue home medications . Essential hypertension, benign currently stable continue home medications Norvasc, lisinopril and hold HCTZ, continue metoprolol . Diabetes mellitus type 2 with neurological manifestations ([place])-  - Order   moderate  SSI   -  check TSH and HgA1C  - Hold by mouth medications    . CAD (coronary artery disease) -  - chronic,restart aspirin when safe as per vascular surgery, patient been ocasionaly noncompliant at home with statin but continue,  beta blocker Toprolol,  continue Imdur currently asymptomatic    Other plan as per orders.  DVT prophylaxis: Heparin IV  Code Status:  FULL CODE   as per patient    Family Communication:   Family  at  Bedside  plan of care was discussed with   Wife,   Disposition Plan:    To home once workup is complete and patient is stable    Consults called: Vascular surgeon  Admission status:  obs   Level of care  tele     I have spent a total of 56 min on this admission   Kimberli Winne 03/11/2018, 9:41 PM    [facility]  Pager [phone]   after 2 AM please page floor coverage PA If 7AM-7PM, please contact the day team taking care of the patient  [email or web address]  Password [email or web address]

## 2018-03-11 NOTE — ED Provider Notes (Signed)
[name] Provider Note   CSN: [id number] Arrival date & time: 03/11/18  2395     History   Chief Complaint Chief Complaint  Patient presents with  . Leg Pain    HPI Manuel Grant is a 72 y.o. male.  HPI Patient presents with his wife who assists with the HPI. Patient has a notable history of DVT on the left side, as well as ulcerative colitis, now status post total colectomy. Notably, the patient was on Xarelto following a DVT on the left, but developed GI bleed, found to have hemoglobin have and is no longer a candidate for this medicine.  Patient now presents with increasing pain and swelling around the right leg, worse below the knee, but with medial lesions superior to the knee as well. He notes that since the swelling began, it has descended, with palpable nodules along the right medial thigh. No dyspnea, chest pain, fever, chills, nausea, vomiting, syncope.  Past Medical History:  Diagnosis Date  . B12 deficiency   . Chronic deep vein thrombosis (DVT) (Manuel Grant) dx 2015  start on xarelto  LLE--  stopped in 03/ 2016 due to GI bleed   last doppler 02-09-2015 chronic left common femoral and popliteal dvt's (mild partial resolution since dx 2015)  . CKD (chronic kidney disease), stage II   . Coronary artery disease cardiologist-  dr berry   a. 10/1998 Cath: LM 30d, LAD 41m OM 50, RCA 100 w/ L->R collats, nl LV ->Med Rx;  b. 07/2010 MV: no ischemia.  . DDD (degenerative disc disease), cervical   . GERD (gastroesophageal reflux disease)   . Hiatal hernia   . History of adenomatous polyp of colon   . History of balanitis    05/ 2017  . History of duodenal ulcer    w/ GI bleed in 2015 and 2016  . History of GI bleed    secondary to upper duodenal ulcer's 2015 and 2016   . History of Helicobacter pylori infection    2015  . History of kidney stones   . Hyperlipidemia   . Hypertension   . Ileo-anal pouch ulcer (HCC)    chronic    . Insomnia   . Lower urinary tract symptoms (LUTS)   . Osteoarthritis    knees  . Phimosis   . Rosacea   . Type 2 diabetes mellitus ([place]    last A1c 8.5 on 03-30-2016  . Ulcerative colitis followed by  [name]   [facility] . Wears dentures    upper  . Wears glasses     Patient Active Problem List   Diagnosis Date Noted  . Rectal bleed 02/08/2015  . Rectal bleeding 10/19/2014  . DVT (deep venous thrombosis) ([place] 04/07/2014  . Acute renal failure ([place] 04/05/2014  . Duodenal ulcer disease 03/16/2014  . Unstable angina ([place] 03/15/2014  . Microcytic hypochromic anemia 03/15/2014  . Osteoarthritis   . B12 deficiency 02/24/2012  . Diabetes mellitus type 2 with neurological manifestations ([place] 02/22/2012  . Essential hypertension, benign 02/22/2012  . Pure hypercholesterolemia 02/22/2012  . Insomnia 02/22/2012  . Ulcerative colitis ([place] 02/22/2012  . H/O colectomy 02/22/2012  . prednisone use, h/o of long-term use 02/22/2012  . Renal stones 02/22/2012  . CAD (coronary artery disease) 02/22/2012  . OA (osteoarthritis) 02/22/2012  . Hiatal hernia 02/22/2012    Past Surgical History:  Procedure Laterality Date  . CARDIAC CATHETERIZATION  11/15/1998  dr berry   30% distal left main, 75% mid LAD, 50% OM and total RCA with left to right collateral and normal LV function.  [name] CIRCUMCISION N/A 09/25/2016   Procedure: CIRCUMCISION ADULT;  Surgeon: Alexis Frock, MD;  Location: [facility];  Service: Urology;  Laterality: N/A;  . COMPLETION PROCTECTOMY/ CONSTRUCTION ILEAL J POUCH/ DIVERTING LOOP ILEOSTOMY/ EXTENSIVE LYSIS ADHESIONS  11-07-2004   [facility]  . ESOPHAGOGASTRODUODENOSCOPY N/A 03/16/2014   Procedure: ESOPHAGOGASTRODUODENOSCOPY (EGD);  Surgeon: Jerene Bears, MD;  Location: [facility] ENDOSCOPY;  Service: Endoscopy;  Laterality: N/A;  . FLEXIBLE SIGMOIDOSCOPY N/A 03/16/2014   Procedure: FLEXIBLE SIGMOIDOSCOPY;  Surgeon: Jerene Bears, MD;   Location: [facility] ENDOSCOPY;  Service: Endoscopy;  Laterality: N/A;  . KNEE ARTHROSCOPY Bilateral 1990's  . NM MYOCAR PERF WALL MOTION  08/06/2010   dr berry   Low risk nuclear study w/ no ischemia (no significant change compared to prior study)/  normal LV function and wall motion , ef 60%  . SUBTOTAL COLECTOMY  W/ END ILEOSTOMY  05/ 2005   [facility]  . TAKE DOWN LOOP ILEOSTOMY  01-28-2005    [facility]  . TRANSTHORACIC ECHOCARDIOGRAM  11/16/2007   mild concentric LVH,  ef >55%,  grade 1 diastolic dysfuction/  trivial MR and TR/  borderline RVE/  focal nodular thickening of noncoronary cusp of trileaflet AV/         Home Medications    Prior to Admission medications   Medication Sig Start Date End Date Taking? Authorizing Provider  amLODipine (NORVASC) 10 MG tablet TAKE 1 TABLET BY MOUTH ONCE DAILY Patient taking differently: TAKE 10 mg TABLET BY MOUTH ONCE DAILY 12/16/17  Yes [name], MD  glipiZIDE (GLUCOTROL XL) 10 MG 24 hr tablet TAKE 1 TABLET BY MOUTH ONCE A DAY Patient taking differently: TAKE 10 mg TABLET BY MOUTH ONCE A DAY 02/09/18  Yes [name], MD  isosorbide mononitrate (IMDUR) 60 MG 24 hr tablet TAKE 1 TABLET BY MOUTH ONCE DAILY Patient taking differently: TAKE 60 mg  TABLET BY MOUTH ONCE DAILY 11/24/17  Yes [name], MD  lisinopril-hydrochlorothiazide (PRINZIDE,ZESTORETIC) 20-12.5 MG tablet Take 1 tablet by mouth daily. 09/23/16  Yes [name], MD  lovastatin (MEVACOR) 10 MG tablet Take 1 tablet (10 mg total) by mouth at bedtime. 09/04/16  Yes [name], MD  metFORMIN (GLUCOPHAGE) 1000 MG tablet TAKE 1 TABLET BY MOUTH TWICE A DAY WITH FOOD. Patient taking differently: TAKE 1000 mg  TABLET BY MOUTH TWICE A DAY WITH FOOD. 12/16/17  Yes [name], MD  metoprolol succinate (TOPROL-XL) 50 MG 24 hr tablet TAKE 1 AND 1/2 TABLETS BY MOUTH DAILY. TAKE WITH OR IMMEDIATELY FOLLOWING A MEAL Patient taking differently: TAKE 75 mg TABLETS BY MOUTH DAILY.  TAKE WITH OR IMMEDIATELY FOLLOWING A MEAL 09/14/17  Yes [name], MD  minocycline (MINOCIN,DYNACIN) 50 MG capsule Take 1 capsule by mouth every morning.  09/04/14  Yes [provider]  pantoprazole (PROTONIX) 40 MG tablet TAKE 1 TABLET BY MOUTH ONCE DAILY Patient taking differently: TAKE 40 mg  TABLET BY MOUTH ONCE DAILY 02/17/18  Yes Irene Shipper, MD  sitaGLIPtin (JANUVIA) 100 MG tablet Take 100 mg by mouth daily.   Yes [provider]  tiZANidine (ZANAFLEX) 4 MG tablet TAKE ONE TABLET BY MOUTH AT BEDTIME AS NEEDED Patient taking differently: TAKE 4 mg TABLET BY MOUTH AT BEDTIME AS NEEDED 02/17/18  Yes [name], MD  traMADol Veatrice Bourbon)  50 MG tablet TAKE 2 TABLETS BY MOUTH 3 TIMES A DAY ASNEEDED FOR CHRONIC PAIN Patient taking differently: TAKE 100 mg TABLETS BY MOUTH 3 TIMES A DAY ASNEEDED FOR CHRONIC PAIN 02/10/18  Yes [name], MD  VICTOZA 18 MG/3ML SOPN INJECT 1.2MG UNDER THE SKIN ONCE A DAY 11/30/17  Yes [name], MD  zolpidem (AMBIEN) 10 MG tablet TAKE 1 TABLET BY MOUTH EVERY NIGHT AT BEDTIME AS NEEDED FOR INSOMNIA Patient taking differently: TAKE 10 mg TABLET BY MOUTH EVERY NIGHT AT BEDTIME AS NEEDED FOR INSOMNIA 11/16/17  Yes [name], MD  cyanocobalamin (,VITAMIN B-12,) 1000 MCG/ML injection INJECT 1 ML INTO THE MUSCLE EVERY 30 DAYS Patient not taking: Reported on 03/11/2018 01/24/18   [name], MD    Family History Family History  Problem Relation Age of Onset  . Heart disease Mother   . Diabetes Mother   . Arthritis Mother   . Hypertension Mother   . COPD Father   . Emphysema Father   . Asthma Father   . Colon polyps Brother   . Colon cancer Neg Hx   . Prostate cancer Neg Hx     Social History Social History   Tobacco Use  . Smoking status: Former Smoker    Packs/day: 2.00    Years: 9.00    Pack years: 18.00    Types: Cigarettes    Last attempt to quit: 12/28/1974    Years since quitting: 43.2  .  Smokeless tobacco: Never Used  Substance Use Topics  . Alcohol use: Yes    Alcohol/week: 0.0 oz    Comment: occ  . Drug use: No     Allergies   Patient has no known allergies.   Review of Systems Review of Systems  Constitutional:       Per HPI, otherwise negative  HENT:       Per HPI, otherwise negative  Respiratory:       Per HPI, otherwise negative  Cardiovascular:       Per HPI, otherwise negative  Gastrointestinal: Negative for vomiting.  Endocrine:       Negative aside from HPI  Genitourinary:       Neg aside from HPI   Musculoskeletal:       Per HPI, otherwise negative  Skin: Negative.   Neurological: Negative for syncope.     Physical Exam Updated Vital Signs BP 125/75   Pulse 80   Temp 98.2 F (36.8 C) (Oral)   Resp 18   Ht 5' 7"  (1.702 m)   Wt 99 kg (218 lb 4.1 oz)   SpO2 97%   BMI 34.18 kg/m   Physical Exam  Constitutional: He is oriented to person, place, and time. He appears well-developed. No distress.  HENT:  Head: Normocephalic and atraumatic.  Eyes: Conjunctivae and EOM are normal.  Cardiovascular: Normal rate and regular rhythm.  Pulmonary/Chest: Effort normal. No stridor. No respiratory distress.  Abdominal: He exhibits no distension.  Musculoskeletal: He exhibits edema and tenderness.  Throughout the right medial aspect of the leg, from the calf superiorly there are  palpable lesions, firm, tender to palpation   Neurological: He is alert and oriented to person, place, and time.  Skin: Skin is warm and dry.  Psychiatric: He has a normal mood and affect.  Nursing note and vitals reviewed.    ED Treatments / Results  Labs (all labs ordered are listed, but only abnormal results are displayed) Labs Reviewed  BASIC  METABOLIC PANEL - Abnormal; Notable for the following components:      Result Value   Creatinine, Ser 1.33 (*)    GFR calc non Af Amer 52 (*)    All other components within normal limits  CBC WITH DIFFERENTIAL/PLATELET  - Abnormal; Notable for the following components:   Hemoglobin 12.6 (*)    MCH 25.4 (*)    RDW 17.8 (*)    All other components within normal limits  HEPARIN LEVEL (UNFRACTIONATED)  CBC     Procedures Procedures (including critical care time)  Medications Ordered in ED Medications  heparin bolus via infusion 3,000 Units (not administered)  heparin ADULT infusion 100 units/mL (25000 units/257m sodium chloride 0.45%) (not administered)     Initial Impression / Assessment and Plan / ED Course  I have reviewed the triage vital signs and the nursing notes.  Pertinent labs & imaging results that were available during my care of the patient were reviewed by me and considered in my medical decision making (see chart for details).  Repeat exam the patient is in similar condition. I discussed ultrasound findings with him, with concern for DVT, we discussed options. He now states that he took 2 aspirin today as the swelling and swelling became worse. We discussed his history of Xarelto, GI bleed, aversion to Coumadin for similar reasons, and options for care.  Subsequently discussed patient's case with our vascular surgeon on call,Dr. BTrula Slade  Patient will be admitted, heparin drip, with consideration of IVC filter.  Continues to have no chest pain, no dyspnea, no syncope. I had a very lengthy conversation with the patient and his wife about options for care, and given his inability to tolerate oral anticoagulants, Coumadin, indication for consideration of IVC filter, and admission with heparin.  Update:, Labs reassuring, patient starting heparin drip, awaiting inpatient bed. Final Clinical Impressions(s) / ED Diagnoses   Deep vein thrombosis, right leg, initial encounter  CRITICAL CARE Performed by: RCarmin MuskratTotal critical care time: 40 minutes Critical care time was exclusive of separately billable procedures and treating other patients. Critical care was necessary to  treat or prevent imminent or life-threatening deterioration. Critical care was time spent personally by me on the following activities: development of treatment plan with patient and/or surrogate as well as nursing, discussions with consultants, evaluation of patient's response to treatment, examination of patient, obtaining history from patient or surrogate, ordering and performing treatments and interventions, ordering and review of laboratory studies, ordering and review of radiographic studies, pulse oximetry and re-evaluation of patient's condition.     LCarmin Muskrat MD 03/11/18 2206

## 2018-03-11 NOTE — ED Notes (Signed)
ED Provider at bedside. 

## 2018-03-12 DIAGNOSIS — I82431 Acute embolism and thrombosis of right popliteal vein: Secondary | ICD-10-CM

## 2018-03-12 DIAGNOSIS — M170 Bilateral primary osteoarthritis of knee: Secondary | ICD-10-CM | POA: Diagnosis present

## 2018-03-12 DIAGNOSIS — I5032 Chronic diastolic (congestive) heart failure: Secondary | ICD-10-CM | POA: Diagnosis present

## 2018-03-12 DIAGNOSIS — E1122 Type 2 diabetes mellitus with diabetic chronic kidney disease: Secondary | ICD-10-CM | POA: Diagnosis present

## 2018-03-12 DIAGNOSIS — E1149 Type 2 diabetes mellitus with other diabetic neurological complication: Secondary | ICD-10-CM

## 2018-03-12 DIAGNOSIS — Z7982 Long term (current) use of aspirin: Secondary | ICD-10-CM | POA: Diagnosis not present

## 2018-03-12 DIAGNOSIS — I2583 Coronary atherosclerosis due to lipid rich plaque: Secondary | ICD-10-CM | POA: Diagnosis not present

## 2018-03-12 DIAGNOSIS — K264 Chronic or unspecified duodenal ulcer with hemorrhage: Secondary | ICD-10-CM | POA: Diagnosis present

## 2018-03-12 DIAGNOSIS — K51918 Ulcerative colitis, unspecified with other complication: Secondary | ICD-10-CM | POA: Diagnosis not present

## 2018-03-12 DIAGNOSIS — Z87891 Personal history of nicotine dependence: Secondary | ICD-10-CM | POA: Diagnosis not present

## 2018-03-12 DIAGNOSIS — I82409 Acute embolism and thrombosis of unspecified deep veins of unspecified lower extremity: Secondary | ICD-10-CM | POA: Diagnosis not present

## 2018-03-12 DIAGNOSIS — I2510 Atherosclerotic heart disease of native coronary artery without angina pectoris: Secondary | ICD-10-CM | POA: Diagnosis present

## 2018-03-12 DIAGNOSIS — T466X6A Underdosing of antihyperlipidemic and antiarteriosclerotic drugs, initial encounter: Secondary | ICD-10-CM | POA: Diagnosis present

## 2018-03-12 DIAGNOSIS — I130 Hypertensive heart and chronic kidney disease with heart failure and stage 1 through stage 4 chronic kidney disease, or unspecified chronic kidney disease: Secondary | ICD-10-CM | POA: Diagnosis present

## 2018-03-12 DIAGNOSIS — I82811 Embolism and thrombosis of superficial veins of right lower extremities: Secondary | ICD-10-CM | POA: Diagnosis present

## 2018-03-12 DIAGNOSIS — Z6834 Body mass index (BMI) 34.0-34.9, adult: Secondary | ICD-10-CM | POA: Diagnosis not present

## 2018-03-12 DIAGNOSIS — K229 Disease of esophagus, unspecified: Secondary | ICD-10-CM | POA: Diagnosis present

## 2018-03-12 DIAGNOSIS — I824Z1 Acute embolism and thrombosis of unspecified deep veins of right distal lower extremity: Secondary | ICD-10-CM | POA: Diagnosis not present

## 2018-03-12 DIAGNOSIS — G4700 Insomnia, unspecified: Secondary | ICD-10-CM | POA: Diagnosis present

## 2018-03-12 DIAGNOSIS — E785 Hyperlipidemia, unspecified: Secondary | ICD-10-CM | POA: Diagnosis present

## 2018-03-12 DIAGNOSIS — I10 Essential (primary) hypertension: Secondary | ICD-10-CM

## 2018-03-12 DIAGNOSIS — M5030 Other cervical disc degeneration, unspecified cervical region: Secondary | ICD-10-CM | POA: Diagnosis present

## 2018-03-12 DIAGNOSIS — E7800 Pure hypercholesterolemia, unspecified: Secondary | ICD-10-CM | POA: Diagnosis not present

## 2018-03-12 DIAGNOSIS — K5190 Ulcerative colitis, unspecified, without complications: Secondary | ICD-10-CM | POA: Diagnosis present

## 2018-03-12 DIAGNOSIS — Z79899 Other long term (current) drug therapy: Secondary | ICD-10-CM | POA: Diagnosis not present

## 2018-03-12 DIAGNOSIS — R402413 Glasgow coma scale score 13-15, at hospital admission: Secondary | ICD-10-CM | POA: Diagnosis present

## 2018-03-12 DIAGNOSIS — I82401 Acute embolism and thrombosis of unspecified deep veins of right lower extremity: Secondary | ICD-10-CM | POA: Diagnosis present

## 2018-03-12 DIAGNOSIS — K219 Gastro-esophageal reflux disease without esophagitis: Secondary | ICD-10-CM | POA: Diagnosis present

## 2018-03-12 DIAGNOSIS — E669 Obesity, unspecified: Secondary | ICD-10-CM | POA: Diagnosis present

## 2018-03-12 DIAGNOSIS — N182 Chronic kidney disease, stage 2 (mild): Secondary | ICD-10-CM | POA: Diagnosis present

## 2018-03-12 DIAGNOSIS — E538 Deficiency of other specified B group vitamins: Secondary | ICD-10-CM | POA: Diagnosis present

## 2018-03-12 LAB — CBC
HCT: 42.7 % (ref 39.0–52.0)
HEMOGLOBIN: 14.1 g/dL (ref 13.0–17.0)
MCH: 26.2 pg (ref 26.0–34.0)
MCHC: 33 g/dL (ref 30.0–36.0)
MCV: 79.4 fL (ref 78.0–100.0)
Platelets: 193 10*3/uL (ref 150–400)
RBC: 5.38 MIL/uL (ref 4.22–5.81)
RDW: 18.1 % — ABNORMAL HIGH (ref 11.5–15.5)
WBC: 6.6 10*3/uL (ref 4.0–10.5)

## 2018-03-12 LAB — COMPREHENSIVE METABOLIC PANEL
ALK PHOS: 62 U/L (ref 38–126)
ALT: 31 U/L (ref 17–63)
AST: 39 U/L (ref 15–41)
Albumin: 4 g/dL (ref 3.5–5.0)
Anion gap: 10 (ref 5–15)
BUN: 15 mg/dL (ref 6–20)
CALCIUM: 9.5 mg/dL (ref 8.9–10.3)
CO2: 25 mmol/L (ref 22–32)
CREATININE: 1.25 mg/dL — AB (ref 0.61–1.24)
Chloride: 105 mmol/L (ref 101–111)
GFR calc non Af Amer: 56 mL/min — ABNORMAL LOW (ref >60)
GLUCOSE: 133 mg/dL — AB (ref 65–99)
Potassium: 4 mmol/L (ref 3.5–5.1)
Sodium: 140 mmol/L (ref 135–145)
Total Bilirubin: 0.6 mg/dL (ref 0.3–1.2)
Total Protein: 8.2 g/dL — ABNORMAL HIGH (ref 6.5–8.1)

## 2018-03-12 LAB — GLUCOSE, CAPILLARY
Glucose-Capillary: 122 mg/dL — ABNORMAL HIGH (ref 65–99)
Glucose-Capillary: 165 mg/dL — ABNORMAL HIGH (ref 65–99)
Glucose-Capillary: 258 mg/dL — ABNORMAL HIGH (ref 65–99)
Glucose-Capillary: 152 mg/dL — ABNORMAL HIGH (ref 65–99)

## 2018-03-12 LAB — HEPARIN LEVEL (UNFRACTIONATED)
Heparin Unfractionated: 0.5 IU/mL (ref 0.30–0.70)
Heparin Unfractionated: 0.21 [IU]/mL — ABNORMAL LOW (ref 0.30–0.70)

## 2018-03-12 LAB — PROTIME-INR
INR: 0.97
PROTHROMBIN TIME: 12.7 s (ref 11.4–15.2)

## 2018-03-12 LAB — MAGNESIUM: Magnesium: 1.9 mg/dL (ref 1.7–2.4)

## 2018-03-12 LAB — TSH: TSH: 0.884 u[IU]/mL (ref 0.350–4.500)

## 2018-03-12 LAB — PHOSPHORUS: Phosphorus: 3 mg/dL (ref 2.5–4.6)

## 2018-03-12 MED ORDER — HEPARIN BOLUS VIA INFUSION
2000.0000 [IU] | Freq: Once | INTRAVENOUS | Status: DC
  Filled 2018-03-12: qty 2000

## 2018-03-12 NOTE — Consult Note (Signed)
[facility]  Patient name: Manuel Grant MRN: [id number] DOB: 02/12/1946 Sex: male   REQUESTING PROVIDER:    ER   REASON FOR CONSULT:    DVT  HISTORY OF PRESENT ILLNESS:   Manuel Grant is a 72 y.o. male, who presented to the emergency department with pain and redness in his right leg.  He has a history of DVT however he was not a candidate for anticoagulation secondary to GI bleeding.  His bleeding was secondary to an ulcer within his J-pouch which he had performed for ulcerative colitis.  He was previously on Xarelto for left leg DVT.  His most recent endoscopy was in 2018, January, which showed chronic nonhealing pouch ulceration.  He has intermittent bleeding, felt to be secondary to ischemia.  He has not wished to undergo surgical intervention.  He has been taking aspirin.  The patient suffers from coronary artery disease.  He has a history of cardiac catheterization with no intervention.  He is medically managed for hypertension.  He is on an ACE inhibitor.  He is a former smoker.  He takes a statin for hypercholesterolemia.  PAST MEDICAL HISTORY    Past Medical History:  Diagnosis Date  . B12 deficiency   . Chronic deep vein thrombosis (DVT) ([place]) dx 2015  start on xarelto  LLE--  stopped in 03/ 2016 due to GI bleed   last doppler 02-09-2015 chronic left common femoral and popliteal dvt's (mild partial resolution since dx 2015)  . CKD (chronic kidney disease), stage II   . Coronary artery disease cardiologist-  dr berry   a. 10/1998 Cath: LM 30d, LAD 33m OM 50, RCA 100 w/ L->R collats, nl LV ->Med Rx;  b. 07/2010 MV: no ischemia.  . DDD (degenerative disc disease), cervical   . GERD (gastroesophageal reflux disease)   . Hiatal hernia   . History of adenomatous polyp of colon   . History of balanitis    05/ 2017  . History of duodenal ulcer    w/ GI bleed in 2015 and 2016  . History of GI bleed    secondary to upper duodenal ulcer's 2015 and 2016   . History of Helicobacter pylori infection    2015  . History of kidney stones   . Hyperlipidemia   . Hypertension   . Ileo-anal pouch ulcer (HCC)    chronic  . Insomnia   . Lower urinary tract symptoms (LUTS)   . Osteoarthritis    knees  . Phimosis   . Rosacea   . Type 2 diabetes mellitus ([place]    last A1c 8.5 on 03-30-2016  . Ulcerative colitis followed by  [name]   [facility] . [facility]    upper  . Wears glasses      FAMILY HISTORY   Family History  Problem Relation Age of Onset  . Heart disease Mother   . Diabetes Mother   . Arthritis Mother   . Hypertension Mother   . COPD Father   . Emphysema Father   . Asthma Father   . Colon polyps Brother   . Colon cancer Neg Hx   . Prostate cancer Neg Hx     SOCIAL HISTORY:   Social History   Socioeconomic History  . Marital status: Married    Spouse name: Not on file  . Number of children: 2  . Years of education: Not on file  .  Highest education level: Not on file  Social Needs  . Financial resource strain: Not on file  . Food insecurity - worry: Not on file  . Food insecurity - inability: Not on file  . Transportation needs - medical: Not on file  . Transportation needs - non-medical: Not on file  Occupational History  . Occupation: retired  Tobacco Use  . Smoking status: Former Smoker    Packs/day: 2.00    Years: 9.00    Pack years: 18.00    Types: Cigarettes    Last attempt to quit: 12/28/1974    Years since quitting: 43.2  . Smokeless tobacco: Never Used  Substance and Sexual Activity  . Alcohol use: Yes    Alcohol/week: 0.0 oz    Comment: occ  . Drug use: No  . Sexual activity: Not on file  Other Topics Concern  . Not on file  Social History Narrative   Married 1967   Retired from [employer] work   Lives in [place] with his wife.    ALLERGIES:    Not on File  CURRENT MEDICATIONS:    Current  Facility-Administered Medications  Medication Dose Route Frequency Provider Last Rate Last Dose  . acetaminophen (TYLENOL) tablet 650 mg  650 mg Oral Q6H PRN Toy Baker, MD       Or  . acetaminophen (TYLENOL) suppository 650 mg  650 mg Rectal Q6H PRN Doutova, Anastassia, MD      . amLODipine (NORVASC) tablet 10 mg  10 mg Oral Daily Doutova, Anastassia, MD      . heparin ADULT infusion 100 units/mL (25000 units/259m sodium chloride 0.45%)  1,300 Units/hr Intravenous Continuous MRolla Flatten [facility]hr at 03/11/18 2031 1,300 Units/hr at 03/11/18 2031  . HYDROcodone-acetaminophen (NORCO/VICODIN) 5-325 MG per tablet 1-2 tablet  1-2 tablet Oral Q4H PRN Doutova, Anastassia, MD      . insulin aspart (novoLOG) injection 0-15 Units  0-15 Units Subcutaneous TID WC Doutova, Anastassia, MD      . insulin aspart (novoLOG) injection 0-5 Units  0-5 Units Subcutaneous QHS Doutova, Anastassia, MD      . isosorbide mononitrate (IMDUR) 24 hr tablet 60 mg  60 mg Oral Daily Doutova, Anastassia, MD      . lisinopril (PRINIVIL,ZESTRIL) tablet 20 mg  20 mg Oral Daily Doutova, Anastassia, MD      . metoprolol succinate (TOPROL-XL) 24 hr tablet 75 mg  75 mg Oral Daily Doutova, Anastassia, MD      . ondansetron (ZOFRAN) tablet 4 mg  4 mg Oral Q6H PRN Doutova, Anastassia, MD       Or  . ondansetron (ZOFRAN) injection 4 mg  4 mg Intravenous Q6H PRN Doutova, Anastassia, MD      . pravastatin (PRAVACHOL) tablet 10 mg  10 mg Oral q1800 Doutova, Anastassia, MD      . traMADol (ULTRAM) tablet 50 mg  50 mg Oral Q6H PRN Doutova, Anastassia, MD      . zolpidem (AMBIEN) tablet 5 mg  5 mg Oral QHS PRN DAlvira Philips RPH   5 mg at 03/12/18 0123    REVIEW OF SYSTEMS:   [X]  denotes positive finding, [ ]  denotes negative finding Cardiac  Comments:  Chest pain or chest pressure:    Shortness of breath upon exertion:    Short of breath when lying flat:    Irregular heart rhythm:        Vascular    Pain in  calf, thigh, or hip brought on  by ambulation:    Pain in feet at night that wakes you up from your sleep:     Blood clot in your veins:    Leg swelling:  x       Pulmonary    Oxygen at home:    Productive cough:     Wheezing:         Neurologic    Sudden weakness in arms or legs:     Sudden numbness in arms or legs:     Sudden onset of difficulty speaking or slurred speech:    Temporary loss of vision in one eye:     Problems with dizziness:         Gastrointestinal    Blood in stool:  x    Vomited blood:         Genitourinary    Burning when urinating:     Blood in urine:        Psychiatric    Major depression:         Hematologic    Bleeding problems:    Problems with blood clotting too easily:        Skin    Rashes or ulcers:        Constitutional    Fever or chills:     PHYSICAL EXAM:   Vitals:   03/11/18 2130 03/11/18 2238 03/12/18 0458 03/12/18 1000  BP: (!) 118/97 (!) 151/94 (!) 149/87   Pulse: 88 89 81   Resp: 17 16 16    Temp:  99.1 F (37.3 C) 98.5 F (36.9 C) 98 F (36.7 C)  TempSrc:  Oral Oral Oral  SpO2: 98% 99% 95%   Weight:      Height:        GENERAL: The patient is a well-nourished male, in no acute distress. The vital signs are documented above. CARDIAC: There is a regular rate and rhythm.  VASCULAR: Right leg tenderness PULMONARY: Nonlabored respirations ABDOMEN: Soft and non-tender with normal pitched bowel sounds.  MUSCULOSKELETAL: There are no major deformities or cyanosis. NEUROLOGIC: No focal weakness or paresthesias are detected. SKIN: There are no ulcers or rashes noted. PSYCHIATRIC: The patient has a normal affect.  STUDIES:   I have reviewed his ultrasound study which shows DVT in the posterior tibial veins and soleal vein.  There is also acute superficial thrombus within the saphenous vein from the mid calf to the proximal thigh, excluding the saphenofemoral junction.  ASSESSMENT and PLAN   Second episode of  unprovoked DVT: The patient's situation is complicated by the fact that he cannot be anticoagulated because of chronic GI bleeding.  I feel it is reasonable to admit him for IV heparin for stabilization of his thrombus and follow-up ultrasound next week to check for propagation.  Since the patient is not a candidate for anticoagulation, he could be considered for an IVC filter, however I am not sure this would be appropriate given that his DVT is limited to his tibial vein.  Further recommendations will be made based on repeat ultrasound on Monday.   Annamarie Major, MD [facility] [phone] Pager [phone]

## 2018-03-12 NOTE — Consult Note (Addendum)
Chief Complaint: Patient was seen in consultation today for retrievable inferior vena cava filter placement Chief Complaint  Patient presents with  . Leg Pain   at the request of Pfeiffer,Marcy  Referring Physician(s): Pfeiffer,Marcy  Supervising Physician: Daryll Brod  Patient Status: [facility] - In-pt  History of Present Illness: Manuel Grant is a 72 y.o. male   Pt admitted yesterday with right leg redness; tenderness Has hx of wood falling onto Rt shin 2 months ago-- all symptoms from that have resolved. Noted red "splotches: in rt posterior knee and thigh area for a few days Worsening pain [place]: Final Interpretation: Right: There is evidence of acute DVT in the Posterior Tibial veins, and Soleal vein. There is evidence of acute superficial thrombosis of the great saphenous vein, extending from calf up to proximal thigh, but not into saphenofemoral junction. Left: No evidence of common femoral vein obstruction.  Now on Heparin drip Contraindication to long term anticoagulation secondary GI bleed from Xarelto 1` yr ago. Had LLE DVT; treated with Xarelto-- new GI bleed + Duodenal ulcer Colonoscopy 12/2016 showed chronic non healing ulceration at pouch  Request for retrievable inferior vena cava filter placement  Discussed with Dr Annamaria Boots He approves procedure  Past Medical History:  Diagnosis Date  . B12 deficiency   . Chronic deep vein thrombosis (DVT) ([place]) dx 2015  start on xarelto  LLE--  stopped in 03/ 2016 due to GI bleed   last doppler 02-09-2015 chronic left common femoral and popliteal dvt's (mild partial resolution since dx 2015)  . CKD (chronic kidney disease), stage II   . Coronary artery disease cardiologist-  dr berry   a. 10/1998 Cath: LM 30d, LAD 64m OM 50, RCA 100 w/ L->R collats, nl LV ->Med Rx;  b. 07/2010 MV: no ischemia.  . DDD (degenerative disc disease), cervical   . GERD (gastroesophageal reflux disease)   . Hiatal hernia   . History of  adenomatous polyp of colon   . History of balanitis    05/ 2017  . History of duodenal ulcer    w/ GI bleed in 2015 and 2016  . History of GI bleed    secondary to upper duodenal ulcer's 2015 and 2016   . History of Helicobacter pylori infection    2015  . History of kidney stones   . Hyperlipidemia   . Hypertension   . Ileo-anal pouch ulcer (HCC)    chronic  . Insomnia   . Lower urinary tract symptoms (LUTS)   . Osteoarthritis    knees  . Phimosis   . Rosacea   . [place]    last A1c 8.5 on 03-30-2016  . Ulcerative colitis followed by  [name]   [facility] . Wears dentures    upper  . Wears glasses     Past Surgical History:  Procedure Laterality Date  . CARDIAC CATHETERIZATION  11/15/1998   dr berry   30% distal left main, 75% mid LAD, 50% OM and total RCA with left to right collateral and normal LV function.  .[name] KitchenCIRCUMCISION N/A 09/25/2016   Procedure: CIRCUMCISION ADULT;  Surgeon: TAlexis Frock MD;  [facility]  Service: Urology;  Laterality: N/A;  . COMPLETION PROCTECTOMY/ CONSTRUCTION ILEAL J POUCH/ DIVERTING LOOP ILEOSTOMY/ EXTENSIVE LYSIS ADHESIONS  11-07-2004   [facility]  . ESOPHAGOGASTRODUODENOSCOPY N/A 03/16/2014   Procedure: ESOPHAGOGASTRODUODENOSCOPY (EGD);  Surgeon: JJerene Bears MD;  [facility];  Service: Endoscopy;  Laterality: N/A;  . FLEXIBLE SIGMOIDOSCOPY N/A 03/16/2014   Procedure: FLEXIBLE SIGMOIDOSCOPY;  Surgeon: Jerene Bears, MD;  Location: [facility] ENDOSCOPY;  Service: Endoscopy;  Laterality: N/A;  . KNEE ARTHROSCOPY Bilateral 1990's  . NM MYOCAR PERF WALL MOTION  08/06/2010   dr berry   Low risk nuclear study w/ no ischemia (no significant change compared to prior study)/  normal LV function and wall motion , ef 60%  . SUBTOTAL COLECTOMY  W/ END ILEOSTOMY  05/ 2005   [facility]  . TAKE DOWN LOOP ILEOSTOMY  01-28-2005    [facility]  . TRANSTHORACIC ECHOCARDIOGRAM  11/16/2007   mild  concentric LVH,  ef >55%,  grade 1 diastolic dysfuction/  trivial MR and TR/  borderline RVE/  focal nodular thickening of noncoronary cusp of trileaflet AV/      Allergies: Patient has no allergy information on record.  Medications: Prior to Admission medications   Medication Sig Start Date End Date Taking? Authorizing Provider  amLODipine (NORVASC) 10 MG tablet TAKE 1 TABLET BY MOUTH ONCE DAILY Patient taking differently: TAKE 10 mg TABLET BY MOUTH ONCE DAILY 12/16/17  Yes [name], MD  glipiZIDE (GLUCOTROL XL) 10 MG 24 hr tablet TAKE 1 TABLET BY MOUTH ONCE A DAY Patient taking differently: TAKE 10 mg TABLET BY MOUTH ONCE A DAY 02/09/18  Yes [name], MD  isosorbide mononitrate (IMDUR) 60 MG 24 hr tablet TAKE 1 TABLET BY MOUTH ONCE DAILY Patient taking differently: TAKE 60 mg  TABLET BY MOUTH ONCE DAILY 11/24/17  Yes [name], MD  lisinopril-hydrochlorothiazide (PRINZIDE,ZESTORETIC) 20-12.5 MG tablet Take 1 tablet by mouth daily. 09/23/16  Yes [name], MD  lovastatin (MEVACOR) 10 MG tablet Take 1 tablet (10 mg total) by mouth at bedtime. 09/04/16  Yes [name], MD  metFORMIN (GLUCOPHAGE) 1000 MG tablet TAKE 1 TABLET BY MOUTH TWICE A DAY WITH FOOD. Patient taking differently: TAKE 1000 mg  TABLET BY MOUTH TWICE A DAY WITH FOOD. 12/16/17  Yes [name], MD  metoprolol succinate (TOPROL-XL) 50 MG 24 hr tablet TAKE 1 AND 1/2 TABLETS BY MOUTH DAILY. TAKE WITH OR IMMEDIATELY FOLLOWING A MEAL Patient taking differently: TAKE 75 mg TABLETS BY MOUTH DAILY. TAKE WITH OR IMMEDIATELY FOLLOWING A MEAL 09/14/17  Yes [name], MD  minocycline (MINOCIN,DYNACIN) 50 MG capsule Take 1 capsule by mouth every morning.  09/04/14  Yes [provider]  pantoprazole (PROTONIX) 40 MG tablet TAKE 1 TABLET BY MOUTH ONCE DAILY Patient taking differently: TAKE 40 mg  TABLET BY MOUTH ONCE DAILY 02/17/18  Yes Irene Shipper, MD  tiZANidine (ZANAFLEX) 4 MG  tablet TAKE ONE TABLET BY MOUTH AT BEDTIME AS NEEDED Patient taking differently: TAKE 4 mg TABLET BY MOUTH AT BEDTIME AS NEEDED 02/17/18  Yes [name], MD  traMADol (ULTRAM) 50 MG tablet TAKE 2 TABLETS BY MOUTH 3 TIMES A DAY ASNEEDED FOR CHRONIC PAIN Patient taking differently: TAKE 100 mg TABLETS BY MOUTH 3 TIMES A DAY ASNEEDED FOR CHRONIC PAIN 02/10/18  Yes [name], MD  VICTOZA 18 MG/3ML SOPN INJECT 1.2MG UNDER THE SKIN ONCE A DAY 11/30/17  Yes [name], MD  zolpidem (AMBIEN) 10 MG tablet TAKE 1 TABLET BY MOUTH EVERY NIGHT AT BEDTIME AS NEEDED FOR INSOMNIA Patient taking differently: TAKE 10 mg TABLET BY MOUTH EVERY NIGHT AT BEDTIME AS NEEDED FOR INSOMNIA 11/16/17  Yes [name], MD  cyanocobalamin (,VITAMIN  B-12,) 1000 MCG/ML injection INJECT 1 ML INTO THE MUSCLE EVERY 30 DAYS Patient not taking: Reported on 03/11/2018 01/24/18   [name], MD     Family History  Problem Relation Age of Onset  . Heart disease Mother   . Diabetes Mother   . Arthritis Mother   . Hypertension Mother   . COPD Father   . Emphysema Father   . Asthma Father   . Colon polyps Brother   . Colon cancer Neg Hx   . Prostate cancer Neg Hx     Social History   Socioeconomic History  . Marital status: Married    Spouse name: None  . Number of children: 2  . Years of education: None  . Highest education level: None  Social Needs  . Financial resource strain: None  . Food insecurity - worry: None  . Food insecurity - inability: None  . Transportation needs - medical: None  . Transportation needs - non-medical: None  Occupational History  . Occupation: retired  Tobacco Use  . Smoking status: Former Smoker    Packs/day: 2.00    Years: 9.00    Pack years: 18.00    Types: Cigarettes    Last attempt to quit: 12/28/1974    Years since quitting: 43.2  . Smokeless tobacco: Never Used  Substance and Sexual Activity  . Alcohol use: Yes    Alcohol/week: 0.0 oz     Comment: occ  . Drug use: No  . Sexual activity: None  Other Topics Concern  . None  Social History Narrative   Married 1967   Retired from [employer] work   Lives in [place] with his wife.    Review of Systems: A 12 point ROS discussed and pertinent positives are indicated in the HPI above.  All other systems are negative.  Review of Systems  Constitutional: Negative for activity change, appetite change, fatigue and fever.  Respiratory: Negative for cough and shortness of breath.   Cardiovascular: Negative for chest pain.  Gastrointestinal: Negative for abdominal pain.  Musculoskeletal: Negative for back pain.  Neurological: Negative for weakness.  Psychiatric/Behavioral: Negative for behavioral problems and confusion.    Vital Signs: BP (!) 149/87 (BP Location: Left Arm)   Pulse 81   Temp 98.5 F (36.9 C) (Oral)   Resp 16   Ht 5' 7"  (1.702 m)   Wt 218 lb 4.1 oz (99 kg)   SpO2 95%   BMI 34.18 kg/m   Physical Exam  Constitutional: He is oriented to person, place, and time.  Cardiovascular: Normal rate, regular rhythm and normal heart sounds.  Pulmonary/Chest: Effort normal and breath sounds normal.  Abdominal: Soft. Bowel sounds are normal.  Musculoskeletal: Normal range of motion.  Noted are reddened areas posterior right knee and thigh Tender to touch No swelling appreciated  Neurological: He is alert and oriented to person, place, and time.  Skin: Skin is warm and dry.  Psychiatric: He has a normal mood and affect. His behavior is normal. Judgment and thought content normal.  Vitals reviewed.   Imaging: No results found.  Labs:  CBC: Recent Labs    08/12/17 0853 03/11/18 1935 03/12/18 0739  WBC 5.9 7.2 6.6  HGB 13.8 12.6* 14.1  HCT 43.2 39.6 42.7  PLT 132* 177 193    COAGS: No results for input(s): INR, APTT in the last 8760 hours.  BMP: Recent Labs    08/12/17 0853 03/11/18 1935 03/12/18 0739  NA 141 138 140  K  4.5 4.4 4.0  CL 101  104 105  CO2 23 24 25   GLUCOSE 128* 89 133*  BUN 16 19 15   CALCIUM 9.4 9.2 9.5  CREATININE 1.28* 1.33* 1.25*  GFRNONAA 56* 52* 56*  GFRAA 64 >60 >60    LIVER FUNCTION TESTS: Recent Labs    08/12/17 0853 03/12/18 0739  BILITOT 0.4 0.6  AST 23 39  ALT 19 31  ALKPHOS 58 62  PROT 7.0 8.2*  ALBUMIN 4.0 4.0    TUMOR MARKERS: No results for input(s): AFPTM, CEA, CA199, CHROMGRNA in the last 8760 hours.  Assessment and Plan:  RtLE DVT per [place] Reddened tender areas at posterior rt knee and thigh Cannot long term anticoagulate secondary chronic non healing ulceration at duodenal pouch per colonoscopy 12/2016 Scheduled for tentative retrievable inferior vena cava filter placement Risks and benefits discussed with the patient including, but not limited to bleeding, infection, contrast induced renal failure, filter fracture or migration which can lead to emergency surgery or even death, strut penetration with damage or irritation to adjacent structures and caval thrombosis.  All of the patient's questions were answered, patient is agreeable to proceed. Consent signed and in chart.   Thank you for this interesting consult.  I greatly enjoyed meeting Manuel Grant and look forward to participating in their care.  A copy of this report was sent to the requesting provider on this date.  Electronically Signed: Lavonia Drafts, PA-C 03/12/2018, 9:41 AM   I spent a total of 40 Minutes    in face to face in clinical consultation, greater than 50% of which was counseling/coordinating care for retrievable inferior vena cava filter placement

## 2018-03-12 NOTE — Progress Notes (Signed)
ANTICOAGULATION CONSULT NOTE - Initial Consult  Pharmacy Consult for Heparin Indication: DVT  Not on File  Patient Measurements: Height: 5' 7"  (170.2 cm) Weight: 218 lb 4.1 oz (99 kg) IBW/kg (Calculated) : 66.1 Heparin Dosing Weight: 87  Vital Signs: Temp: 98.5 F (36.9 C) (03/16 0458) Temp Source: Oral (03/16 0458) BP: 149/87 (03/16 0458) Pulse Rate: 81 (03/16 0458)  Labs: Recent Labs    03/11/18 1935 03/12/18 0739  HGB 12.6* 14.1  HCT 39.6 42.7  PLT 177 193  HEPARINUNFRC  --  0.50  CREATININE 1.33* 1.25*    Estimated Creatinine Clearance: 59.9 mL/min (A) (by C-G formula based on SCr of 1.25 mg/dL (H)).   Medical History: Past Medical History:  Diagnosis Date  . B12 deficiency   . Chronic deep vein thrombosis (DVT) ([place]) dx 2015  start on xarelto  LLE--  stopped in 03/ 2016 due to GI bleed   last doppler 02-09-2015 chronic left common femoral and popliteal dvt's (mild partial resolution since dx 2015)  . CKD (chronic kidney disease), stage II   . Coronary artery disease cardiologist-  dr berry   a. 10/1998 Cath: LM 30d, LAD 25m OM 50, RCA 100 w/ L->R collats, nl LV ->Med Rx;  b. 07/2010 MV: no ischemia.  . DDD (degenerative disc disease), cervical   . GERD (gastroesophageal reflux disease)   . Hiatal hernia   . History of adenomatous polyp of colon   . History of balanitis    05/ 2017  . History of duodenal ulcer    w/ GI bleed in 2015 and 2016  . History of GI bleed    secondary to upper duodenal ulcer's 2015 and 2016   . History of Helicobacter pylori infection    2015  . History of kidney stones   . Hyperlipidemia   . Hypertension   . Ileo-anal pouch ulcer (HCC)    chronic  . Insomnia   . Lower urinary tract symptoms (LUTS)   . Osteoarthritis    knees  . Phimosis   . Rosacea   . [place] 2 diabetes mellitus [place]    last A1c 8.5 on 03-30-2016  . Ulcerative colitis followed by  [name]   [facility] W[facility]    upper  . Wears glasses     Ass[facility]33[facility] and found to have a DVT. The patient has history of prior DVTs treated with Xarelto however this was stopped in March 2016 in the setting of a GIB. Pharmacy has been consulted to start Heparin this admission for anticoagulation. Hep Wt: 87 kg. CBC stable. Scr trending down 1.33>1.25. No bleeding documented or noted.  Goal of Therapy:  Heparin level 0.3-0.7 units/ml Monitor platelets by anticoagulation protocol: Yes   Plan:  Continue heparin 1300 units/hr 8 hour confirmatory heparin level Daily heparin level, CBC Monitor clinical course, s/sx bleeding    TNida Boatman PharmD PGY1 Acute Care Pharmacy Resident Pager: [phone]/16/2019 8:57 AM

## 2018-03-12 NOTE — Progress Notes (Signed)
ANTICOAGULATION CONSULT NOTE - Follow Up  Pharmacy Consult for Heparin Indication: DVT  Not on File  Patient Measurements: Height: 5' 7"  (170.2 cm) Weight: 218 lb 4.1 oz (99 kg) IBW/kg (Calculated) : 66.1 Heparin Dosing Weight: 87  Labs: Recent Labs    03/11/18 1935 03/12/18 0739 03/12/18 1052 03/12/18 1714  HGB 12.6* 14.1  --   --   HCT 39.6 42.7  --   --   PLT 177 193  --   --   LABPROT  --   --  12.7  --   INR  --   --  0.97  --   HEPARINUNFRC  --  0.50  --  0.21*  CREATININE 1.33* 1.25*  --   --    Estimated Creatinine Clearance: 59.9 mL/min (A) (by C-G formula based on SCr of 1.25 mg/dL (H)).  Assessment: 24 YOM who [facility] with RLE pain and found to have a DVT. The patient has history of prior DVTs treated with Xarelto however this was stopped in March 2016 in the setting of a GIB. Pharmacy has been consulted to start Heparin this admission for anticoagulation. Hep Wt: 87 kg. CBC stable. Scr trending down 1.33>1.25. No bleeding documented or noted.  3/16 PM - HL - 0.21 on IV heparin rate of 1300 units/hr.  No noted IV site issues or bleeding.  Plans to continue IV heparin for 24-48 hours and then determine if he needs another colonoscopy.  Goal of Therapy:  Heparin level 0.3-0.7 units/ml Monitor platelets by anticoagulation protocol: Yes   Plan:  Increase IV heparin to 1500 units/hr (~ 15 ut/kg) - will check another heparin level with AM labs Daily heparin level, CBC Monitor clinical course, s/sx bleeding    Rober Minion, PharmD., MS Clinical Pharmacist Pager:  [phone] Thank you for allowing pharmacy to be part of this patients care team. 03/12/2018 6:19 PM

## 2018-03-12 NOTE — Progress Notes (Signed)
[facility]                                                                              Patient Demographics  Manuel Grant, is a 73 y.o. male, DOB - 1946/08/18, ZWC:[id number]  Admit date - 03/11/2018   Admitting Physician Toy Baker, MD  Outpatient Primary MD for the patient is Pickard, Cammie Mcgee, MD  Outpatient specialists:   LOS - 0  days   Medical records reviewed and are as summarized below:    Chief Complaint  Patient presents with  . Leg Pain       Brief summary   Patient is a 72 year old male with medical history significant of CAD, HTN, HLD, obesity, CKD stage II, DM type II, ulcerative colitis status post colectomy chronic diastolic CHF, presented with pain and redness of right lower extremity.  Patient has a history of prior DVT, had GI bleeding with anticoagulation, hence not on anticoagulation.  Denies any long trips, no history of malignancy.  Patient had left lower extremity DVT a year ago and was started on Xarelto, developed bleeding duodenal ulcer.  Has a history of chronic ulcerative colitis status post colectomy, colonoscopy in 12/2016 showed chronic nonhealing poulch ulceration, which has caused intermittent bleeding.  Assessment & Plan    Principal Problem:   RLE DVT (deep venous thrombosis) (HCC) -Doppler ultrasound of the right lower extremity showed DVT in the right PTV, soleal veins, superficial thrombosis in the right GSV, from calf of the groin but not in the saphenofemoral junction. -Currently asymptomatic, placed on heparin drip -Discussed in detail with vascular surgery, Dr. Trula Slade, will hold off on IVC filter placement for now, keep on heparin drip for 24-48 hours.  Will formulate further plan after that, if patient develops any anemia/bleeding, will not be on anti-coagulation candidate.  May need another colonoscopy to assess healing of the pouch ulcer.   Active Problems:   Diabetes mellitus type 2 with neurological  manifestations ([place]) -Continue sliding scale insulin, follow hemoglobin A1c    Essential hypertension, benign -Currently stable continue Norvasc, lisinopril, metoprolol    Pure hypercholesterolemia -Continue pravastatin    Ulcerative colitis ([place]) - Currently no bleeding or acute issues    CAD (coronary artery disease) -Currently no chest pain or shortness of breath, continue beta-blocker, Imdur  Code Status: Full CODE STATUS DVT Prophylaxis: Heparin drip Family Communication: Discussed in detail with the patient, all imaging results, lab results explained to the patient   Disposition Plan:   Time Spent in minutes 35 minutes   Procedures:  Doppler lower extremity DVT noted in the right PTV and soleal veins. Superficial thrombosis noted in the right GSV, from calf up to groin, but not into saphenofemoral junction    Consultants:   Vascular surgery, Dr. Trula Slade   Antimicrobials:      Medications  Scheduled Meds: . amLODipine  10 mg Oral Daily  . insulin aspart  0-15 Units Subcutaneous TID WC  . insulin aspart  0-5 Units Subcutaneous QHS  . isosorbide mononitrate  60 mg Oral Daily  . lisinopril  20 mg Oral Daily  . metoprolol succinate  75 mg  Oral Daily  . pravastatin  10 mg Oral q1800   Continuous Infusions: . heparin 1,300 Units/hr (03/11/18 2031)   PRN Meds:.acetaminophen **OR** acetaminophen, HYDROcodone-acetaminophen, ondansetron **OR** ondansetron (ZOFRAN) IV, traMADol, zolpidem   Antibiotics   Anti-infectives (From admission, onward)   None        Subjective:   Manuel Grant was seen and examined today.  Denies any specific complaints, no pain or swelling in the right leg. Patient denies dizziness, chest pain, shortness of breath, abdominal pain, N/V/D/C, new weakness, numbess, tingling. No acute events overnight.    Objective:   Vitals:   03/11/18 2130 03/11/18 2238 03/12/18 0458 03/12/18 1000  BP: (!) 118/97 (!) 151/94 (!) 149/87     Pulse: 88 89 81   Resp: 17 16 16    Temp:  99.1 F (37.3 C) 98.5 F (36.9 C) 98 F (36.7 C)  TempSrc:  Oral Oral Oral  SpO2: 98% 99% 95%   Weight:      Height:        Intake/Output Summary (Last 24 hours) at 03/12/2018 1110 Last data filed at 03/12/2018 0500 Gross per 24 hour  Intake 110.28 ml  Output 0 ml  Net 110.28 ml     Wt Readings from Last 3 Encounters:  03/11/18 99 kg (218 lb 4.1 oz)  08/16/17 97.5 kg (215 lb)  07/30/17 96.6 kg (213 lb)     Exam  General: Alert and oriented x 3, NAD  Eyes:  HEENT:  Atraumatic, normocephalic  Cardiovascular: S1 S2 auscultated, no rubs, murmurs or gallops. Regular rate and rhythm.  Respiratory: Clear to auscultation bilaterally, no wheezing, rales or rhonchi  Gastrointestinal: Soft, nontender, nondistended, + bowel sounds  Ext: no pedal edema bilaterally  Neuro: AAOx3, Cr N's II- XII. Strength 5/5 upper and lower extremities bilaterally  Musculoskeletal: No digital cyanosis, clubbing  Skin: No rashes  Psych: Normal affect and demeanor, alert and oriented x3    Data Reviewed:  I have personally reviewed following labs and imaging studies  Micro Results No results found for this or any previous visit (from the past 240 hour(s)).  Radiology Reports No results found.  Lab Data:  CBC: Recent Labs  Lab 03/11/18 1935 03/12/18 0739  WBC 7.2 6.6  NEUTROABS 4.2  --   HGB 12.6* 14.1  HCT 39.6 42.7  MCV 79.7 79.4  PLT 177 357   Basic Metabolic Panel: Recent Labs  Lab 03/11/18 1935 03/12/18 0739  NA 138 140  K 4.4 4.0  CL 104 105  CO2 24 25  GLUCOSE 89 133*  BUN 19 15  CREATININE 1.33* 1.25*  CALCIUM 9.2 9.5  MG  --  1.9  PHOS  --  3.0   GFR: Estimated Creatinine Clearance: 59.9 mL/min (A) (by C-G formula based on SCr of 1.25 mg/dL (H)). Liver Function Tests: Recent Labs  Lab 03/12/18 0739  AST 39  ALT 31  ALKPHOS 62  BILITOT 0.6  PROT 8.2*  ALBUMIN 4.0   No results for input(s): LIPASE,  AMYLASE in the last 168 hours. No results for input(s): AMMONIA in the last 168 hours. Coagulation Profile: No results for input(s): INR, PROTIME in the last 168 hours. Cardiac Enzymes: No results for input(s): CKTOTAL, CKMB, CKMBINDEX, TROPONINI in the last 168 hours. BNP (last 3 results) No results for input(s): PROBNP in the last 8760 hours. HbA1C: No results for input(s): HGBA1C in the last 72 hours. CBG: Recent Labs  Lab 03/11/18 2234 03/12/18 0729  GLUCAP 174* 122*  Lipid Profile: No results for input(s): CHOL, HDL, LDLCALC, TRIG, CHOLHDL, LDLDIRECT in the last 72 hours. Thyroid Function Tests: Recent Labs    03/12/18 0739  TSH 0.884   Anemia Panel: No results for input(s): VITAMINB12, FOLATE, FERRITIN, TIBC, IRON, RETICCTPCT in the last 72 hours. Urine analysis:    Component Value Date/Time   COLORURINE AMBER (A) 08/08/2015 1543   APPEARANCEUR CLOUDY (A) 08/08/2015 1543   LABSPEC 1.020 08/08/2015 1543   PHURINE 6.5 08/08/2015 1543   GLUCOSEU NEGATIVE 08/08/2015 1543   HGBUR 1+ (A) 08/08/2015 1543   BILIRUBINUR NEGATIVE 08/08/2015 1543   KETONESUR NEGATIVE 08/08/2015 1543   PROTEINUR 2+ (A) 08/08/2015 1543   UROBILINOGEN 0.2 04/05/2014 2028   NITRITE NEGATIVE 08/08/2015 1543   LEUKOCYTESUR 2+ (A) 08/08/2015 1543     Kiasha Bellin M.D. [facility] 03/12/2018, 11:10 AM  Pager: [phone] Between 7am to 7pm - call Pager - [phone]  After 7pm go to [email or web address] - password [email or web address]  Call night coverage person covering after 7pm

## 2018-03-13 LAB — BASIC METABOLIC PANEL
ANION GAP: 10 (ref 5–15)
BUN: 16 mg/dL (ref 6–20)
CALCIUM: 8.7 mg/dL — AB (ref 8.9–10.3)
CO2: 23 mmol/L (ref 22–32)
CREATININE: 1.3 mg/dL — AB (ref 0.61–1.24)
Chloride: 104 mmol/L (ref 101–111)
GFR calc non Af Amer: 53 mL/min — ABNORMAL LOW (ref >60)
Glucose, Bld: 225 mg/dL — ABNORMAL HIGH (ref 65–99)
Potassium: 3.6 mmol/L (ref 3.5–5.1)
SODIUM: 137 mmol/L (ref 135–145)

## 2018-03-13 LAB — CBC
HCT: 36.7 % — ABNORMAL LOW (ref 39.0–52.0)
Hemoglobin: 11.7 g/dL — ABNORMAL LOW (ref 13.0–17.0)
MCH: 25.1 pg — ABNORMAL LOW (ref 26.0–34.0)
MCHC: 31.9 g/dL (ref 30.0–36.0)
MCV: 78.8 fL (ref 78.0–100.0)
Platelets: 183 10*3/uL (ref 150–400)
RBC: 4.66 MIL/uL (ref 4.22–5.81)
RDW: 18 % — AB (ref 11.5–15.5)
WBC: 6.3 10*3/uL (ref 4.0–10.5)

## 2018-03-13 LAB — GLUCOSE, CAPILLARY
GLUCOSE-CAPILLARY: 124 mg/dL — AB (ref 65–99)
Glucose-Capillary: 158 mg/dL — ABNORMAL HIGH (ref 65–99)
Glucose-Capillary: 212 mg/dL — ABNORMAL HIGH (ref 65–99)
Glucose-Capillary: 167 mg/dL — ABNORMAL HIGH (ref 65–99)

## 2018-03-13 LAB — HEPARIN LEVEL (UNFRACTIONATED)
HEPARIN UNFRACTIONATED: 0.71 [IU]/mL — AB (ref 0.30–0.70)
HEPARIN UNFRACTIONATED: 0.72 [IU]/mL — AB (ref 0.30–0.70)

## 2018-03-13 MED ORDER — INSULIN GLARGINE 100 UNIT/ML ~~LOC~~ SOLN
5.0000 [IU] | Freq: Every day | SUBCUTANEOUS | Status: DC
  Administered 2018-03-13: 5 [IU] via SUBCUTANEOUS
  Filled 2018-03-13: qty 0.05

## 2018-03-13 NOTE — Progress Notes (Signed)
ANTICOAGULATION CONSULT NOTE - Follow Up  Pharmacy Consult for Heparin Indication: DVT  Not on File  Patient Measurements: Height: 5' 7"  (170.2 cm) Weight: 207 lb 6.4 oz (94.1 kg) IBW/kg (Calculated) : 66.1 Heparin Dosing Weight: 87 kg  Labs: Recent Labs    03/11/18 1935  03/12/18 0739 03/12/18 1052 03/12/18 1714 03/13/18 0517 03/13/18 1550  HGB 12.6*  --  14.1  --   --  11.7*  --   HCT 39.6  --  42.7  --   --  36.7*  --   PLT 177  --  193  --   --  183  --   LABPROT  --   --   --  12.7  --   --   --   INR  --   --   --  0.97  --   --   --   HEPARINUNFRC  --    < > 0.50  --  0.21* 0.71* 0.72*  CREATININE 1.33*  --  1.25*  --   --  1.30*  --    < > = values in this interval not displayed.   Estimated Creatinine Clearance: 56.2 mL/min (A) (by C-G formula based on SCr of 1.3 mg/dL (H)).  Assessment: 80 YOM who presented to the [facility] on 3/15 with RLE pain and found to have a DVT. The patient has history of prior DVTs treated with Xarelto however this was stopped in March 2016 in the setting of a GIB. Pharmacy has been consulted to start Heparin this admission for anticoagulation. Hep Wt: 87 kg. CBC stable. Scr stable around 1.3. No bleeding documented or noted.  Heparin level slightly above desired goal range still (0.72) on IV heparin rate of 1400 units/hr.  No noted bleeding but will decrease rate some to avoid going higher than goal.   Goal of Therapy:  Heparin level 0.3-0.7 units/ml Monitor platelets by anticoagulation protocol: Yes   Plan:  Decrease heparin to 1300 units/hr Daily heparin level, CBC - will recheck HL with AM labs since he is just above goal range. Monitor clinical course, s/sx bleeding   Rober Minion, PharmD., MS Clinical Pharmacist Pager:  [phone] Thank you for allowing pharmacy to be part of this patients care team. 03/13/2018 5:02 PM

## 2018-03-13 NOTE — Progress Notes (Signed)
[facility]                                                                              Patient Demographics  Manuel Grant, is a 73 y.o. male, DOB - 1946/08/19, TUU:[id number]  Admit date - 03/11/2018   Admitting Physician Toy Baker, MD  Outpatient Primary MD for the patient is Pickard, Cammie Mcgee, MD  Outpatient specialists:   LOS - 1  days   Medical records reviewed and are as summarized below:    Chief Complaint  Patient presents with  . Leg Pain       Brief summary   Patient is a 72 year old male with medical history significant of CAD, HTN, HLD, obesity, CKD stage II, DM type II, ulcerative colitis status post colectomy chronic diastolic CHF, presented with pain and redness of right lower extremity.  Patient has a history of prior DVT, had GI bleeding with anticoagulation, hence not on anticoagulation.  Denies any long trips, no history of malignancy.  Patient had left lower extremity DVT a year ago and was started on Xarelto, developed bleeding duodenal ulcer.  Has a history of chronic ulcerative colitis status post colectomy, colonoscopy in 12/2016 showed chronic nonhealing poulch ulceration, which has caused intermittent bleeding.  Assessment & Plan    Principal Problem:   RLE DVT (deep venous thrombosis) (HCC) -Doppler ultrasound of the right lower extremity showed DVT in the right PTV, soleal veins, superficial thrombosis in the right GSV, from calf of the groin but not in the saphenofemoral junction. -Continue heparin drip, per my discussion with Dr. Trula Slade, will obtain Doppler ultrasound of the right lower extremity in a.m. further management per vascular surgery  Active Problems:   Diabetes mellitus type 2 with neurological manifestations ([place]) -CBGs not well controlled, hemoglobin A1c was 7.0 on 08/12/2017 -Added Lantus 5 units at bedtime, continue sliding scale insulin, check hemoglobin A1c in a.m.    Essential hypertension,  benign -BP stable, continue Norvasc, lisinopril, metoprolol     Pure hypercholesterolemia -Continue pravastatin    Ulcerative colitis ([place]) - Currently no bleeding or acute issues    CAD (coronary artery disease) -Currently no chest pain or shortness of breath, continue beta-blocker, Imdur  Code Status: Full CODE STATUS DVT Prophylaxis: Heparin drip Family Communication: Discussed in detail with the patient, all imaging results, lab results explained to the patient   Disposition Plan:   Time Spent in minutes 25 minutes  Procedures:  Doppler lower extremity DVT noted in the right PTV and soleal veins. Superficial thrombosis noted in the right GSV, from calf up to groin, but not into saphenofemoral junction  Consultants:   Vascular surgery, Dr. Trula Slade   Antimicrobials:      Medications  Scheduled Meds: . amLODipine  10 mg Oral Daily  . insulin aspart  0-15 Units Subcutaneous TID WC  . insulin aspart  0-5 Units Subcutaneous QHS  . isosorbide mononitrate  60 mg Oral Daily  . lisinopril  20 mg Oral Daily  . metoprolol succinate  75 mg Oral Daily  . pravastatin  10 mg Oral q1800   Continuous Infusions: . heparin 1,400 Units/hr (03/13/18 0854)  PRN Meds:.acetaminophen **OR** acetaminophen, HYDROcodone-acetaminophen, ondansetron **OR** ondansetron (ZOFRAN) IV, traMADol, zolpidem   Antibiotics   Anti-infectives (From admission, onward)   None        Subjective:   Manuel Grant was seen and examined today.  No complaints this morning.  Pain and swelling in the right leg improved. Patient denies dizziness, chest pain, shortness of breath, abdominal pain, N/V/D/C, new weakness, numbess, tingling. No acute events overnight.    Objective:   Vitals:   03/12/18 1703 03/12/18 2142 03/13/18 0547 03/13/18 0900  BP: 129/86 122/80 132/71 127/74  Pulse: 91 82 81 82  Resp: 18 17 18 16   Temp: 98.2 F (36.8 C) 99 F (37.2 C) 98.1 F (36.7 C) 98.4 F (36.9 C)   TempSrc: Oral Oral Oral Oral  SpO2: 98% 98% 96% 98%  Weight:  94.1 kg (207 lb 6.4 oz)    Height:        Intake/Output Summary (Last 24 hours) at 03/13/2018 1113 Last data filed at 03/13/2018 0900 Gross per 24 hour  Intake 920 ml  Output 1 ml  Net 919 ml     Wt Readings from Last 3 Encounters:  03/12/18 94.1 kg (207 lb 6.4 oz)  08/16/17 97.5 kg (215 lb)  07/30/17 96.6 kg (213 lb)     Exam   General: Alert and oriented x 3, NAD  Eyes:   HEENT:    Cardiovascular: S1 S2 clear, RRR. No pedal edema b/l  Respiratory: CTAB  Gastrointestinal: Soft, nontender, nondistended, + bowel sounds  Ext: no pedal edema bilaterally  Neuro: no new deficits  Musculoskeletal: No digital cyanosis, clubbing  Skin: No rashes  Psych: Normal affect and demeanor, alert and oriented x3    Data Reviewed:  I have personally reviewed following labs and imaging studies  Micro Results No results found for this or any previous visit (from the past 240 hour(s)).  Radiology Reports No results found.  Lab Data:  CBC: Recent Labs  Lab 03/11/18 1935 03/12/18 0739 03/13/18 0517  WBC 7.2 6.6 6.3  NEUTROABS 4.2  --   --   HGB 12.6* 14.1 11.7*  HCT 39.6 42.7 36.7*  MCV 79.7 79.4 78.8  PLT 177 193 801   Basic Metabolic Panel: Recent Labs  Lab 03/11/18 1935 03/12/18 0739 03/13/18 0517  NA 138 140 137  K 4.4 4.0 3.6  CL 104 105 104  CO2 24 25 23   GLUCOSE 89 133* 225*  BUN 19 15 16   CREATININE 1.33* 1.25* 1.30*  CALCIUM 9.2 9.5 8.7*  MG  --  1.9  --   PHOS  --  3.0  --    GFR: Estimated Creatinine Clearance: 56.2 mL/min (A) (by C-G formula based on SCr of 1.3 mg/dL (H)). Liver Function Tests: Recent Labs  Lab 03/12/18 0739  AST 39  ALT 31  ALKPHOS 62  BILITOT 0.6  PROT 8.2*  ALBUMIN 4.0   No results for input(s): LIPASE, AMYLASE in the last 168 hours. No results for input(s): AMMONIA in the last 168 hours. Coagulation Profile: Recent Labs  Lab 03/12/18 1052   INR 0.97   Cardiac Enzymes: No results for input(s): CKTOTAL, CKMB, CKMBINDEX, TROPONINI in the last 168 hours. BNP (last 3 results) No results for input(s): PROBNP in the last 8760 hours. HbA1C: No results for input(s): HGBA1C in the last 72 hours. CBG: Recent Labs  Lab 03/12/18 0729 03/12/18 1150 03/12/18 1623 03/12/18 2132 03/13/18 0738  GLUCAP 122* 165* 258* 152* 124*  Lipid Profile: No results for input(s): CHOL, HDL, LDLCALC, TRIG, CHOLHDL, LDLDIRECT in the last 72 hours. Thyroid Function Tests: Recent Labs    03/12/18 0739  TSH 0.884   Anemia Panel: No results for input(s): VITAMINB12, FOLATE, FERRITIN, TIBC, IRON, RETICCTPCT in the last 72 hours. Urine analysis:    Component Value Date/Time   COLORURINE AMBER (A) 08/08/2015 1543   APPEARANCEUR CLOUDY (A) 08/08/2015 1543   LABSPEC 1.020 08/08/2015 1543   PHURINE 6.5 08/08/2015 1543   GLUCOSEU NEGATIVE 08/08/2015 1543   HGBUR 1+ (A) 08/08/2015 1543   BILIRUBINUR NEGATIVE 08/08/2015 1543   KETONESUR NEGATIVE 08/08/2015 1543   PROTEINUR 2+ (A) 08/08/2015 1543   UROBILINOGEN 0.2 04/05/2014 2028   NITRITE NEGATIVE 08/08/2015 1543   LEUKOCYTESUR 2+ (A) 08/08/2015 1543     Ripudeep Rai M.D. [facility] 03/13/2018, 11:13 AM  Pager: [phone] Between 7am to 7pm - call Pager - [phone]  After 7pm go to [email or web address] - password [email or web address]  Call night coverage person covering after 7pm

## 2018-03-13 NOTE — Progress Notes (Signed)
    Subjective  -   No interval change   Physical Exam:  Respirations nonlabored Extremities warm and well-perfused Abdomen soft nontender       Assessment/Plan:  right leg DVT  The patient's course of treatment is somewhat complicated given his history of GI bleed and J-pouch ulceration.  His bleeding was after approximately 9 months of Xarelto from his first DVT on the left leg.  He now has a recurrent unprovoked DVT in the right leg.  This is mainly in the posterior tibial vein without extension into the popliteal vein.  I discussed with the patient that I would like to repeat his venous ultrasound tomorrow morning to see if there has been any propagation of the DVT.  If there has not been any propagation, I would not recommend an IVC filter.  I would also like to talk with Dr.Pyrtle regarding his anticoagulation.  The patient reports melena after approximately 9 months of anticoagulation from his original DVT and this was the reason why his anticoagulation was stopped, knowing that he has a J-pouch ulcer, possibly due to ischemia.  The patient had refused surgical intervention potentially, the patient could have a short duration of anticoagulation shots as of 1 month.  If he absolutely cannot be anticoagulated, and there has been no clot propagation, he could potentially be discharged on aspirin with a follow-up in 1 week to evaluate for propagation of the clot.  Wells Jancarlo Biermann 03/13/2018 1:02 PM --  Vitals:   03/13/18 0547 03/13/18 0900  BP: 132/71 127/74  Pulse: 81 82  Resp: 18 16  Temp: 98.1 F (36.7 C) 98.4 F (36.9 C)  SpO2: 96% 98%    Intake/Output Summary (Last 24 hours) at 03/13/2018 1302 Last data filed at 03/13/2018 0900 Gross per 24 hour  Intake 680 ml  Output 1 ml  Net 679 ml     Laboratory CBC    Component Value Date/Time   WBC 6.3 03/13/2018 0517   HGB 11.7 (L) 03/13/2018 0517   HGB 11.6 (L) 05/24/2014 0817   HCT 36.7 (L) 03/13/2018 0517   HCT  37.3 (L) 05/24/2014 0817   PLT 183 03/13/2018 0517   PLT 152 05/24/2014 0817    BMET    Component Value Date/Time   NA 137 03/13/2018 0517   NA 142 05/24/2014 0817   K 3.6 03/13/2018 0517   K 3.9 05/24/2014 0817   CL 104 03/13/2018 0517   CO2 23 03/13/2018 0517   CO2 26 05/24/2014 0817   GLUCOSE 225 (H) 03/13/2018 0517   GLUCOSE 172 (H) 05/24/2014 0817   BUN 16 03/13/2018 0517   BUN 14.5 05/24/2014 0817   CREATININE 1.30 (H) 03/13/2018 0517   CREATININE 1.28 (H) 08/12/2017 0853   CREATININE 1.1 05/24/2014 0817   CALCIUM 8.7 (L) 03/13/2018 0517   CALCIUM 9.7 05/24/2014 0817   GFRNONAA 53 (L) 03/13/2018 0517   GFRNONAA 56 (L) 08/12/2017 0853   GFRAA >60 03/13/2018 0517   GFRAA 64 08/12/2017 0853    COAG Lab Results  Component Value Date   INR 0.97 03/12/2018   INR 1.08 02/08/2015   INR 1.02 04/05/2014   No results found for: PTT  Antibiotics Anti-infectives (From admission, onward)   None       V. Leia Alf, M.D. [facility] Office: [phone] Pager:  [phone]

## 2018-03-13 NOTE — Progress Notes (Signed)
ANTICOAGULATION CONSULT NOTE - Follow Up  Pharmacy Consult for Heparin Indication: DVT  Not on File  Patient Measurements: Height: 5' 7"  (170.2 cm) Weight: 207 lb 6.4 oz (94.1 kg) IBW/kg (Calculated) : 66.1 Heparin Dosing Weight: 87 kg  Labs: Recent Labs    03/11/18 1935 03/12/18 0739 03/12/18 1052 03/12/18 1714 03/13/18 0517  HGB 12.6* 14.1  --   --  11.7*  HCT 39.6 42.7  --   --  36.7*  PLT 177 193  --   --  183  LABPROT  --   --  12.7  --   --   INR  --   --  0.97  --   --   HEPARINUNFRC  --  0.50  --  0.21* 0.71*  CREATININE 1.33* 1.25*  --   --  1.30*   Estimated Creatinine Clearance: 56.2 mL/min (A) (by C-G formula based on SCr of 1.3 mg/dL (H)).  Assessment: 62 YOM who presented to the [facility] on 3/15 with RLE pain and found to have a DVT. The patient has history of prior DVTs treated with Xarelto however this was stopped in March 2016 in the setting of a GIB. Pharmacy has been consulted to start Heparin this admission for anticoagulation. Hep Wt: 87 kg. CBC stable. Scr stable around 1.3. No bleeding documented or noted.  Heparin level supratherapeutic, 0.71 after rate increase from 1300 to 1500 units/hr.   Goal of Therapy:  Heparin level 0.3-0.7 units/ml Monitor platelets by anticoagulation protocol: Yes   Plan:  Decrease heparin to 1400 units/hr 8 hour heparin level Daily heparin level, CBC Monitor clinical course, s/sx bleeding    Nida Boatman, PharmD PGY1 Acute Care Pharmacy Resident Pager: [phone] 03/13/2018 7:57 AM

## 2018-03-14 ENCOUNTER — Inpatient Hospital Stay (HOSPITAL_COMMUNITY): Payer: PPO

## 2018-03-14 DIAGNOSIS — I82409 Acute embolism and thrombosis of unspecified deep veins of unspecified lower extremity: Secondary | ICD-10-CM

## 2018-03-14 DIAGNOSIS — K51918 Ulcerative colitis, unspecified with other complication: Secondary | ICD-10-CM

## 2018-03-14 LAB — HEMOGLOBIN A1C
Hgb A1c MFr Bld: 7.2 % — ABNORMAL HIGH (ref 4.8–5.6)
Mean Plasma Glucose: 159.94 mg/dL

## 2018-03-14 LAB — GLUCOSE, CAPILLARY
GLUCOSE-CAPILLARY: 185 mg/dL — AB (ref 65–99)
Glucose-Capillary: 123 mg/dL — ABNORMAL HIGH (ref 65–99)

## 2018-03-14 LAB — CBC
HCT: 39.9 % (ref 39.0–52.0)
Hemoglobin: 12.9 g/dL — ABNORMAL LOW (ref 13.0–17.0)
MCH: 25.7 pg — AB (ref 26.0–34.0)
MCHC: 32.3 g/dL (ref 30.0–36.0)
MCV: 79.6 fL (ref 78.0–100.0)
Platelets: 205 10*3/uL (ref 150–400)
RBC: 5.01 MIL/uL (ref 4.22–5.81)
RDW: 18.5 % — AB (ref 11.5–15.5)
WBC: 6.9 10*3/uL (ref 4.0–10.5)

## 2018-03-14 LAB — BASIC METABOLIC PANEL
Anion gap: 11 (ref 5–15)
BUN: 15 mg/dL (ref 6–20)
CALCIUM: 9.3 mg/dL (ref 8.9–10.3)
CHLORIDE: 109 mmol/L (ref 101–111)
CO2: 21 mmol/L — AB (ref 22–32)
CREATININE: 1.26 mg/dL — AB (ref 0.61–1.24)
GFR calc Af Amer: >60 mL/min (ref >60)
GFR calc non Af Amer: 55 mL/min — ABNORMAL LOW (ref >60)
GLUCOSE: 119 mg/dL — AB (ref 65–99)
Potassium: 4.1 mmol/L (ref 3.5–5.1)
Sodium: 141 mmol/L (ref 135–145)

## 2018-03-14 LAB — HEPARIN LEVEL (UNFRACTIONATED): Heparin Unfractionated: 0.65 IU/mL (ref 0.30–0.70)

## 2018-03-14 MED ORDER — RIVAROXABAN 20 MG PO TABS
20.0000 mg | ORAL_TABLET | Freq: Every day | ORAL | Status: DC
  Filled 2018-03-14: qty 1

## 2018-03-14 MED ORDER — RIVAROXABAN 20 MG PO TABS: 20.0000 mg | ORAL_TABLET | Freq: Every day | ORAL | 0 refills | Status: DC

## 2018-03-14 MED ORDER — HEPARIN (PORCINE) IN NACL 100-0.45 UNIT/ML-% IJ SOLN
1200.0000 [IU]/h | INTRAMUSCULAR | Status: AC
  Administered 2018-03-14: 1200 [IU]/h via INTRAVENOUS

## 2018-03-14 NOTE — Progress Notes (Signed)
Patiient is OTF. RN to put IV consult when back to the unit.

## 2018-03-14 NOTE — Discharge Instructions (Signed)
Information on my medicine - XARELTO® (rivaroxaban) ° °WHY WAS XARELTO® PRESCRIBED FOR YOU? °Xarelto® was prescribed to treat blood clots that may have been found in the veins of your legs (deep vein thrombosis) or in your lungs (pulmonary embolism) and to reduce the risk of them occurring again. ° °What do you need to know about Xarelto®? °The dose is 20 mg tablet taken ONCE A DAY with your evening meal. ° °DO NOT stop taking Xarelto® without talking to the health care provider who prescribed the medication.  Refill your prescription for 20 mg tablets before you run out. ° °After discharge, you should have regular check-up appointments with your healthcare provider that is prescribing your Xarelto®.  In the future your dose may need to be changed if your kidney function changes by a significant amount. ° °What do you do if you miss a dose? °If you are taking Xarelto® TWICE DAILY and you miss a dose, take it as soon as you remember. You may take two 15 mg tablets (total 30 mg) at the same time then resume your regularly scheduled 15 mg twice daily the next day. ° °If you are taking Xarelto® ONCE DAILY and you miss a dose, take it as soon as you remember on the same day then continue your regularly scheduled once daily regimen the next day. Do not take two doses of Xarelto® at the same time.  ° °Important Safety Information °Xarelto® is a blood thinner medicine that can cause bleeding. You should call your healthcare provider right away if you experience any of the following: °? Bleeding from an injury or your nose that does not stop. °? Unusual colored urine (red or dark brown) or unusual colored stools (red or black). °? Unusual bruising for unknown reasons. °? A serious fall or if you hit your head (even if there is no bleeding). ° °Some medicines may interact with Xarelto® and might increase your risk of bleeding while on Xarelto®. To help avoid this, consult your healthcare provider or pharmacist prior to using  any new prescription or non-prescription medications, including herbals, vitamins, non-steroidal anti-inflammatory drugs (NSAIDs) and supplements. ° °This website has more information on Xarelto®: www.xarelto.com. ° °

## 2018-03-14 NOTE — Discharge Summary (Signed)
Physician Discharge Summary   Patient ID: Manuel Grant MRN: [id number] DOB/AGE: [birth year] 72 y.o.  Admit date: 03/11/2018 Discharge date: 03/14/2018  Primary Care Physician:  [name], MD   Recommendations for Outpatient Follow-up:  1. Follow up with Dr Trula Slade in 2 weeks 2. Please obtain BMP/CBC in one week 3. Patient started on xarelto 20 mg daily for one month as per vascular surgery recommendations  Home Health: None  Equipment/Devices: None  Discharge Condition: stable  CODE STATUS: FULL  Diet recommendation: carb  modified   Discharge Diagnoses:   .RLE  DVT (deep venous thrombosis) ([place]) . Ulcerative colitis ([place]) . Pure hypercholesterolemia . Essential hypertension, benign . Diabetes mellitus type 2 with neurological manifestations ([place]) . CAD (coronary artery disease)   Consults: vascular surgery    Allergies:  Not on File   DISCHARGE MEDICATIONS: Allergies as of 03/14/2018   Not on File     Medication List    TAKE these medications   amLODipine 10 MG tablet Commonly known as:  NORVASC TAKE 1 TABLET BY MOUTH ONCE DAILY What changed:    how much to take  how to take this  when to take this   glipiZIDE 10 MG 24 hr tablet Commonly known as:  GLUCOTROL XL TAKE 1 TABLET BY MOUTH ONCE A DAY What changed:    how much to take  how to take this  when to take this   isosorbide mononitrate 60 MG 24 hr tablet Commonly known as:  IMDUR TAKE 1 TABLET BY MOUTH ONCE DAILY What changed:    how much to take  how to take this  when to take this   lisinopril-hydrochlorothiazide 20-12.5 MG tablet Commonly known as:  PRINZIDE,ZESTORETIC Take 1 tablet by mouth daily.   lovastatin 10 MG tablet Commonly known as:  MEVACOR Take 1 tablet (10 mg total) by mouth at bedtime.   metFORMIN 1000 MG tablet Commonly known as:  GLUCOPHAGE TAKE 1 TABLET BY MOUTH TWICE A DAY WITH FOOD. What changed:    how much to take  how to take  this  when to take this   metoprolol succinate 50 MG 24 hr tablet Commonly known as:  TOPROL-XL TAKE 1 AND 1/2 TABLETS BY MOUTH DAILY. TAKE WITH OR IMMEDIATELY FOLLOWING A MEAL What changed:  See the new instructions.   minocycline 50 MG capsule Commonly known as:  MINOCIN,DYNACIN Take 1 capsule by mouth every morning.   pantoprazole 40 MG tablet Commonly known as:  PROTONIX TAKE 1 TABLET BY MOUTH ONCE DAILY What changed:    how much to take  how to take this  when to take this   rivaroxaban 20 MG Tabs tablet Commonly known as:  XARELTO Take 1 tablet (20 mg total) by mouth daily with supper. Start taking on:  03/15/2018   tiZANidine 4 MG tablet Commonly known as:  ZANAFLEX TAKE ONE TABLET BY MOUTH AT BEDTIME AS NEEDED What changed:    how much to take  how to take this  when to take this   traMADol 50 MG tablet Commonly known as:  ULTRAM TAKE 2 TABLETS BY MOUTH 3 TIMES A DAY ASNEEDED FOR CHRONIC PAIN What changed:  See the new instructions.   VICTOZA 18 MG/3ML Sopn Generic drug:  liraglutide INJECT 1.2MG UNDER THE SKIN ONCE A DAY   zolpidem 10 MG tablet Commonly known as:  AMBIEN TAKE 1 TABLET BY MOUTH EVERY NIGHT AT BEDTIME AS NEEDED FOR INSOMNIA What changed:  See  the new instructions.        Brief H and P: For complete details please refer to admission H and P, but in briefPatient is a 72 year old malewith medical history significant of CAD, HTN, HLD, obesity, CKD stage II, DM type II, ulcerative colitis status post colectomy chronic diastolic CHF, presented with pain and redness of right lower extremity.  Patient has a history of prior DVT, had GI bleeding with anticoagulation, hence not on anticoagulation.  Denies any long trips, no history of malignancy.  Patient had left lower extremity DVT a year ago and was started on Xarelto, developed bleeding duodenal ulcer.  Has a history of chronic ulcerative colitis status post colectomy, colonoscopy in 12/2016  showed chronic nonhealing poulch ulceration, which has caused intermittent bleeding.    Hospital Course:  RLE DVT (deep venous thrombosis) (HCC) -Doppler ultrasound of the right lower extremity showed DVT in the right PTV, soleal veins, superficial thrombosis in the right GSV, from calf of the groin but not in the saphenofemoral junction. - Patient was placed on heparin drip, has tolerated it for over 48 hours without any incidents of bleeding - Vascular surgery was consulted, patient seen by Dr. Trula Slade, recommended repeat ultrasound of the right lower extremity. Repeat US showed no propagation noted after eating either DVT or SVT since the previous study. - Discussed in detail with Dr. Trula Slade, recommended xarelto lower dose 20 mg daily with supper for 1 month. Patient's gastroenterologist, Dr Hilarie Fredrickson is currently unavailable, patient had previously tolerated xarelto for 9 months. - Dr. Trula Slade will follow the patient in 2 weeks in office, if patient has any further bleeding episodes, he will be taken off xarelto at that point and placed on aspirin.     Diabetes mellitus type 2 with neurological manifestations ([place]) -CBGs not well controlled, hemoglobin A1c was 7.0 on 08/12/2017 -Continue glipizide, metformin and Victoza    Essential hypertension, benign -BP stable, continue Norvasc, lisinopril, metoprolol     Pure hypercholesterolemia -Continue pravastatin    Ulcerative colitis ([place]) - Currently no bleeding or acute issues    CAD (coronary artery disease) -Currently no chest pain or shortness of breath, continue beta-blocker, Imdur  Day of Discharge S: Ambulating without any difficulty, anxious to go home  BP (!) 135/92 (BP Location: Right Arm)   Pulse 86   Temp 98.9 F (37.2 C) (Oral)   Resp 18   Ht 5' 7"  (1.702 m)   Wt 94.3 kg (208 lb)   SpO2 98%   BMI 32.58 kg/m   Physical Exam: General: Alert and awake oriented x3 not in any acute distress. HEENT: anicteric  sclera, pupils reactive to light and accommodation CVS: S1-S2 clear no murmur rubs or gallops Chest: clear to auscultation bilaterally, no wheezing rales or rhonchi Abdomen: soft nontender, nondistended, normal bowel sounds Extremities: no cyanosis, clubbing or edema noted bilaterally Neuro: Cranial nerves II-XII intact, no focal neurological deficits   The results of significant diagnostics from this hospitalization (including imaging, microbiology, ancillary and laboratory) are listed below for reference.      Procedures/Studies:  Right lower extremity venous dulex completed.   03/14/18  Preliminary report:  The DVT found in the right posterior tibial vein on 03/11/18, remains.  The thrombus in the right greater saphenous vein noted on 03/11/18, remains throughout the calf.  The Thrombus noted ni the soleal vein appears to be resolving.  There is no propagation noted of either the DVT or of the SVT since study done 03/11/18.  LAB RESULTS: Basic Metabolic Panel: Recent Labs  Lab 03/12/18 0739 03/13/18 0517 03/14/18 0620  NA 140 137 141  K 4.0 3.6 4.1  CL 105 104 109  CO2 25 23 21*  GLUCOSE 133* 225* 119*  BUN 15 16 15   CREATININE 1.25* 1.30* 1.26*  CALCIUM 9.5 8.7* 9.3  MG 1.9  --   --   PHOS 3.0  --   --    Liver Function Tests: Recent Labs  Lab 03/12/18 0739  AST 39  ALT 31  ALKPHOS 62  BILITOT 0.6  PROT 8.2*  ALBUMIN 4.0   No results for input(s): LIPASE, AMYLASE in the last 168 hours. No results for input(s): AMMONIA in the last 168 hours. CBC: Recent Labs  Lab 03/11/18 1935  03/13/18 0517 03/14/18 0620  WBC 7.2   < > 6.3 6.9  NEUTROABS 4.2  --   --   --   HGB 12.6*   < > 11.7* 12.9*  HCT 39.6   < > 36.7* 39.9  MCV 79.7   < > 78.8 79.6  PLT 177   < > 183 205   < > = values in this interval not displayed.   Cardiac Enzymes: No results for input(s): CKTOTAL, CKMB, CKMBINDEX, TROPONINI in the last 168 hours. BNP: Invalid input(s):  POCBNP CBG: Recent Labs  Lab 03/14/18 0738 03/14/18 1129  GLUCAP 123* 185*      Disposition and Follow-up: Discharge Instructions    Discharge instructions   Complete by:  As directed    Please take xarelto 52m daily with supper for 1 month. Follow with Dr BMarliss Czarin 1-2 weeks. If you have any bleeding episode, please stop xarelto and contact Dr BStephens Shireoffice immediately.   Increase activity slowly   Complete by:  As directed        DISPOSITION: home    DHollywood Warren T, MD. Schedule an appointment as soon as possible for a visit in 2 week(s).   Specialty:  Family Medicine Contact information: [phone]        BSerafina Mitchell MD. Schedule an appointment as soon as possible for a visit in 2 week(s).   Specialties:  Vascular Surgery, Cardiology Contact information: [address] [phone]           Time coordinating discharge:  363ms   Signed:   RiEstill Cotta.D. [facility] 03/14/2018, 12:53 PM Pager: [phone]

## 2018-03-14 NOTE — Care Management Note (Addendum)
Case Management Note  Patient Details  Name: Manuel Grant MRN: [id number] Date of Birth: 15-Jun-1946  Subjective/Objective:   History of CKD III, DVT not a candidate for anticoagulation with history of GI bleeding. Admitted for DVT            Action/Plan: Prior to admission patient lived at home with family.  Will be returning to the same living situation after discharge.  At discharge, patient has transportation home.  Patient has the ability to pay for medications/food.  In to speak with patient regarding re-start of Xarelto Medication, states this will be the first time filling it this year.  NCM ask him to see if they will allow him to use the free 30 day supply card for Xarelto.  Patient verbalized understanding.  Expected Discharge Date:      03/14/18            Expected Discharge Plan:  Home/Self Care  Discharge planning Services  CM Consult  Status of Service:  Completed, signed off  Kristen Cardinal, RN  Nurse case [place] 03/14/2018, 11:39 AM

## 2018-03-14 NOTE — Progress Notes (Signed)
VASCULAR LAB PRELIMINARY  PRELIMINARY  PRELIMINARY  PRELIMINARY  Right lower extremity venous dulex completed.    Preliminary report:  The DVT found in the right posterior tibial vein on 03/11/18, remains.  The thrombus in the right greater saphenous vein noted on 03/11/18, remains throughout the calf.  The Thrombus noted ni the soleal vein appears to be resolving.  There is no propagation noted of either the DVT or of the SVT since study done 03/11/18.  Kiyoshi Schaab, RVT 03/14/2018, 8:49 AM

## 2018-03-14 NOTE — Progress Notes (Addendum)
ANTICOAGULATION CONSULT NOTE - Follow Up  Pharmacy Consult for Heparin Indication: DVT  Not on File  Patient Measurements: Height: 5' 7"  (170.2 cm) Weight: 208 lb (94.3 kg) IBW/kg (Calculated) : 66.1 Heparin Dosing Weight: 87 kg  Assessment: 30 YOM who presented to the [facility] on 3/15 with RLE pain and found to have a DVT. On heparin gtt for new DVTs. Was on Xarelto in past but stopped in 2016 d/t GIB. Last heparin level therapeutic at 0.65. Heparin gtt stopped at 0630 this morning due to IV leaking. Restarted at 1100. Hgb 12.9, plts wnl.  Goal of Therapy:  Heparin level 0.3-0.7 units/ml Monitor platelets by anticoagulation protocol: Yes   Plan:  Restart heparin gtt at 1,200 units/hr Monitor daily heparin level, CBC, s/s of bleed Switch to PO agent today?  ADDENDUM:  SCr 1.26, CrCl ~76m/min. Per vascular and primary will not use starting active treatment dose of 142mBID.   Plan: Stop heparin gtt and start Xarelto 2032mO daily today   NatElenor QuinonesharmD, BCPPolaris Surgery Centerinical Pharmacist Pager [phone]/2019 12:07 PM

## 2018-03-14 NOTE — Progress Notes (Signed)
  Progress Note    03/14/2018 11:05 AM * No surgery found *  Subjective:  Anxiously anticipating discharge today.  Denies RLE pain or edema   Vitals:   03/14/18 0350 03/14/18 0924  BP: (!) 163/94 (!) 135/92  Pulse: 80 86  Resp: 18 18  Temp: 99.3 F (37.4 C) 98.9 F (37.2 C)  SpO2: 95% 98%   Physical Exam: Lungs:  Non labored Extremities:  No impressive edema; mild tenderness in area of SVT RLE Abdomen:  Soft Neurologic: A&O  CBC    Component Value Date/Time   WBC 6.9 03/14/2018 0620   RBC 5.01 03/14/2018 0620   HGB 12.9 (L) 03/14/2018 0620   HGB 11.6 (L) 05/24/2014 0817   HCT 39.9 03/14/2018 0620   HCT 37.3 (L) 05/24/2014 0817   PLT 205 03/14/2018 0620   PLT 152 05/24/2014 0817   MCV 79.6 03/14/2018 0620   MCV 81.1 05/24/2014 0817   MCH 25.7 (L) 03/14/2018 0620   MCHC 32.3 03/14/2018 0620   RDW 18.5 (H) 03/14/2018 0620   RDW 26.3 (H) 05/24/2014 0817   LYMPHSABS 2.2 03/11/2018 1935   LYMPHSABS 1.5 05/24/2014 0817   MONOABS 0.6 03/11/2018 1935   MONOABS 0.5 05/24/2014 0817   EOSABS 0.1 03/11/2018 1935   EOSABS 0.2 05/24/2014 0817   BASOSABS 0.0 03/11/2018 1935   BASOSABS 0.0 05/24/2014 0817    BMET    Component Value Date/Time   NA 141 03/14/2018 0620   NA 142 05/24/2014 0817   K 4.1 03/14/2018 0620   K 3.9 05/24/2014 0817   CL 109 03/14/2018 0620   CO2 21 (L) 03/14/2018 0620   CO2 26 05/24/2014 0817   GLUCOSE 119 (H) 03/14/2018 0620   GLUCOSE 172 (H) 05/24/2014 0817   BUN 15 03/14/2018 0620   BUN 14.5 05/24/2014 0817   CREATININE 1.26 (H) 03/14/2018 0620   CREATININE 1.28 (H) 08/12/2017 0853   CREATININE 1.1 05/24/2014 0817   CALCIUM 9.3 03/14/2018 0620   CALCIUM 9.7 05/24/2014 0817   GFRNONAA 55 (L) 03/14/2018 0620   GFRNONAA 56 (L) 08/12/2017 0853   GFRAA >60 03/14/2018 0620   GFRAA 64 08/12/2017 0853    INR    Component Value Date/Time   INR 0.97 03/12/2018 1052     Intake/Output Summary (Last 24 hours) at 03/14/2018 1105 Last  data filed at 03/14/2018 0925 Gross per 24 hour  Intake 1574.1 ml  Output 0 ml  Net 1574.1 ml     Assessment/Plan:  72 y.o. male with R leg DVT  -Ok for discharge from vascular surgery standpoint -Patient's treatment plan was discussed with Dr. Trula Slade -Prelim report RLE venous duplex did not demonstrate any propagation; no indication for IVC filter -Our recommendation would be for oral anticoagulation for at least 1 month; if absolutely unable to be anticoagulated due to history of GI bleed and J-pouch ulceration, then recommendation would be for d/c on aspirin -Office will call patient for follow up in 2 weeks with another venous duplex    Dagoberto Ligas, PA-C [facility] [phone] 03/14/2018 11:05 AM

## 2018-03-15 NOTE — Consult Note (Signed)
            [facility] CM Primary Care Navigator  03/15/2018  JAMIS KRYDER 11/06/1946 [id number]   Wentto seepatient at the bedside to identify possible discharge needs but he was already discharged homeyesterday per staff report.  Patient presented with pain and redness of right lower extremity with doppler ultrasound of the right lower extremity done and showed DVT (deep vein thrombosis- not a candidate for anticoagulation with history of GI bleeding).  Primary care provider's office is listed as providing transition of care (TOC) follow-up.  Patient has discharge instruction to follow-up with primary care provider and vascular surgery for a visit in 2 weeks.   For additional questions please contact:  Edwena Felty A. Madeleyn Schwimmer, BSN, RN-BC [facility] PRIMARY CARE Navigator Cell: [phone]

## 2018-03-16 ENCOUNTER — Other Ambulatory Visit: Payer: Self-pay | Admitting: Family Medicine

## 2018-03-16 NOTE — Telephone Encounter (Signed)
Ok to refill??  Last office visit 08/16/2017.  Last refill 02/10/2018.

## 2018-03-18 ENCOUNTER — Telehealth: Payer: Self-pay | Admitting: Surgery

## 2018-03-18 NOTE — Telephone Encounter (Signed)
Pt had no vm set up so I couldn't leave a message regarding his 03/28/18 appt. Mailed letter.

## 2018-03-18 NOTE — Telephone Encounter (Signed)
-----   Message from Mena Goes, RN sent at 03/14/2018 11:49 AM EDT ----- Regarding: appt VWB and RLE venous duplex in 2 weeks   ----- Message ----- From: Iline Oven Sent: 03/14/2018  11:12 AM To: Vvs Charge Pool  Can you schedule a follow up with REL venous duplex with Dr. Trula Slade in about 2 weeks.  RLE DVT. Thanks, [facility]

## 2018-03-25 ENCOUNTER — Other Ambulatory Visit: Payer: Self-pay

## 2018-03-25 DIAGNOSIS — I82401 Acute embolism and thrombosis of unspecified deep veins of right lower extremity: Secondary | ICD-10-CM

## 2018-03-28 ENCOUNTER — Other Ambulatory Visit: Payer: Self-pay

## 2018-03-28 ENCOUNTER — Ambulatory Visit: Payer: PPO | Admitting: Surgery

## 2018-03-28 ENCOUNTER — Ambulatory Visit (HOSPITAL_COMMUNITY)
Admission: RE | Admit: 2018-03-28 | Discharge: 2018-03-28 | Disposition: A | Discharge status: Home / Self Care | Payer: PPO | Source: Ambulatory Visit | Attending: Surgery | Admitting: Surgery

## 2018-03-28 ENCOUNTER — Encounter: Payer: Self-pay | Admitting: Surgery

## 2018-03-28 VITALS — BP 133/91 | HR 94 | Temp 98.8°F | Resp 20 | Ht 67.0 in | Wt 213.0 lb

## 2018-03-28 DIAGNOSIS — I82441 Acute embolism and thrombosis of right tibial vein: Secondary | ICD-10-CM | POA: Diagnosis not present

## 2018-03-28 DIAGNOSIS — M7989 Other specified soft tissue disorders: Secondary | ICD-10-CM | POA: Diagnosis present

## 2018-03-28 DIAGNOSIS — I82401 Acute embolism and thrombosis of unspecified deep veins of right lower extremity: Secondary | ICD-10-CM | POA: Diagnosis not present

## 2018-03-28 DIAGNOSIS — M79661 Pain in right lower leg: Secondary | ICD-10-CM | POA: Diagnosis present

## 2018-03-28 NOTE — Progress Notes (Signed)
[facility]  Patient name: Manuel Grant MRN: [id number] DOB: 1946-07-05 Sex: male   REASON FOR VISIT:    Follow up DVT  HISOTRY OF PRESENT ILLNESS:    Manuel Grant is a 72 y.o. male who returns today for hospital follow-up.  He was admitted on 316 with pain and redness in his right leg.  He was diagnosed with a DVT within his posterior tibial and soleal veins.  There is also acute thrombus within the saphenous vein from the mid calf to the proximal thigh.  The patient has a history of DVT, however his anticoagulation was discontinued because of a GI bleed.  Workup for his GI bleed revealed an ulcer within his J-pouch which he had performed for ulcerative colitis.  The patient was initially managed on heparin and then we transitioned him to Xarelto.  He had an ultrasound in the hospital that did not show any progression.  He is here today for follow-up.  Patient states that his leg is feeling better.  He is not having significant amount of swelling.  He denies any melena.   PAST MEDICAL HISTORY:   Past Medical History:  Diagnosis Date  . B12 deficiency   . Chronic deep vein thrombosis (DVT) ([place]) dx 2015  start on xarelto  LLE--  stopped in 03/ 2016 due to GI bleed   last doppler 02-09-2015 chronic left common femoral and popliteal dvt's (mild partial resolution since dx 2015)  . CKD (chronic kidney disease), stage II   . Coronary artery disease cardiologist-  dr berry   a. 10/1998 Cath: LM 30d, LAD 72m OM 50, RCA 100 w/ L->R collats, nl LV ->Med Rx;  b. 07/2010 MV: no ischemia.  . DDD (degenerative disc disease), cervical   . GERD (gastroesophageal reflux disease)   . Hiatal hernia   . History of adenomatous polyp of colon   . History of balanitis    05/ 2017  . History of duodenal ulcer    w/ GI bleed in 2015 and 2016  . History of GI bleed    secondary to upper duodenal ulcer's 2015 and 2016   .  History of Helicobacter pylori infection    2015  . History of kidney stones   . Hyperlipidemia   . Hypertension   . Ileo-anal pouch ulcer (HCC)    chronic  . Insomnia   . Lower urinary tract symptoms (LUTS)   . Osteoarthritis    knees  . Phimosis   . Rosacea   . [place] 2 [place]    last A1c 8.5 on 03-30-2016  . Ulcerative colitis followed by  [name]   [facility] . Wears dentures    upper  . Wears glasses      FAMILY HISTORY:   Family History  Problem Relation Age of Onset  . Heart disease Mother   . Diabetes Mother   . Arthritis Mother   . Hypertension Mother   . COPD Father   . Emphysema Father   . Asthma Father   . Colon polyps Brother   . Colon cancer Neg Hx   . Prostate cancer Neg Hx     SOCIAL HISTORY:   Social History   Tobacco Use  . Smoking status: Former Smoker    Packs/day: 2.00    Years: 9.00    Pack years: 18.00    Types: Cigarettes    Last attempt to  quit: 12/28/1974    Years since quitting: 43.2  . Smokeless tobacco: Never Used  Substance Use Topics  . Alcohol use: Yes    Alcohol/week: 0.0 oz    Comment: occ     ALLERGIES:   Not on File   CURRENT MEDICATIONS:   Current Outpatient Medications  Medication Sig Dispense Refill  . amLODipine (NORVASC) 10 MG tablet TAKE 1 TABLET BY MOUTH ONCE DAILY (Patient taking differently: TAKE 10 mg TABLET BY MOUTH ONCE DAILY) 90 tablet 1  . glipiZIDE (GLUCOTROL XL) 10 MG 24 hr tablet TAKE 1 TABLET BY MOUTH ONCE A DAY (Patient taking differently: TAKE 10 mg TABLET BY MOUTH ONCE A DAY) 30 tablet 1  . isosorbide mononitrate (IMDUR) 60 MG 24 hr tablet TAKE 1 TABLET BY MOUTH ONCE DAILY (Patient taking differently: TAKE 60 mg  TABLET BY MOUTH ONCE DAILY) 90 tablet 3  . lisinopril-hydrochlorothiazide (PRINZIDE,ZESTORETIC) 20-12.5 MG tablet Take 1 tablet by mouth daily. 90 tablet 2  . lovastatin (MEVACOR) 10 MG tablet Take 1 tablet (10 mg total) by mouth at bedtime.  30 tablet 7  . metFORMIN (GLUCOPHAGE) 1000 MG tablet TAKE 1 TABLET BY MOUTH TWICE A DAY WITH FOOD. (Patient taking differently: TAKE 1000 mg  TABLET BY MOUTH TWICE A DAY WITH FOOD.) 60 tablet 1  . metoprolol succinate (TOPROL-XL) 50 MG 24 hr tablet TAKE 1 AND 1/2 TABLETS BY MOUTH DAILY. TAKE WITH OR IMMEDIATELY FOLLOWING A MEAL (Patient taking differently: TAKE 75 mg TABLETS BY MOUTH DAILY. TAKE WITH OR IMMEDIATELY FOLLOWING A MEAL) 45 tablet 11  . minocycline (MINOCIN,DYNACIN) 50 MG capsule Take 1 capsule by mouth every morning.     . pantoprazole (PROTONIX) 40 MG tablet TAKE 1 TABLET BY MOUTH ONCE DAILY (Patient taking differently: TAKE 40 mg  TABLET BY MOUTH ONCE DAILY) 30 tablet 1  . rivaroxaban (XARELTO) 20 MG TABS tablet Take 1 tablet (20 mg total) by mouth daily with supper. 30 tablet 0  . tiZANidine (ZANAFLEX) 4 MG tablet TAKE ONE TABLET BY MOUTH AT BEDTIME AS NEEDED (Patient taking differently: TAKE 4 mg TABLET BY MOUTH AT BEDTIME AS NEEDED) 30 tablet 2  . traMADol (ULTRAM) 50 MG tablet TAKE 2 TABLETS BY MOUTH 3 TIMES A DAY ASNEEDED FOR CHRONIC PAIN 180 tablet 0  . VICTOZA 18 MG/3ML SOPN INJECT 1.2MG UNDER THE SKIN ONCE A DAY 6 mL 3  . zolpidem (AMBIEN) 10 MG tablet TAKE 1 TABLET BY MOUTH EVERY NIGHT AT BEDTIME AS NEEDED FOR INSOMNIA (Patient taking differently: TAKE 10 mg TABLET BY MOUTH EVERY NIGHT AT BEDTIME AS NEEDED FOR INSOMNIA) 30 tablet 1   No current facility-administered medications for this visit.     REVIEW OF SYSTEMS:   [X]  denotes positive finding, [ ]  denotes negative finding Cardiac  Comments:  Chest pain or chest pressure:    Shortness of breath upon exertion:    Short of breath when lying flat:    Irregular heart rhythm:        Vascular    Pain in calf, thigh, or hip brought on by ambulation:    Pain in feet at night that wakes you up from your sleep:     Blood clot in your veins:    Leg swelling:         Pulmonary    Oxygen at home:    Productive cough:       Wheezing:         Neurologic    Sudden weakness in  arms or legs:     Sudden numbness in arms or legs:     Sudden onset of difficulty speaking or slurred speech:    Temporary loss of vision in one eye:     Problems with dizziness:         Gastrointestinal    Blood in stool:     Vomited blood:         Genitourinary    Burning when urinating:     Blood in urine:        Psychiatric    Major depression:         Hematologic    Bleeding problems:    Problems with blood clotting too easily:        Skin    Rashes or ulcers:        Constitutional    Fever or chills:      PHYSICAL EXAM:   Vitals:   03/28/18 1113  BP: (!) 133/91  Pulse: 94  Resp: 20  Temp: 98.8 F (37.1 C)  TempSrc: Oral  SpO2: 97%  Weight: 213 lb (96.6 kg)  Height: 5' 7"  (1.702 m)    GENERAL: The patient is a well-nourished male, in no acute distress. The vital signs are documented above. CARDIAC: There is a regular rate and rhythm.  VASCULAR: Minimal edema to left leg PULMONARY: Non-labored respirations  MUSCULOSKELETAL: There are no major deformities or cyanosis. NEUROLOGIC: No focal weakness or paresthesias are detected. SKIN: There are no ulcers or rashes noted. PSYCHIATRIC: The patient has a normal affect.  STUDIES:   I have ordered and reviewed his ultrasound today.  There appears to be no progression of the DVT in the right leg.  MEDICAL ISSUES:   DVT: The patient understands that his case is somewhat unique.  Because of his history of GI bleed, and recurrent DVT, we have decided to treat him for 1 month with Xarelto.  His ultrasound today shows no propagation of the clot.  I have him scheduled to follow-up with me approximately 1-2 weeks after discontinuing the Xarelto.  He will also get a duplex to see if there is been any change in his DVT.  If not, no further treatment would be indicated.    Annamarie Major, MD [facility] [phone] Pager  [phone]

## 2018-03-29 ENCOUNTER — Ambulatory Visit (INDEPENDENT_AMBULATORY_CARE_PROVIDER_SITE_OTHER): Payer: PPO | Admitting: Family Medicine

## 2018-03-29 ENCOUNTER — Encounter: Payer: Self-pay | Admitting: Family Medicine

## 2018-03-29 VITALS — BP 132/82 | HR 88 | Temp 98.6°F | Resp 16 | Ht 66.75 in | Wt 210.0 lb

## 2018-03-29 DIAGNOSIS — I10 Essential (primary) hypertension: Secondary | ICD-10-CM | POA: Diagnosis not present

## 2018-03-29 DIAGNOSIS — K269 Duodenal ulcer, unspecified as acute or chronic, without hemorrhage or perforation: Secondary | ICD-10-CM

## 2018-03-29 DIAGNOSIS — E119 Type 2 diabetes mellitus without complications: Secondary | ICD-10-CM

## 2018-03-29 DIAGNOSIS — I82441 Acute embolism and thrombosis of right tibial vein: Secondary | ICD-10-CM

## 2018-03-29 DIAGNOSIS — I2510 Atherosclerotic heart disease of native coronary artery without angina pectoris: Secondary | ICD-10-CM

## 2018-03-29 NOTE — Progress Notes (Signed)
Subjective:    Patient ID: Manuel Grant, male    DOB: [birth year], 72 y.o.   MRN: [id number]  HPI Patient was recently admitted to the hospital with DVT in right posterior tibial vein, soleal vein and saphenous vein from calf to mid thigh.  I have copied relevant portions of the discharge summary below for my reference:   Brief H and P: For complete details please refer to admission H and P, but in briefPatient is a 72 year old malewith medical history significant of CAD, HTN, HLD, obesity, CKD stage II, DM type II, ulcerative colitis status post colectomy chronic diastolic CHF, presented with pain and redness of right lower extremity. Patient has a history of prior DVT, had GI bleeding with anticoagulation, hence not on anticoagulation. Denies any long trips, no history of malignancy. Patient had left lower extremity DVT a year ago and was started on Xarelto, developed bleeding duodenal ulcer. Has a history of chronic ulcerative colitis status post colectomy, colonoscopy in 12/2016 showed chronic nonhealing poulch ulceration, which has caused intermittent bleeding.    Hospital Course:  RLE DVT (deep venous thrombosis) (HCC) -Doppler ultrasound of the right lower extremity showed DVT in the right PTV, soleal veins, superficial thrombosis in the right GSV, from calf of the groin but not in the saphenofemoral junction. - Patient was placed on heparin drip, has tolerated it for over 48 hours without any incidents of bleeding - Vascular surgery was consulted, patient seen by Dr. Trula Slade, recommended repeat ultrasound of the right lower extremity. Repeat US showed no propagation noted after eating either DVT or SVT since the previous study. - Discussed in detail with Dr. Trula Slade, recommended xarelto lower dose 20 mg daily with supper for 1 month. Patient's gastroenterologist, Dr Hilarie Fredrickson is currently unavailable, patient had previously tolerated xarelto for 9 months. - Dr. Trula Slade will  follow the patient in 2 weeks in office, if patient has any further bleeding episodes, he will be taken off xarelto at that point and placed on aspirin.  Diabetes mellitus type 2 with neurological manifestations ([place]) -CBGs not well controlled, hemoglobin A1c was 7.0on8/16/2018 -Continue glipizide, metformin and Victoza  Essential hypertension, benign -BP stable, continue Norvasc, lisinopril, metoprolol  Pure hypercholesterolemia -Continue pravastatin  Ulcerative colitis ([place]) - Currently no bleeding or acute issues  CAD (coronary artery disease) -Currently no chest pain or shortness of breath, continue beta-blocker, Imdur  Here for follow up.  Patient is doing well on Xarelto 20 mg a day.  He is tolerating the medication without side effects.  He denies any melena or hematochezia.  He denies any abdominal pain.  He denies any hematemesis.  He denies any chest pain or pleurisy.  He is here today for hospital follow-up.  I also reviewed his lab work from the hospital.  Hemoglobin A1c obtained in the hospital was 7.2 which given age and medical comorbidities I believe is adequate.  He has not had a CBC since taking Xarelto.   Past Medical History:  Diagnosis Date  . B12 deficiency   . Chronic deep vein thrombosis (DVT) ([place]) dx 2015  start on xarelto  LLE--  stopped in 03/ 2016 due to GI bleed   last doppler 02-09-2015 chronic left common femoral and popliteal dvt's (mild partial resolution since dx 2015)  . CKD (chronic kidney disease), stage II   . Coronary artery disease cardiologist-  dr berry   a. 10/1998 Cath: LM 30d, LAD 45m OM 50, RCA 100 w/ L->R collats, nl LV ->Med  Rx;  b. 07/2010 MV: no ischemia.  . DDD (degenerative disc disease), cervical   . GERD (gastroesophageal reflux disease)   . Hiatal hernia   . History of adenomatous polyp of colon   . History of balanitis    05/ 2017  . History of duodenal ulcer    w/ GI bleed in 2015 and 2016  . History of GI  bleed    secondary to upper duodenal ulcer's 2015 and 2016   . History of Helicobacter pylori infection    2015  . History of kidney stones   . Hyperlipidemia   . Hypertension   . Ileo-anal pouch ulcer (HCC)    chronic  . Insomnia   . Lower urinary tract symptoms (LUTS)   . Osteoarthritis    knees  . Phimosis   . Rosacea   . Type 2 diabetes mellitus ([place])    last A1c 8.5 on 03-30-2016  . Ulcerative colitis followed by  dr Ulice Dash pyrtle   s/p colectomy ~ [facility] at [facility]  . Wears dentures    upper  . Wears glasses    Past Surgical History:  Procedure Laterality Date  . CARDIAC CATHETERIZATION  11/15/1998   dr berry   30% distal left main, 75% mid LAD, 50% OM and total RCA with left to right collateral and normal LV function.  [name] CIRCUMCISION N/A 09/25/2016   Procedure: CIRCUMCISION ADULT;  Surgeon: Alexis Frock, MD;  Location: [facility];  Service: Urology;  Laterality: N/A;  . COMPLETION PROCTECTOMY/ CONSTRUCTION ILEAL J POUCH/ DIVERTING LOOP ILEOSTOMY/ EXTENSIVE LYSIS ADHESIONS  11-07-2004   [facility]  . ESOPHAGOGASTRODUODENOSCOPY N/A 03/16/2014   Procedure: ESOPHAGOGASTRODUODENOSCOPY (EGD);  Surgeon: Jerene Bears, MD;  Location: [facility] ENDOSCOPY;  Service: Endoscopy;  Laterality: N/A;  . FLEXIBLE SIGMOIDOSCOPY N/A 03/16/2014   Procedure: FLEXIBLE SIGMOIDOSCOPY;  Surgeon: Jerene Bears, MD;  Location: [facility] ENDOSCOPY;  Service: Endoscopy;  Laterality: N/A;  . KNEE ARTHROSCOPY Bilateral 1990's  . NM MYOCAR PERF WALL MOTION  08/06/2010   dr berry   Low risk nuclear study w/ no ischemia (no significant change compared to prior study)/  normal LV function and wall motion , ef 60%  . SUBTOTAL COLECTOMY  W/ END ILEOSTOMY  05/ 2005   [facility]  . TAKE DOWN LOOP ILEOSTOMY  01-28-2005    [facility]  . TRANSTHORACIC ECHOCARDIOGRAM  11/16/2007   mild concentric LVH,  ef >55%,  grade 1 diastolic dysfuction/  trivial MR and TR/  borderline RVE/  focal nodular thickening of noncoronary cusp of trileaflet  AV/     Current Outpatient Medications on File Prior to Visit  Medication Sig Dispense Refill  . amLODipine (NORVASC) 10 MG tablet TAKE 1 TABLET BY MOUTH ONCE DAILY (Patient taking differently: TAKE 10 mg TABLET BY MOUTH ONCE DAILY) 90 tablet 1  . glipiZIDE (GLUCOTROL XL) 10 MG 24 hr tablet TAKE 1 TABLET BY MOUTH ONCE A DAY (Patient taking differently: TAKE 10 mg TABLET BY MOUTH ONCE A DAY) 30 tablet 1  . isosorbide mononitrate (IMDUR) 60 MG 24 hr tablet TAKE 1 TABLET BY MOUTH ONCE DAILY (Patient taking differently: TAKE 60 mg  TABLET BY MOUTH ONCE DAILY) 90 tablet 3  . lisinopril-hydrochlorothiazide (PRINZIDE,ZESTORETIC) 20-12.5 MG tablet Take 1 tablet by mouth daily. 90 tablet 2  . lovastatin (MEVACOR) 10 MG tablet Take 1 tablet (10 mg total) by mouth at bedtime. 30 tablet 7  . metFORMIN (GLUCOPHAGE) 1000 MG tablet TAKE 1 TABLET BY MOUTH TWICE A  DAY WITH FOOD. (Patient taking differently: TAKE 1000 mg  TABLET BY MOUTH TWICE A DAY WITH FOOD.) 60 tablet 1  . metoprolol succinate (TOPROL-XL) 50 MG 24 hr tablet TAKE 1 AND 1/2 TABLETS BY MOUTH DAILY. TAKE WITH OR IMMEDIATELY FOLLOWING A MEAL (Patient taking differently: TAKE 75 mg TABLETS BY MOUTH DAILY. TAKE WITH OR IMMEDIATELY FOLLOWING A MEAL) 45 tablet 11  . minocycline (MINOCIN,DYNACIN) 50 MG capsule Take 1 capsule by mouth every morning.     . pantoprazole (PROTONIX) 40 MG tablet TAKE 1 TABLET BY MOUTH ONCE DAILY (Patient taking differently: TAKE 40 mg  TABLET BY MOUTH ONCE DAILY) 30 tablet 1  . rivaroxaban (XARELTO) 20 MG TABS tablet Take 1 tablet (20 mg total) by mouth daily with supper. 30 tablet 0  . tiZANidine (ZANAFLEX) 4 MG tablet TAKE ONE TABLET BY MOUTH AT BEDTIME AS NEEDED (Patient taking differently: TAKE 4 mg TABLET BY MOUTH AT BEDTIME AS NEEDED) 30 tablet 2  . traMADol (ULTRAM) 50 MG tablet TAKE 2 TABLETS BY MOUTH 3 TIMES A DAY ASNEEDED FOR CHRONIC PAIN 180 tablet 0  . VICTOZA 18 MG/3ML SOPN INJECT 1.2MG UNDER THE SKIN ONCE A DAY 6 mL  3  . zolpidem (AMBIEN) 10 MG tablet TAKE 1 TABLET BY MOUTH EVERY NIGHT AT BEDTIME AS NEEDED FOR INSOMNIA (Patient taking differently: TAKE 10 mg TABLET BY MOUTH EVERY NIGHT AT BEDTIME AS NEEDED FOR INSOMNIA) 30 tablet 1   No current facility-administered medications on file prior to visit.    No Known Allergies Social History   Socioeconomic History  . Marital status: Married    Spouse name: Not on file  . Number of children: 2  . Years of education: Not on file  . Highest education level: Not on file  Occupational History  . Occupation: retired  Scientific laboratory technician  . Financial resource strain: Not on file  . Food insecurity:    Worry: Not on file    Inability: Not on file  . Transportation needs:    Medical: Not on file    Non-medical: Not on file  Tobacco Use  . Smoking status: Former Smoker    Packs/day: 2.00    Years: 9.00    Pack years: 18.00    Types: Cigarettes    Last attempt to quit: 12/28/1974    Years since quitting: 43.2  . Smokeless tobacco: Never Used  Substance and Sexual Activity  . Alcohol use: Yes    Alcohol/week: 0.0 oz    Comment: occ  . Drug use: No  . Sexual activity: Not on file  Lifestyle  . Physical activity:    Days per week: Not on file    Minutes per session: Not on file  . Stress: Not on file  Relationships  . Social connections:    Talks on phone: Not on file    Gets together: Not on file    Attends religious service: Not on file    Active member of club or organization: Not on file    Attends meetings of clubs or organizations: Not on file    Relationship status: Not on file  . Intimate partner violence:    Fear of current or ex partner: Not on file    Emotionally abused: Not on file    Physically abused: Not on file    Forced sexual activity: Not on file  Other Topics Concern  . Not on file  Social History Narrative   Married 1967   Retired from  autobody work   Lives in [place] with his wife.      Review of Systems  All  other systems reviewed and are negative.      Objective:   Physical Exam  Constitutional: He appears well-developed and well-nourished.  Cardiovascular: Normal rate, regular rhythm and normal heart sounds.  No murmur heard. Pulmonary/Chest: Effort normal and breath sounds normal. No respiratory distress. He has no wheezes. He has no rales.  Abdominal: Soft. Bowel sounds are normal. He exhibits no distension. There is no tenderness. There is no rebound and no guarding.  Musculoskeletal: He exhibits no edema.  Vitals reviewed.         Assessment & Plan:  Acute deep vein thrombosis (DVT) of right tibial vein (HCC) - Plan: CBC with Differential/Platelet, COMPLETE METABOLIC PANEL WITH GFR  Duodenal ulcer disease  ASCVD (arteriosclerotic cardiovascular disease)  Diabetes mellitus type II, non insulin dependent ([place])  Essential hypertension  Had a discussion with the patient today regarding his options.  This is his second DVT.  This was unprovoked.  He is currently seeing vascular surgery who is recommended 1 additional month of Xarelto and then repeat lower extremity venous Doppler.  Plan is to discontinue Xarelto and begin baby aspirin for prophylaxis long-term.  Today I discussed with the patient continuing Xarelto 10 mg at reduced dose long-term for prevention of thromboembolism.  I explained to the patient that this is not been studied in his situation.  He has a very unique situation.  There are risk and benefits to either option.  If the patient did elect to continue low-dose Xarelto indefinitely, we would need to monitor CBC closely.  I suggested having the patient swing by for hemoglobin monthly and then obviously if he had any melena or dark stools to be checked immediately.  After a long discussion weighing the risk and benefits, the patient elects to complete 1 additional month of Xarelto and then switch to baby aspirin as recommended by his vascular surgeon.  I will recheck a CBC  today to monitor for any evidence of an occult GI bleed to ensure stability of his hemoglobin.  We discussed his A1c which I believe is acceptable at 7.2.  I would like to check a fasting lipid panel and a urine microalbumin at his earliest convenience.  He can return fasting anytime for this.

## 2018-03-30 LAB — COMPLETE METABOLIC PANEL WITH GFR
AG Ratio: 1.3 (calc) (ref 1.0–2.5)
ALBUMIN MSPROF: 4.2 g/dL (ref 3.6–5.1)
ALKALINE PHOSPHATASE (APISO): 60 U/L (ref 40–115)
ALT: 24 U/L (ref 9–46)
AST: 29 U/L (ref 10–35)
BUN / CREAT RATIO: 12 (calc) (ref 6–22)
BUN: 15 mg/dL (ref 7–25)
CALCIUM: 9.6 mg/dL (ref 8.6–10.3)
CO2: 25 mmol/L (ref 20–32)
CREATININE: 1.28 mg/dL — AB (ref 0.70–1.18)
Chloride: 102 mmol/L (ref 98–110)
GFR, EST AFRICAN AMERICAN: 64 mL/min/{1.73_m2} (ref >=60)
GFR, EST NON AFRICAN AMERICAN: 55 mL/min/{1.73_m2} — AB (ref >=60)
Globulin: 3.2 g/dL (calc) (ref 1.9–3.7)
Glucose, Bld: 196 mg/dL — ABNORMAL HIGH (ref 65–99)
Potassium: 4.1 mmol/L (ref 3.5–5.3)
Sodium: 138 mmol/L (ref 135–146)
TOTAL PROTEIN: 7.4 g/dL (ref 6.1–8.1)
Total Bilirubin: 0.4 mg/dL (ref 0.2–1.2)

## 2018-03-30 LAB — CBC WITH DIFFERENTIAL/PLATELET
BASOS ABS: 27 {cells}/uL (ref 0–200)
BASOS PCT: 0.4 %
EOS PCT: 2.1 %
Eosinophils Absolute: 141 cells/uL (ref 15–500)
HEMATOCRIT: 41.6 % (ref 38.5–50.0)
HEMOGLOBIN: 13.5 g/dL (ref 13.2–17.1)
LYMPHS ABS: 1970 {cells}/uL (ref 850–3900)
MCH: 25.4 pg — ABNORMAL LOW (ref 27.0–33.0)
MCHC: 32.5 g/dL (ref 32.0–36.0)
MCV: 78.3 fL — ABNORMAL LOW (ref 80.0–100.0)
MONOS PCT: 7.6 %
NEUTROS ABS: 4054 {cells}/uL (ref 1500–7800)
Neutrophils Relative %: 60.5 %
Platelets: 174 10*3/uL (ref 140–400)
RBC: 5.31 10*6/uL (ref 4.20–5.80)
RDW: 17.3 % — ABNORMAL HIGH (ref 11.0–15.0)
Total Lymphocyte: 29.4 %
WBC mixed population: 509 cells/uL (ref 200–950)
WBC: 6.7 10*3/uL (ref 3.8–10.8)

## 2018-04-01 ENCOUNTER — Other Ambulatory Visit: Payer: Self-pay

## 2018-04-01 DIAGNOSIS — I82441 Acute embolism and thrombosis of right tibial vein: Secondary | ICD-10-CM

## 2018-04-01 DIAGNOSIS — I82401 Acute embolism and thrombosis of unspecified deep veins of right lower extremity: Secondary | ICD-10-CM

## 2018-04-11 ENCOUNTER — Other Ambulatory Visit: Payer: Self-pay | Admitting: Family Medicine

## 2018-04-18 ENCOUNTER — Other Ambulatory Visit: Payer: Self-pay | Admitting: Internal Medicine

## 2018-04-18 ENCOUNTER — Other Ambulatory Visit: Payer: Self-pay | Admitting: Family Medicine

## 2018-04-18 NOTE — Telephone Encounter (Signed)
Ok to refill??  Last office visit 03/29/2018.  Last refill on Ambien 11/16/2017, #1 refill.   Last refill on Tramadol 03/17/2018.

## 2018-04-25 ENCOUNTER — Encounter: Payer: Self-pay | Admitting: Surgery

## 2018-04-25 ENCOUNTER — Ambulatory Visit (HOSPITAL_COMMUNITY)
Admission: RE | Admit: 2018-04-25 | Discharge: 2018-04-25 | Disposition: A | Discharge status: Home / Self Care | Payer: PPO | Source: Ambulatory Visit | Attending: Surgery | Admitting: Surgery

## 2018-04-25 ENCOUNTER — Ambulatory Visit: Payer: PPO | Admitting: Surgery

## 2018-04-25 VITALS — BP 125/87 | HR 78 | Temp 97.8°F | Resp 18 | Ht 67.0 in | Wt 214.1 lb

## 2018-04-25 DIAGNOSIS — I82401 Acute embolism and thrombosis of unspecified deep veins of right lower extremity: Secondary | ICD-10-CM | POA: Diagnosis not present

## 2018-04-25 DIAGNOSIS — I82441 Acute embolism and thrombosis of right tibial vein: Secondary | ICD-10-CM | POA: Diagnosis not present

## 2018-04-25 NOTE — Progress Notes (Signed)
[facility]  Patient name: Manuel Grant MRN: [id number] DOB: [birth year] Sex: male   REASON FOR VISIT:    Follow up  HISOTRY OF PRESENT ILLNESS:    Manuel Grant is a 72 y.o. male who returns today for hospital follow-up.  He was admitted on 316 with pain and redness in his right leg.  He was diagnosed with a DVT within his posterior tibial and soleal veins.  There is also acute thrombus within the saphenous vein from the mid calf to the proximal thigh.  The patient has a history of DVT, however his anticoagulation was discontinued because of a GI bleed.  Workup for his GI bleed revealed an ulcer within his J-pouch which he had performed for ulcerative colitis.  The patient was initially managed on heparin and then we transitioned him to Xarelto.  He had an ultrasound in the hospital that did not show any progression.  He had another ultrasound in the office which did not show any progression.  He is now back having stopped his Xarelto 2 weeks ago.  He has no complaints at this time.  He has minimal swelling.  He does not wear compression stockings.  He denies any melena.   PAST MEDICAL HISTORY:   Past Medical History:  Diagnosis Date  . B12 deficiency   . Chronic deep vein thrombosis (DVT) ([place]) dx 2015  start on xarelto  LLE--  stopped in 03/ 2016 due to GI bleed   last doppler 02-09-2015 chronic left common femoral and popliteal dvt's (mild partial resolution since dx 2015)  . CKD (chronic kidney disease), stage II   . Coronary artery disease cardiologist-  dr berry   a. 10/1998 Cath: LM 30d, LAD 58m OM 50, RCA 100 w/ L->R collats, nl LV ->Med Rx;  b. 07/2010 MV: no ischemia.  . DDD (degenerative disc disease), cervical   . GERD (gastroesophageal reflux disease)   . Hiatal hernia   . History of adenomatous polyp of colon   . History of balanitis    05/ 2017  . History of duodenal ulcer    w/ GI bleed in  2015 and 2016  . History of GI bleed    secondary to upper duodenal ulcer's 2015 and 2016   . History of Helicobacter pylori infection    2015  . History of kidney stones   . Hyperlipidemia   . Hypertension   . Ileo-anal pouch ulcer (HCC)    chronic  . Insomnia   . Lower urinary tract symptoms (LUTS)   . Osteoarthritis    knees  . Phimosis   . Rosacea   . [place] 2 diabetes [place]    last A1c 8.5 on 03-30-2016  . Ulcerative colitis followed by  [name]   [facility] . Wears dentures    upper  . Wears glasses      FAMILY HISTORY:   Family History  Problem Relation Age of Onset  . Heart disease Mother   . Diabetes Mother   . Arthritis Mother   . Hypertension Mother   . COPD Father   . Emphysema Father   . Asthma Father   . Colon polyps Brother   . Colon cancer Neg Hx   . Prostate cancer Neg Hx     SOCIAL HISTORY:   Social History   Tobacco Use  . Smoking status: Former Smoker    Packs/day: 2.00  Years: 9.00    Pack years: 18.00    Types: Cigarettes    Last attempt to quit: 12/28/1974    Years since quitting: 43.3  . Smokeless tobacco: Never Used  Substance Use Topics  . Alcohol use: Yes    Alcohol/week: 0.0 oz    Comment: occ     ALLERGIES:   No Known Allergies   CURRENT MEDICATIONS:   Current Outpatient Medications  Medication Sig Dispense Refill  . amLODipine (NORVASC) 10 MG tablet TAKE 1 TABLET BY MOUTH ONCE DAILY (Patient taking differently: TAKE 10 mg TABLET BY MOUTH ONCE DAILY) 90 tablet 1  . glipiZIDE (GLUCOTROL XL) 10 MG 24 hr tablet TAKE 1 TABLET BY MOUTH ONCE A DAY 90 tablet 1  . isosorbide mononitrate (IMDUR) 60 MG 24 hr tablet TAKE 1 TABLET BY MOUTH ONCE DAILY (Patient taking differently: TAKE 60 mg  TABLET BY MOUTH ONCE DAILY) 90 tablet 3  . lisinopril-hydrochlorothiazide (PRINZIDE,ZESTORETIC) 20-12.5 MG tablet Take 1 tablet by mouth daily. 90 tablet 2  . lovastatin (MEVACOR) 10 MG tablet Take 1 tablet  (10 mg total) by mouth at bedtime. 30 tablet 7  . metFORMIN (GLUCOPHAGE) 1000 MG tablet TAKE 1 TABLET BY MOUTH TWICE A DAY WITH FOOD. (Patient taking differently: TAKE 1000 mg  TABLET BY MOUTH TWICE A DAY WITH FOOD.) 60 tablet 1  . metFORMIN (GLUCOPHAGE) 1000 MG tablet TAKE 1 TABLET BY MOUTH TWICE (2) DAILY WITH FOOD 180 tablet 1  . metoprolol succinate (TOPROL-XL) 50 MG 24 hr tablet TAKE 1 AND 1/2 TABLETS BY MOUTH DAILY. TAKE WITH OR IMMEDIATELY FOLLOWING A MEAL (Patient taking differently: TAKE 75 mg TABLETS BY MOUTH DAILY. TAKE WITH OR IMMEDIATELY FOLLOWING A MEAL) 45 tablet 11  . minocycline (MINOCIN,DYNACIN) 50 MG capsule Take 1 capsule by mouth every morning.     . pantoprazole (PROTONIX) 40 MG tablet TAKE 1 TABLET BY MOUTH ONCE DAILY (Patient taking differently: TAKE 40 mg  TABLET BY MOUTH ONCE DAILY) 30 tablet 1  . tiZANidine (ZANAFLEX) 4 MG tablet TAKE ONE TABLET BY MOUTH AT BEDTIME AS NEEDED (Patient taking differently: TAKE 4 mg TABLET BY MOUTH AT BEDTIME AS NEEDED) 30 tablet 2  . traMADol (ULTRAM) 50 MG tablet TAKE 2 TABLETS BY MOUTH 3 TIMES DAILY AS NEEDED FOR CHRONIC PAIN 180 tablet 1  . VICTOZA 18 MG/3ML SOPN INJECT 1.2MG UNDER THE SKIN ONCE A DAY 6 mL 3  . zolpidem (AMBIEN) 10 MG tablet TAKE 1 TABLET BY MOUTH EVERY NIGHT AT BEDTIME AS NEEDED FOR INSOMNIA 30 tablet 1  . rivaroxaban (XARELTO) 20 MG TABS tablet Take 1 tablet (20 mg total) by mouth daily with supper. 30 tablet 0   No current facility-administered medications for this visit.     REVIEW OF SYSTEMS:   [X]  denotes positive finding, [ ]  denotes negative finding Cardiac  Comments:  Chest pain or chest pressure:    Shortness of breath upon exertion:    Short of breath when lying flat:    Irregular heart rhythm:        Vascular    Pain in calf, thigh, or hip brought on by ambulation:    Pain in feet at night that wakes you up from your sleep:     Blood clot in your veins:    Leg swelling:         Pulmonary      Oxygen at home:    Productive cough:     Wheezing:  Neurologic    Sudden weakness in arms or legs:     Sudden numbness in arms or legs:     Sudden onset of difficulty speaking or slurred speech:    Temporary loss of vision in one eye:     Problems with dizziness:         Gastrointestinal    Blood in stool:     Vomited blood:         Genitourinary    Burning when urinating:     Blood in urine:        Psychiatric    Major depression:         Hematologic    Bleeding problems:    Problems with blood clotting too easily:        Skin    Rashes or ulcers:        Constitutional    Fever or chills:      PHYSICAL EXAM:   Vitals:   04/25/18 1007 04/25/18 1009  BP: 125/84 125/87  Pulse: 78   Resp: 18   Temp: 97.8 F (36.6 C)   TempSrc: Oral   SpO2: 98%   Weight: 214 lb 1.6 oz (97.1 kg)   Height: 5' 7"  (1.702 m)     GENERAL: The patient is a well-nourished male, in no acute distress. The vital signs are documented above. CARDIAC: There is a regular rate and rhythm.  PULMONARY: Non-labored respirations  MUSCULOSKELETAL: There are no major deformities or cyanosis. NEUROLOGIC: No focal weakness or paresthesias are detected. SKIN: There are no ulcers or rashes noted. PSYCHIATRIC: The patient has a normal affect.  STUDIES:   I have ordered and reviewed his duplex.  This shows a compressible right posterior tibial vein with no thrombus in the soleal veins.  There is improvement in the posterior tibial vein but no change in the great saphenous vein since the prior study  MEDICAL ISSUES:   DVT: The patient has completed 1 month of Xarelto which was what we had recommended giving his risk of GI bleeding.  There has been no progression of thrombus on multiple studies.  We discussed avoiding periods of prolonged inactivity.  He is going to contact me should he require any form of operative intervention where we would need to get a plan for his recovery to minimize his  risk for another DVT.  Otherwise, he will follow-up on an as-needed basis.    Annamarie Major, MD [facility] [phone] Pager [phone]

## 2018-05-06 ENCOUNTER — Other Ambulatory Visit: Payer: Self-pay | Admitting: Internal Medicine

## 2018-05-24 ENCOUNTER — Other Ambulatory Visit: Payer: Self-pay | Admitting: Family Medicine

## 2018-05-24 NOTE — Telephone Encounter (Signed)
Ok to refill??  Last office visit 03/29/2018.  Last refill on Zanaflex 02/17/2018, #2 refills.   Last refill on Ambien/ Tramadol 04/18/2018.

## 2018-05-24 NOTE — Telephone Encounter (Signed)
Requesting refill    Tramadol, Tizanidine, Ambien  LOV: 03/29/18  LRF:  02/17/18

## 2018-06-08 ENCOUNTER — Other Ambulatory Visit: Payer: Self-pay | Admitting: Internal Medicine

## 2018-06-08 ENCOUNTER — Other Ambulatory Visit: Payer: Self-pay | Admitting: Family Medicine

## 2018-06-09 ENCOUNTER — Telehealth: Payer: Self-pay | Admitting: Internal Medicine

## 2018-06-09 MED ORDER — PANTOPRAZOLE SODIUM 40 MG PO TBEC: 40.0000 mg | DELAYED_RELEASE_TABLET | Freq: Every day | ORAL | 1 refills | Status: DC

## 2018-06-09 NOTE — Telephone Encounter (Signed)
Patient states he needs medication pantoprazole refilled at [facility]. Pt scheduled for follow up 8.28.19.

## 2018-06-09 NOTE — Telephone Encounter (Signed)
Rx sent 

## 2018-06-23 ENCOUNTER — Other Ambulatory Visit: Payer: Self-pay | Admitting: Family Medicine

## 2018-06-23 NOTE — Telephone Encounter (Signed)
Requesting refill  Tramadol  LOV: 03/29/18  LRF: 05/24/18

## 2018-07-20 ENCOUNTER — Other Ambulatory Visit: Payer: Self-pay | Admitting: Family Medicine

## 2018-07-21 NOTE — Telephone Encounter (Signed)
Ok to refill??  Last office visit 03/29/2018.  Last refill 05/24/2018.

## 2018-08-12 ENCOUNTER — Other Ambulatory Visit: Payer: Self-pay | Admitting: Internal Medicine

## 2018-08-15 DIAGNOSIS — L812 Freckles: Secondary | ICD-10-CM | POA: Diagnosis not present

## 2018-08-15 DIAGNOSIS — D485 Neoplasm of uncertain behavior of skin: Secondary | ICD-10-CM | POA: Diagnosis not present

## 2018-08-15 DIAGNOSIS — D2230 Melanocytic nevi of unspecified part of face: Secondary | ICD-10-CM | POA: Diagnosis not present

## 2018-08-15 DIAGNOSIS — L570 Actinic keratosis: Secondary | ICD-10-CM | POA: Diagnosis not present

## 2018-08-15 DIAGNOSIS — D2260 Melanocytic nevi of unspecified upper limb, including shoulder: Secondary | ICD-10-CM | POA: Diagnosis not present

## 2018-08-15 DIAGNOSIS — Z1283 Encounter for screening for malignant neoplasm of skin: Secondary | ICD-10-CM | POA: Diagnosis not present

## 2018-08-15 DIAGNOSIS — D225 Melanocytic nevi of trunk: Secondary | ICD-10-CM | POA: Diagnosis not present

## 2018-08-15 DIAGNOSIS — L821 Other seborrheic keratosis: Secondary | ICD-10-CM | POA: Diagnosis not present

## 2018-08-15 DIAGNOSIS — L820 Inflamed seborrheic keratosis: Secondary | ICD-10-CM | POA: Diagnosis not present

## 2018-08-15 DIAGNOSIS — D1800 Hemangioma unspecified site: Secondary | ICD-10-CM | POA: Diagnosis not present

## 2018-08-22 ENCOUNTER — Other Ambulatory Visit: Payer: Self-pay | Admitting: Family Medicine

## 2018-08-22 NOTE — Telephone Encounter (Signed)
Ok to refill??  Last office visit 03/29/2018.  Last refill on Ambien 07/21/2018.  Last refill on Zanaflex 05/24/2018.

## 2018-08-24 ENCOUNTER — Other Ambulatory Visit: Payer: Self-pay | Admitting: Physician Assistant

## 2018-08-24 ENCOUNTER — Other Ambulatory Visit (INDEPENDENT_AMBULATORY_CARE_PROVIDER_SITE_OTHER): Payer: PPO

## 2018-08-24 ENCOUNTER — Ambulatory Visit: Payer: PPO | Admitting: Internal Medicine

## 2018-08-24 ENCOUNTER — Encounter: Payer: Self-pay | Admitting: Internal Medicine

## 2018-08-24 VITALS — BP 168/106 | HR 80 | Ht 67.0 in | Wt 219.0 lb

## 2018-08-24 DIAGNOSIS — D509 Iron deficiency anemia, unspecified: Secondary | ICD-10-CM

## 2018-08-24 DIAGNOSIS — K219 Gastro-esophageal reflux disease without esophagitis: Secondary | ICD-10-CM

## 2018-08-24 DIAGNOSIS — K91858 Other complications of intestinal pouch: Secondary | ICD-10-CM

## 2018-08-24 DIAGNOSIS — D500 Iron deficiency anemia secondary to blood loss (chronic): Secondary | ICD-10-CM | POA: Diagnosis not present

## 2018-08-24 LAB — IBC PANEL
Iron: 32 ug/dL — ABNORMAL LOW (ref 42–165)
SATURATION RATIOS: 6.2 % — AB (ref 20.0–50.0)
Transferrin: 370 mg/dL — ABNORMAL HIGH (ref 212.0–360.0)

## 2018-08-24 LAB — CBC WITH DIFFERENTIAL/PLATELET
BASOS PCT: 0.8 % (ref 0.0–3.0)
Basophils Absolute: 0 10*3/uL (ref 0.0–0.1)
EOS PCT: 1.9 % (ref 0.0–5.0)
Eosinophils Absolute: 0.1 10*3/uL (ref 0.0–0.7)
HEMATOCRIT: 35.8 % — AB (ref 39.0–52.0)
HEMOGLOBIN: 11.3 g/dL — AB (ref 13.0–17.0)
LYMPHS PCT: 29.6 % (ref 12.0–46.0)
Lymphs Abs: 1.5 10*3/uL (ref 0.7–4.0)
MCHC: 31.6 g/dL (ref 30.0–36.0)
MCV: 72 fl — ABNORMAL LOW (ref 78.0–100.0)
Monocytes Absolute: 0.5 10*3/uL (ref 0.1–1.0)
Monocytes Relative: 10.2 % (ref 3.0–12.0)
Neutro Abs: 2.9 10*3/uL (ref 1.4–7.7)
Neutrophils Relative %: 57.5 % (ref 43.0–77.0)
Platelets: 181 10*3/uL (ref 150.0–400.0)
RBC: 4.97 Mil/uL (ref 4.22–5.81)
RDW: 19.8 % — AB (ref 11.5–15.5)
WBC: 5.1 10*3/uL (ref 4.0–10.5)

## 2018-08-24 LAB — FERRITIN: Ferritin: 19.9 ng/mL — ABNORMAL LOW (ref 22.0–322.0)

## 2018-08-24 MED ORDER — PANTOPRAZOLE SODIUM 40 MG PO TBEC: 40.0000 mg | DELAYED_RELEASE_TABLET | Freq: Every day | ORAL | 3 refills | Status: DC

## 2018-08-24 NOTE — Patient Instructions (Signed)
We have sent the following medications to your pharmacy for you to pick up at your convenience: Pantoprazole 40 mg daily  Your provider has requested that you go to the basement level for lab work before leaving today. Press "B" on the elevator. The lab is located at the first door on the left as you exit the elevator.  Follow up with Dr Hilarie Fredrickson in 1 year.  If you are age 73 or older, your body mass index should be between 23-30. Your Body mass index is 34.3 kg/m. If this is out of the aforementioned range listed, please consider follow up with your Primary Care Provider.  If you are age 77 or younger, your body mass index should be between 19-25. Your Body mass index is 34.3 kg/m. If this is out of the aformentioned range listed, please consider follow up with your Primary Care Provider.

## 2018-08-24 NOTE — Telephone Encounter (Signed)
Last OV 03/29/2018 Last refill 06/23/2018 Ok to refill?

## 2018-08-24 NOTE — Progress Notes (Signed)
   Subjective:    Patient ID: Manuel Grant, male    DOB: 1946-10-29, 72 y.o.   MRN: [id number]  HPI Manuel Grant is a 72 year old male with a history of ulcerative colitis status post total colectomy with J-pouch creation in 2005, chronic anastomotic ulcer of J-pouch, history of iron deficiency anemia, GERD, history of ulcer disease and remote DVT here for follow-up.  He is here with his wife today.  He reports that he has been doing and feeling very well.  He has about 10-12 bowel movements per day which is his usual.  He seen no blood in his stool or melena.  He tries to eat slowly, avoid very high fiber roughage/cruciferous vegetables and remain hydrated by drinking fluids throughout the day.  He reports normal energy levels.  No abdominal pain.  GERD is well controlled without breakthrough heartburn, dysphagia or odynophagia.  He did see Dr. Drue Flirt last year to discuss chronic J-pouch ulceration.  They discussed re-creation of a new pouch but patient deferred as he wished to have no further surgery unless absolutely necessary.  He has been off of oral iron  Review of Systems As per HPI, otherwise negative  Current Medications, Allergies, Past Medical History, Past Surgical History, Family History and Social History were reviewed in [employer] record.     Objective:   Physical Exam BP (!) 168/106   Pulse 80   Ht 5' 7"  (1.702 m)   Wt 219 lb (99.3 kg)   BMI 34.30 kg/m  Constitutional: Well-developed and well-nourished. No distress. HEENT: Normocephalic and atraumatic. Oropharynx is clear and moist. Conjunctivae are normal.  No scleral icterus. Neck: Neck supple. Trachea midline. Cardiovascular: Normal rate, regular rhythm and intact distal pulses.  Pulmonary/chest: Effort normal and breath sounds normal. No wheezing, rales or rhonchi. Abdominal: Soft, nontender, nondistended. Bowel sounds active throughout.  Multiple ventral hernias which are  nontender. Extremities: no clubbing, cyanosis, or edema Neurological: Alert and oriented to person place and time. Skin: Skin is warm and dry. Psychiatric: Normal mood and affect. Behavior is normal.      Assessment & Plan:  72 year old male with a history of ulcerative colitis status post total colectomy with J-pouch creation in 2005, chronic anastomotic ulcer of J-pouch, history of iron deficiency anemia, GERD, history of ulcer disease and remote DVT here for follow-up.   1.  History of anastomotic ulceration at J-pouch after total colectomy for UC/IDA --he is doing well and has had no further overt bleeding.  I recommended we repeat CBC and iron studies today.  In the past he became iron deficient and symptomatic from anastomotic ulcer bleeding.   2.  GERD --well-controlled with pantoprazole.  Continue pantoprazole 40 mg once daily.  90-day supply  Annual follow-up, sooner if needed 15 minutes spent with the patient today. Greater than 50% was spent in counseling and coordination of care with the patient

## 2018-08-25 ENCOUNTER — Other Ambulatory Visit: Payer: Self-pay

## 2018-08-25 DIAGNOSIS — D509 Iron deficiency anemia, unspecified: Secondary | ICD-10-CM

## 2018-09-26 ENCOUNTER — Encounter: Payer: Self-pay | Admitting: Family Medicine

## 2018-09-26 ENCOUNTER — Ambulatory Visit (INDEPENDENT_AMBULATORY_CARE_PROVIDER_SITE_OTHER): Payer: PPO | Admitting: Family Medicine

## 2018-09-26 ENCOUNTER — Other Ambulatory Visit: Payer: Self-pay | Admitting: Family Medicine

## 2018-09-26 VITALS — BP 142/110 | HR 92 | Temp 98.6°F | Resp 18 | Ht 66.75 in | Wt 217.0 lb

## 2018-09-26 DIAGNOSIS — I10 Essential (primary) hypertension: Secondary | ICD-10-CM

## 2018-09-26 DIAGNOSIS — R3912 Poor urinary stream: Secondary | ICD-10-CM | POA: Diagnosis not present

## 2018-09-26 DIAGNOSIS — I2510 Atherosclerotic heart disease of native coronary artery without angina pectoris: Secondary | ICD-10-CM

## 2018-09-26 DIAGNOSIS — E7800 Pure hypercholesterolemia, unspecified: Secondary | ICD-10-CM | POA: Diagnosis not present

## 2018-09-26 DIAGNOSIS — Z23 Encounter for immunization: Secondary | ICD-10-CM | POA: Diagnosis not present

## 2018-09-26 DIAGNOSIS — E119 Type 2 diabetes mellitus without complications: Secondary | ICD-10-CM

## 2018-09-26 NOTE — Progress Notes (Signed)
Subjective:    Patient ID: Manuel Grant, male    DOB: 18-Aug-1946, 72 y.o.   MRN: [id number]  HPI Patient was recently admitted to the hospital with DVT in right posterior tibial vein, soleal vein and saphenous vein from calf to mid thigh.  I have copied relevant portions of the discharge summary below for my reference:   Brief H and P: For complete details please refer to admission H and P, but in briefPatient is a 72 year old malewith medical history significant of CAD, HTN, HLD, obesity, CKD stage II, DM type II, ulcerative colitis status post colectomy chronic diastolic CHF, presented with pain and redness of right lower extremity. Patient has a history of prior DVT, had GI bleeding with anticoagulation, hence not on anticoagulation. Denies any long trips, no history of malignancy. Patient had left lower extremity DVT a year ago and was started on Xarelto, developed bleeding duodenal ulcer. Has a history of chronic ulcerative colitis status post colectomy, colonoscopy in 12/2016 showed chronic nonhealing poulch ulceration, which has caused intermittent bleeding.    Hospital Course:  RLE DVT (deep venous thrombosis) (HCC) -Doppler ultrasound of the right lower extremity showed DVT in the right PTV, soleal veins, superficial thrombosis in the right GSV, from calf of the groin but not in the saphenofemoral junction. - Patient was placed on heparin drip, has tolerated it for over 48 hours without any incidents of bleeding - Vascular surgery was consulted, patient seen by Dr. Trula Slade, recommended repeat ultrasound of the right lower extremity. Repeat US showed no propagation noted after eating either DVT or SVT since the previous study. - Discussed in detail with Dr. Trula Slade, recommended xarelto lower dose 20 mg daily with supper for 1 month. Patient's gastroenterologist, Dr Hilarie Fredrickson is currently unavailable, patient had previously tolerated xarelto for 9 months. - Dr. Trula Slade will  follow the patient in 2 weeks in office, if patient has any further bleeding episodes, he will be taken off xarelto at that point and placed on aspirin.  Diabetes mellitus type 2 with neurological manifestations ([place]) -CBGs not well controlled, hemoglobin A1c was 7.0on8/16/2018 -Continue glipizide, metformin and Victoza  Essential hypertension, benign -BP stable, continue Norvasc, lisinopril, metoprolol  Pure hypercholesterolemia -Continue pravastatin  Ulcerative colitis ([place]) - Currently no bleeding or acute issues  CAD (coronary artery disease) -Currently no chest pain or shortness of breath, continue beta-blocker, Imdur  Here for follow up.  Patient is doing well on Xarelto 20 mg a day.  He is tolerating the medication without side effects.  He denies any melena or hematochezia.  He denies any abdominal pain.  He denies any hematemesis.  He denies any chest pain or pleurisy.  He is here today for hospital follow-up.  I also reviewed his lab work from the hospital.  Hemoglobin A1c obtained in the hospital was 7.2 which given age and medical comorbidities I believe is adequate.  He has not had a CBC since taking Xarelto.  At that time, my plan was: Had a discussion with the patient today regarding his options.  This is his second DVT.  This was unprovoked.  He is currently seeing vascular surgery who is recommended 1 additional month of Xarelto and then repeat lower extremity venous Doppler.  Plan is to discontinue Xarelto and begin baby aspirin for prophylaxis long-term.  Today I discussed with the patient continuing Xarelto 10 mg at reduced dose long-term for prevention of thromboembolism.  I explained to the patient that this is not been studied  in his situation.  He has a very unique situation.  There are risk and benefits to either option.  If the patient did elect to continue low-dose Xarelto indefinitely, we would need to monitor CBC closely.  I suggested having the  patient swing by for hemoglobin monthly and then obviously if he had any melena or dark stools to be checked immediately.  After a long discussion weighing the risk and benefits, the patient elects to complete 1 additional month of Xarelto and then switch to baby aspirin as recommended by his vascular surgeon.  I will recheck a CBC today to monitor for any evidence of an occult GI bleed to ensure stability of his hemoglobin.  We discussed his A1c which I believe is acceptable at 7.2.  I would like to check a fasting lipid panel and a urine microalbumin at his earliest convenience.  He can return fasting anytime for this.  09/26/18 Patient is here today for follow-up.  He is overdue to recheck his diabetes mellitus.  I asked the patient has sugars are running, and is clearly apparent that he is not checking his sugars often at all.  He denies any hypoglycemia.  He states that his sugars are usually under 150.  He is checking in the morning when he checks.  He denies any polyuria, polydipsia, or blurry vision.  His blood pressure is elevated today at 142/110.  He is not checking his blood pressure at home.  When I asked him what his blood pressures running at home, he states that it is good at home.  Apparently he checks episodically and usually is in the 120s to 130s over 90.  He denies any chest pain shortness of breath or dyspnea on exertion.  He denies any myalgias or right upper quadrant pain.  He is overdue to recheck a CBC to monitor his anemia.  He is due for his flu shot.  His biggest concern today is increasing issues with urination.  He states that he has a weak urinary stream.  He reports 3-4 episodes of nocturia every evening.  He states that when he uses the bathroom, his urine drips and dribbles.  He never feels like he is able to completely empty his bladder.  He denies any dysuria or hematuria.  He does report urgency and frequency however.  Digital rectal exam was performed today and shows no  significant BPH by exam although his symptoms are consistent with this. Past Medical History:  Diagnosis Date  . B12 deficiency   . Chronic deep vein thrombosis (DVT) ([place]) dx 2015  start on xarelto  LLE--  stopped in 03/ 2016 due to GI bleed   last doppler 02-09-2015 chronic left common femoral and popliteal dvt's (mild partial resolution since dx 2015)  . CKD (chronic kidney disease), stage II   . Coronary artery disease cardiologist-  dr berry   a. 10/1998 Cath: LM 30d, LAD 27m OM 50, RCA 100 w/ L->R collats, nl LV ->Med Rx;  b. 07/2010 MV: no ischemia.  . DDD (degenerative disc disease), cervical   . GERD (gastroesophageal reflux disease)   . Hiatal hernia   . History of adenomatous polyp of colon   . History of balanitis    05/ 2017  . History of duodenal ulcer    w/ GI bleed in 2015 and 2016  . History of GI bleed    secondary to upper duodenal ulcer's 2015 and 2016   . History of Helicobacter pylori infection  2015  . History of kidney stones   . Hyperlipidemia   . Hypertension   . Ileo-anal pouch ulcer (HCC)    chronic  . Insomnia   . Lower urinary tract symptoms (LUTS)   . Osteoarthritis    knees  . Phimosis   . Rosacea   . Type 2 diabetes mellitus ([place])    last A1c 8.5 on 03-30-2016  . Ulcerative colitis followed by  dr Ulice Dash pyrtle   s/p colectomy ~ [facility] at [facility]  . Wears dentures    upper  . Wears glasses    Past Surgical History:  Procedure Laterality Date  . CARDIAC CATHETERIZATION  11/15/1998   dr berry   30% distal left main, 75% mid LAD, 50% OM and total RCA with left to right collateral and normal LV function.  [name] CIRCUMCISION N/A 09/25/2016   Procedure: CIRCUMCISION ADULT;  Surgeon: Alexis Frock, MD;  Location: [facility];  Service: Urology;  Laterality: N/A;  . COMPLETION PROCTECTOMY/ CONSTRUCTION ILEAL J POUCH/ DIVERTING LOOP ILEOSTOMY/ EXTENSIVE LYSIS ADHESIONS  11-07-2004   [facility]  . ESOPHAGOGASTRODUODENOSCOPY N/A 03/16/2014    Procedure: ESOPHAGOGASTRODUODENOSCOPY (EGD);  Surgeon: Jerene Bears, MD;  Location: [facility] ENDOSCOPY;  Service: Endoscopy;  Laterality: N/A;  . FLEXIBLE SIGMOIDOSCOPY N/A 03/16/2014   Procedure: FLEXIBLE SIGMOIDOSCOPY;  Surgeon: Jerene Bears, MD;  Location: [facility] ENDOSCOPY;  Service: Endoscopy;  Laterality: N/A;  . KNEE ARTHROSCOPY Bilateral 1990's  . NM MYOCAR PERF WALL MOTION  08/06/2010   dr berry   Low risk nuclear study w/ no ischemia (no significant change compared to prior study)/  normal LV function and wall motion , ef 60%  . SUBTOTAL COLECTOMY  W/ END ILEOSTOMY  05/ 2005   [facility]  . TAKE DOWN LOOP ILEOSTOMY  01-28-2005    [facility]  . TRANSTHORACIC ECHOCARDIOGRAM  11/16/2007   mild concentric LVH,  ef >55%,  grade 1 diastolic dysfuction/  trivial MR and TR/  borderline RVE/  focal nodular thickening of noncoronary cusp of trileaflet AV/     Current Outpatient Medications on File Prior to Visit  Medication Sig Dispense Refill  . amLODipine (NORVASC) 10 MG tablet TAKE 1 TABLET BY MOUTH ONCE DAILY (Patient taking differently: Take 10 mg by mouth daily. ) 90 tablet 1  . glipiZIDE (GLUCOTROL XL) 10 MG 24 hr tablet TAKE 1 TABLET BY MOUTH ONCE A DAY 90 tablet 1  . isosorbide mononitrate (IMDUR) 60 MG 24 hr tablet TAKE 1 TABLET BY MOUTH ONCE DAILY (Patient taking differently: TAKE 60 mg  TABLET BY MOUTH ONCE DAILY) 90 tablet 3  . lisinopril-hydrochlorothiazide (PRINZIDE,ZESTORETIC) 20-12.5 MG tablet Take 1 tablet by mouth daily. 90 tablet 2  . lovastatin (MEVACOR) 10 MG tablet Take 1 tablet (10 mg total) by mouth at bedtime. 30 tablet 7  . metFORMIN (GLUCOPHAGE) 1000 MG tablet TAKE 1 TABLET BY MOUTH TWICE A DAY WITH FOOD. (Patient taking differently: TAKE 1000 mg  TABLET BY MOUTH TWICE A DAY WITH FOOD.) 60 tablet 1  . metoprolol succinate (TOPROL-XL) 50 MG 24 hr tablet TAKE 1 AND 1/2 TABLETS BY MOUTH DAILY. TAKE WITH OR IMMEDIATELY FOLLOWING A MEAL (Patient taking differently: TAKE 75 mg TABLETS BY MOUTH DAILY.  TAKE WITH OR IMMEDIATELY FOLLOWING A MEAL) 45 tablet 11  . minocycline (MINOCIN,DYNACIN) 50 MG capsule Take 1 capsule by mouth every morning.     . pantoprazole (PROTONIX) 40 MG tablet Take 1 tablet (40 mg total) by mouth daily. 90 tablet 3  .  tiZANidine (ZANAFLEX) 4 MG tablet TAKE 1 TABLET BY MOUTH AT BEDTIME AS NEEDED 30 tablet 2  . VICTOZA 18 MG/3ML SOPN INJECT 1.2MG UNDER THE SKIN ONCE A DAY 6 mL 3  . zolpidem (AMBIEN) 10 MG tablet TAKE 1 TABLET BY MOUTH AT BEDTIME AS NEEDED FOR INSOMNIA 30 tablet 3   No current facility-administered medications on file prior to visit.    No Known Allergies Social History   Socioeconomic History  . Marital status: Married    Spouse name: Not on file  . Number of children: 2  . Years of education: Not on file  . Highest education level: Not on file  Occupational History  . Occupation: retired  Scientific laboratory technician  . Financial resource strain: Not on file  . Food insecurity:    Worry: Not on file    Inability: Not on file  . Transportation needs:    Medical: Not on file    Non-medical: Not on file  Tobacco Use  . Smoking status: Former Smoker    Packs/day: 2.00    Years: 9.00    Pack years: 18.00    Types: Cigarettes    Last attempt to quit: 12/28/1974    Years since quitting: 43.7  . Smokeless tobacco: Never Used  Substance and Sexual Activity  . Alcohol use: Yes    Alcohol/week: 0.0 standard drinks    Comment: occ  . Drug use: No  . Sexual activity: Not on file  Lifestyle  . Physical activity:    Days per week: Not on file    Minutes per session: Not on file  . Stress: Not on file  Relationships  . Social connections:    Talks on phone: Not on file    Gets together: Not on file    Attends religious service: Not on file    Active member of club or organization: Not on file    Attends meetings of clubs or organizations: Not on file    Relationship status: Not on file  . Intimate partner violence:    Fear of current or ex partner: Not  on file    Emotionally abused: Not on file    Physically abused: Not on file    Forced sexual activity: Not on file  Other Topics Concern  . Not on file  Social History Narrative   Married 1967   Retired from [employer] work   Lives in [place] with his wife.      Review of Systems  All other systems reviewed and are negative.      Objective:   Physical Exam  Constitutional: He appears well-developed and well-nourished.  Cardiovascular: Normal rate, regular rhythm and normal heart sounds.  No murmur heard. Pulmonary/Chest: Effort normal and breath sounds normal. No respiratory distress. He has no wheezes. He has no rales.  Abdominal: Soft. Bowel sounds are normal. He exhibits no distension. There is no tenderness. There is no rebound and no guarding.  Genitourinary: Rectum normal and prostate normal.  Musculoskeletal: He exhibits no edema.  Vitals reviewed.         Assessment & Plan:  ASCVD (arteriosclerotic cardiovascular disease)  Diabetes mellitus type II, non insulin dependent ([place]) - Plan: CBC with Differential/Platelet, COMPLETE METABOLIC PANEL WITH GFR, Lipid panel, Microalbumin, urine, Hemoglobin A1c  Pure hypercholesterolemia  Essential hypertension  Weak urinary stream - Plan: PSA  Need for prophylactic vaccination and inoculation against influenza - Plan: Flu vaccine HIGH DOSE PF Patient received his flu shot today.  Patient's rectal exam shows mild prostatic enlargement.  I will check a PSA as he is overdue for prostate cancer screening.  Based on the results, I will likely start the patient on Flomax 0.4 mg p.o. nightly to see if his symptoms will improve as his symptoms are consistent with LUTS due to BPH.  His blood pressure today is not well controlled however the patient is resistant to start any medication.  I have asked him to check his blood pressure morning and afternoon for 1 week and report the values to me so that we can titrate his medication  if greater than 140/90.  I will check a fasting lipid panel.  Goal LDL cholesterol is less than 70.  I will check a hemoglobin A1c.  Goal hemoglobin A1c is less than 7.  Patient is currently on aspirin for secondary prevention of cardiovascular disease.  He is no longer taking Xarelto

## 2018-09-26 NOTE — Telephone Encounter (Signed)
Requesting refill    Tramadol  LOV: 03/29/18  LRF:  08/25/18

## 2018-09-27 LAB — LIPID PANEL
Cholesterol: 132 mg/dL (ref <200)
HDL: 39 mg/dL — ABNORMAL LOW (ref >40)
LDL Cholesterol (Calc): 67 mg/dL (calc)
Non-HDL Cholesterol (Calc): 93 mg/dL (calc) (ref <130)
Total CHOL/HDL Ratio: 3.4 (calc) (ref <5.0)
Triglycerides: 193 mg/dL — ABNORMAL HIGH (ref <150)

## 2018-09-27 LAB — COMPLETE METABOLIC PANEL WITH GFR
AG Ratio: 1.3 (calc) (ref 1.0–2.5)
ALT: 29 U/L (ref 9–46)
AST: 33 U/L (ref 10–35)
Albumin: 4.1 g/dL (ref 3.6–5.1)
Alkaline phosphatase (APISO): 51 U/L (ref 40–115)
BUN/Creatinine Ratio: 12 (calc) (ref 6–22)
BUN: 15 mg/dL (ref 7–25)
CO2: 27 mmol/L (ref 20–32)
Calcium: 9.5 mg/dL (ref 8.6–10.3)
Chloride: 103 mmol/L (ref 98–110)
Creat: 1.28 mg/dL — ABNORMAL HIGH (ref 0.70–1.18)
GFR, Est African American: 64 mL/min/{1.73_m2} (ref >=60)
GFR, Est Non African American: 55 mL/min/{1.73_m2} — ABNORMAL LOW (ref >=60)
Globulin: 3.2 g/dL (calc) (ref 1.9–3.7)
Glucose, Bld: 75 mg/dL (ref 65–99)
Potassium: 4.2 mmol/L (ref 3.5–5.3)
Sodium: 139 mmol/L (ref 135–146)
Total Bilirubin: 0.4 mg/dL (ref 0.2–1.2)
Total Protein: 7.3 g/dL (ref 6.1–8.1)

## 2018-09-27 LAB — CBC WITH DIFFERENTIAL/PLATELET
BASOS ABS: 39 {cells}/uL (ref 0–200)
Basophils Relative: 0.7 %
EOS ABS: 83 {cells}/uL (ref 15–500)
EOS PCT: 1.5 %
HCT: 38.6 % (ref 38.5–50.0)
Hemoglobin: 12.1 g/dL — ABNORMAL LOW (ref 13.2–17.1)
Lymphs Abs: 2013 cells/uL (ref 850–3900)
MCH: 23.7 pg — AB (ref 27.0–33.0)
MCHC: 31.3 g/dL — AB (ref 32.0–36.0)
MCV: 75.5 fL — ABNORMAL LOW (ref 80.0–100.0)
MONOS PCT: 10 %
NEUTROS PCT: 51.2 %
Neutro Abs: 2816 cells/uL (ref 1500–7800)
Platelets: 190 10*3/uL (ref 140–400)
RBC: 5.11 10*6/uL (ref 4.20–5.80)
RDW: 21.3 % — AB (ref 11.0–15.0)
Total Lymphocyte: 36.6 %
WBC mixed population: 550 cells/uL (ref 200–950)
WBC: 5.5 10*3/uL (ref 3.8–10.8)

## 2018-09-27 LAB — HEMOGLOBIN A1C
EAG (MMOL/L): 8.9 (calc)
Hgb A1c MFr Bld: 7.2 % of total Hgb — ABNORMAL HIGH (ref <5.7)
MEAN PLASMA GLUCOSE: 160 (calc)

## 2018-09-27 LAB — MICROALBUMIN, URINE: Microalb, Ur: 20.1 mg/dL

## 2018-09-27 LAB — PSA: PSA: 0.4 ng/mL (ref <=4.0)

## 2018-10-12 ENCOUNTER — Encounter: Payer: Self-pay | Admitting: Family Medicine

## 2018-10-20 ENCOUNTER — Other Ambulatory Visit: Payer: Self-pay | Admitting: Family Medicine

## 2018-10-27 ENCOUNTER — Other Ambulatory Visit: Payer: Self-pay | Admitting: Family Medicine

## 2018-11-14 ENCOUNTER — Other Ambulatory Visit: Payer: Self-pay | Admitting: Family Medicine

## 2018-11-21 ENCOUNTER — Telehealth: Payer: Self-pay

## 2018-11-25 ENCOUNTER — Other Ambulatory Visit: Payer: Self-pay | Admitting: Family Medicine

## 2018-11-28 ENCOUNTER — Other Ambulatory Visit: Payer: Self-pay | Admitting: Family Medicine

## 2018-11-28 NOTE — Telephone Encounter (Signed)
Requesting refill    Tramadol  LOV:  09/26/18  LRF:  10/28/18

## 2018-11-28 NOTE — Telephone Encounter (Signed)
Requesting refill    Tizanidine  LOV: 09/26/18  LRF:  08/22/18

## 2018-12-09 ENCOUNTER — Other Ambulatory Visit (INDEPENDENT_AMBULATORY_CARE_PROVIDER_SITE_OTHER): Payer: PPO

## 2018-12-09 DIAGNOSIS — D509 Iron deficiency anemia, unspecified: Secondary | ICD-10-CM | POA: Diagnosis not present

## 2018-12-09 LAB — CBC WITH DIFFERENTIAL/PLATELET
BASOS ABS: 0.1 10*3/uL (ref 0.0–0.1)
Basophils Relative: 0.8 % (ref 0.0–3.0)
Eosinophils Absolute: 0.1 10*3/uL (ref 0.0–0.7)
Eosinophils Relative: 2.3 % (ref 0.0–5.0)
HCT: 42.4 % (ref 39.0–52.0)
Hemoglobin: 13.7 g/dL (ref 13.0–17.0)
LYMPHS PCT: 24.9 % (ref 12.0–46.0)
Lymphs Abs: 1.6 10*3/uL (ref 0.7–4.0)
MCHC: 32.3 g/dL (ref 30.0–36.0)
MCV: 83.5 fl (ref 78.0–100.0)
Monocytes Absolute: 0.7 10*3/uL (ref 0.1–1.0)
Monocytes Relative: 11 % (ref 3.0–12.0)
NEUTROS ABS: 3.9 10*3/uL (ref 1.4–7.7)
Neutrophils Relative %: 61 % (ref 43.0–77.0)
Platelets: 136 10*3/uL — ABNORMAL LOW (ref 150.0–400.0)
RBC: 5.07 Mil/uL (ref 4.22–5.81)
RDW: 20 % — ABNORMAL HIGH (ref 11.5–15.5)
WBC: 6.4 10*3/uL (ref 4.0–10.5)

## 2018-12-09 LAB — FERRITIN: Ferritin: 29.9 ng/mL (ref 22.0–322.0)

## 2018-12-09 LAB — IBC PANEL
Iron: 171 ug/dL — ABNORMAL HIGH (ref 42–165)
Saturation Ratios: 41.1 % (ref 20.0–50.0)
Transferrin: 297 mg/dL (ref 212.0–360.0)

## 2018-12-14 ENCOUNTER — Other Ambulatory Visit: Payer: Self-pay

## 2018-12-14 DIAGNOSIS — D509 Iron deficiency anemia, unspecified: Secondary | ICD-10-CM

## 2018-12-15 ENCOUNTER — Other Ambulatory Visit: Payer: Self-pay | Admitting: Cardiovascular Disease

## 2018-12-26 ENCOUNTER — Other Ambulatory Visit: Payer: Self-pay | Admitting: Family Medicine

## 2018-12-27 ENCOUNTER — Other Ambulatory Visit: Payer: Self-pay | Admitting: Family Medicine

## 2018-12-27 NOTE — Telephone Encounter (Signed)
Ok to refill??  Last office visit 09/26/2018.  Last refill 08/22/2018, #3 refills.

## 2018-12-27 NOTE — Telephone Encounter (Signed)
Ok to refill??  Last office visit 09/26/2018.  Last refill 11/29/2018.

## 2019-01-03 ENCOUNTER — Ambulatory Visit (INDEPENDENT_AMBULATORY_CARE_PROVIDER_SITE_OTHER): Payer: PPO | Admitting: Family Medicine

## 2019-01-03 ENCOUNTER — Other Ambulatory Visit: Payer: Self-pay | Admitting: Family Medicine

## 2019-01-03 ENCOUNTER — Encounter: Payer: Self-pay | Admitting: Family Medicine

## 2019-01-03 VITALS — BP 162/120 | HR 86 | Temp 98.2°F | Resp 14 | Ht 66.75 in | Wt 218.0 lb

## 2019-01-03 DIAGNOSIS — R350 Frequency of micturition: Secondary | ICD-10-CM | POA: Diagnosis not present

## 2019-01-03 DIAGNOSIS — R3912 Poor urinary stream: Secondary | ICD-10-CM | POA: Diagnosis not present

## 2019-01-03 DIAGNOSIS — N401 Enlarged prostate with lower urinary tract symptoms: Secondary | ICD-10-CM

## 2019-01-03 LAB — URINALYSIS, ROUTINE W REFLEX MICROSCOPIC
Bacteria, UA: NONE SEEN /HPF
Bilirubin Urine: NEGATIVE
Glucose, UA: NEGATIVE
Hgb urine dipstick: NEGATIVE
Ketones, ur: NEGATIVE
Leukocytes, UA: NEGATIVE
Nitrite: NEGATIVE
RBC / HPF: NONE SEEN /HPF (ref 0–2)
SQUAMOUS EPITHELIAL / LPF: NONE SEEN /HPF (ref <=5)
Specific Gravity, Urine: 1.025 (ref 1.001–1.03)
pH: 5.5 (ref 5.0–8.0)

## 2019-01-03 LAB — MICROSCOPIC MESSAGE

## 2019-01-03 MED ORDER — DOXAZOSIN MESYLATE 4 MG PO TABS: 4.0000 mg | ORAL_TABLET | Freq: Every day | ORAL | 3 refills | Status: DC

## 2019-01-03 NOTE — Progress Notes (Signed)
Subjective:    Patient ID: Manuel Grant, male    DOB: [birth year], 73 y.o.   MRN: [id number]  Medication Refill    Patient was recently admitted to the hospital with DVT in right posterior tibial vein, soleal vein and saphenous vein from calf to mid thigh.  I have copied relevant portions of the discharge summary below for my reference:   Brief H and P: For complete details please refer to admission H and P, but in briefPatient is a 73 year old malewith medical history significant of CAD, HTN, HLD, obesity, CKD stage II, DM type II, ulcerative colitis status post colectomy chronic diastolic CHF, presented with pain and redness of right lower extremity. Patient has a history of prior DVT, had GI bleeding with anticoagulation, hence not on anticoagulation. Denies any long trips, no history of malignancy. Patient had left lower extremity DVT a year ago and was started on Xarelto, developed bleeding duodenal ulcer. Has a history of chronic ulcerative colitis status post colectomy, colonoscopy in 12/2016 showed chronic nonhealing poulch ulceration, which has caused intermittent bleeding.    Hospital Course:  RLE DVT (deep venous thrombosis) (HCC) -Doppler ultrasound of the right lower extremity showed DVT in the right PTV, soleal veins, superficial thrombosis in the right GSV, from calf of the groin but not in the saphenofemoral junction. - Patient was placed on heparin drip, has tolerated it for over 48 hours without any incidents of bleeding - Vascular surgery was consulted, patient seen by Dr. Trula Slade, recommended repeat ultrasound of the right lower extremity. Repeat US showed no propagation noted after eating either DVT or SVT since the previous study. - Discussed in detail with Dr. Trula Slade, recommended xarelto lower dose 20 mg daily with supper for 1 month. Patient's gastroenterologist, Dr Hilarie Fredrickson is currently unavailable, patient had previously tolerated xarelto for 9 months. -  Dr. Trula Slade will follow the patient in 2 weeks in office, if patient has any further bleeding episodes, he will be taken off xarelto at that point and placed on aspirin.  Diabetes mellitus type 2 with neurological manifestations ([place]) -CBGs not well controlled, hemoglobin A1c was 7.0on8/16/2018 -Continue glipizide, metformin and Victoza  Essential hypertension, benign -BP stable, continue Norvasc, lisinopril, metoprolol  Pure hypercholesterolemia -Continue pravastatin  Ulcerative colitis ([place]) - Currently no bleeding or acute issues  CAD (coronary artery disease) -Currently no chest pain or shortness of breath, continue beta-blocker, Imdur  Here for follow up.  Patient is doing well on Xarelto 20 mg a day.  He is tolerating the medication without side effects.  He denies any melena or hematochezia.  He denies any abdominal pain.  He denies any hematemesis.  He denies any chest pain or pleurisy.  He is here today for hospital follow-up.  I also reviewed his lab work from the hospital.  Hemoglobin A1c obtained in the hospital was 7.2 which given age and medical comorbidities I believe is adequate.  He has not had a CBC since taking Xarelto.  At that time, my plan was: Had a discussion with the patient today regarding his options.  This is his second DVT.  This was unprovoked.  He is currently seeing vascular surgery who is recommended 1 additional month of Xarelto and then repeat lower extremity venous Doppler.  Plan is to discontinue Xarelto and begin baby aspirin for prophylaxis long-term.  Today I discussed with the patient continuing Xarelto 10 mg at reduced dose long-term for prevention of thromboembolism.  I explained to the patient that this  is not been studied in his situation.  He has a very unique situation.  There are risk and benefits to either option.  If the patient did elect to continue low-dose Xarelto indefinitely, we would need to monitor CBC closely.  I suggested  having the patient swing by for hemoglobin monthly and then obviously if he had any melena or dark stools to be checked immediately.  After a long discussion weighing the risk and benefits, the patient elects to complete 1 additional month of Xarelto and then switch to baby aspirin as recommended by his vascular surgeon.  I will recheck a CBC today to monitor for any evidence of an occult GI bleed to ensure stability of his hemoglobin.  We discussed his A1c which I believe is acceptable at 7.2.  I would like to check a fasting lipid panel and a urine microalbumin at his earliest convenience.  He can return fasting anytime for this.  09/26/18 Patient is here today for follow-up.  He is overdue to recheck his diabetes mellitus.  I asked the patient has sugars are running, and is clearly apparent that he is not checking his sugars often at all.  He denies any hypoglycemia.  He states that his sugars are usually under 150.  He is checking in the morning when he checks.  He denies any polyuria, polydipsia, or blurry vision.  His blood pressure is elevated today at 142/110.  He is not checking his blood pressure at home.  When I asked him what his blood pressures running at home, he states that it is good at home.  Apparently he checks episodically and usually is in the 120s to 130s over 90.  He denies any chest pain shortness of breath or dyspnea on exertion.  He denies any myalgias or right upper quadrant pain.  He is overdue to recheck a CBC to monitor his anemia.  He is due for his flu shot.  His biggest concern today is increasing issues with urination.  He states that he has a weak urinary stream.  He reports 3-4 episodes of nocturia every evening.  He states that when he uses the bathroom, his urine drips and dribbles.  He never feels like he is able to completely empty his bladder.  He denies any dysuria or hematuria.  He does report urgency and frequency however.  Digital rectal exam was performed today and  shows no significant BPH by exam although his symptoms are consistent with this.  At that time, my plan was: Patient received his flu shot today.  Patient's rectal exam shows mild prostatic enlargement.  I will check a PSA as he is overdue for prostate cancer screening.  Based on the results, I will likely start the patient on Flomax 0.4 mg p.o. nightly to see if his symptoms will improve as his symptoms are consistent with LUTS due to BPH.  His blood pressure today is not well controlled however the patient is resistant to start any medication.  I have asked him to check his blood pressure morning and afternoon for 1 week and report the values to me so that we can titrate his medication if greater than 140/90.  I will check a fasting lipid panel.  Goal LDL cholesterol is less than 70.  I will check a hemoglobin A1c.  Goal hemoglobin A1c is less than 7.  Patient is currently on aspirin for secondary prevention of cardiovascular disease.  He is no longer taking Xarelto  01/03/19 Patient presents today  complaining of urinary frequency.  He reports urinary urgency.  He states that he will suddenly get the urge to urinate.  At times he has urge incontinence.  He cannot make it to the bathroom in time.  However when he goes to the bathroom, he has a very weak stream.  It takes a long time for him to start to urinate.  It will drip and dribble.  He will feel that he is completely emptied his bladder and then a few minutes later he will have experienced urinary leakage wetting his pants.  He reports 3-4 episodes of nocturia every evening.  He never reports a strong urinary stream.  It is always weak and diminished.  Symptoms sound consistent with lower urinary tract symptoms of BPH versus overactive bladder.  PSA was checked in September and was unremarkable.  His blood pressure today however is extremely high.  He insists that he is compliant with his blood pressure medications.  Urinalysis was obtained today that shows  no blood, no nitrites, and no leukocyte esterase. Past Medical History:  Diagnosis Date  . B12 deficiency   . Chronic deep vein thrombosis (DVT) ([place]) dx 2015  start on xarelto  LLE--  stopped in 03/ 2016 due to GI bleed   last doppler 02-09-2015 chronic left common femoral and popliteal dvt's (mild partial resolution since dx 2015)  . CKD (chronic kidney disease), stage II   . Coronary artery disease cardiologist-  dr berry   a. 10/1998 Cath: LM 30d, LAD 34m OM 50, RCA 100 w/ L->R collats, nl LV ->Med Rx;  b. 07/2010 MV: no ischemia.  . DDD (degenerative disc disease), cervical   . GERD (gastroesophageal reflux disease)   . Hiatal hernia   . History of adenomatous polyp of colon   . History of balanitis    05/ 2017  . History of duodenal ulcer    w/ GI bleed in 2015 and 2016  . History of GI bleed    secondary to upper duodenal ulcer's 2015 and 2016   . History of Helicobacter pylori infection    2015  . History of kidney stones   . Hyperlipidemia   . Hypertension   . Ileo-anal pouch ulcer (HCC)    chronic  . Insomnia   . Lower urinary tract symptoms (LUTS)   . Osteoarthritis    knees  . Phimosis   . Rosacea   . Type 2 diabetes mellitus ([place]    last A1c 8.5 on 03-30-2016  . Ulcerative colitis followed by  [name]   [facility] Wears [facility]ures    upper  . Wears glasses    Past Surgical History:  Procedure Laterality Date  . CARDIAC CATHETERIZATION  11/15/1998   dr berry   30% distal left main, 75% mid LAD, 50% OM and total RCA with left to right collateral and normal LV function.  .[name] KitchenCIRCUMCISION N/A 09/25/2016   Procedure: CIRCUMCISION ADULT;  Surgeon: TAlexis Frock MD;  [facility]  Service: Urology;  Laterality: N/A;  . COMPLETION PROCTECTOMY/ CONSTRUCTION ILEAL J POUCH/ DIVERTING LOOP ILEOSTOMY/ EXTENSIVE LYSIS ADHESIONS  11-07-2004   [facility]  . ESOPHAGOGASTRODUODENOSCOPY N/A 03/16/2014   Procedure:  ESOPHAGOGASTRODUODENOSCOPY (EGD);  Surgeon: JJerene Bears MD;  [facility];  Service: Endoscopy;  Laterality: N/A;  . FLEXIBLE SIGMOIDOSCOPY N/A 03/16/2014   Procedure: FLEXIBLE SIGMOIDOSCOPY;  Surgeon: JJerene Bears MD;  [facility];  Service: Endoscopy;  Laterality: N/A;  . KNEE ARTHROSCOPY Bilateral 1990's  . NM MYOCAR PERF WALL MOTION  08/06/2010   dr berry   Low risk nuclear study w/ no ischemia (no significant change compared to prior study)/  normal LV function and wall motion , ef 60%  . SUBTOTAL COLECTOMY  W/ END ILEOSTOMY  05/ 2005   [facility]  . TAKE DOWN LOOP ILEOSTOMY  01-28-2005    [facility]  . TRANSTHORACIC ECHOCARDIOGRAM  11/16/2007   mild concentric LVH,  ef >55%,  grade 1 diastolic dysfuction/  trivial MR and TR/  borderline RVE/  focal nodular thickening of noncoronary cusp of trileaflet AV/     Current Outpatient Medications on File Prior to Visit  Medication Sig Dispense Refill  . amLODipine (NORVASC) 10 MG tablet TAKE 1 TABLET BY MOUTH ONCE DAILY 90 tablet 1  . isosorbide mononitrate (IMDUR) 60 MG 24 hr tablet Take 1 tablet (60 mg total) by mouth daily. ** DO NOT CRUSH ** 90 tablet 0  . lisinopril-hydrochlorothiazide (PRINZIDE,ZESTORETIC) 20-12.5 MG tablet Take 1 tablet by mouth daily. 90 tablet 2  . lovastatin (MEVACOR) 10 MG tablet Take 1 tablet (10 mg total) by mouth at bedtime. 30 tablet 7  . metFORMIN (GLUCOPHAGE) 1000 MG tablet TAKE 1 TABLET BY MOUTH TWICE A DAY WITH FOOD. (Patient taking differently: TAKE 1000 mg  TABLET BY MOUTH TWICE A DAY WITH FOOD.) 60 tablet 1  . metoprolol succinate (TOPROL-XL) 50 MG 24 hr tablet TAKE 1 AND 1/2 TABLETS BY MOUTH DAILY. TAKE WITH OR IMMEDIATELY FOLLOWING A MEAL 45 tablet 11  . minocycline (MINOCIN,DYNACIN) 50 MG capsule Take 1 capsule by mouth every morning.     . pantoprazole (PROTONIX) 40 MG tablet Take 1 tablet (40 mg total) by mouth daily. 90 tablet 3  . tiZANidine (ZANAFLEX) 4 MG tablet TAKE 1 TABLET BY MOUTH EVERY  NIGHT AT BEDTIME AS NEEDED 30 tablet 2  . traMADol (ULTRAM) 50 MG tablet TAKE 2 TABLETS BY MOUTH 3 TIMES A DAY ASNEEDED FOR CHRONIC PAIN 180 tablet 0  . VICTOZA 18 MG/3ML SOPN INJECT 1.2MG UNDER THE SKIN ONCE A DAY 6 mL 3  . zolpidem (AMBIEN) 10 MG tablet TAKE 1 TABLET BY MOUTH EVERY NIGHT AT BEDTIME AS NEEDED FOR INSOMNIA 30 tablet 3   No current facility-administered medications on file prior to visit.    No Known Allergies Social History   Socioeconomic History  . Marital status: Married    Spouse name: Not on file  . Number of children: 2  . Years of education: Not on file  . Highest education level: Not on file  Occupational History  . Occupation: retired  Scientific laboratory technician  . Financial resource strain: Not on file  . Food insecurity:    Worry: Not on file    Inability: Not on file  . Transportation needs:    Medical: Not on file    Non-medical: Not on file  Tobacco Use  . Smoking status: Former Smoker    Packs/day: 2.00    Years: 9.00    Pack years: 18.00    Types: Cigarettes    Last attempt to quit: 12/28/1974    Years since quitting: 44.0  . Smokeless tobacco: Never Used  Substance and Sexual Activity  . Alcohol use: Yes    Alcohol/week: 0.0 standard drinks    Comment: occ  . Drug use: No  . Sexual activity: Not on file  Lifestyle  . Physical activity:    Days per week:  Not on file    Minutes per session: Not on file  . Stress: Not on file  Relationships  . Social connections:    Talks on phone: Not on file    Gets together: Not on file    Attends religious service: Not on file    Active member of club or organization: Not on file    Attends meetings of clubs or organizations: Not on file    Relationship status: Not on file  . Intimate partner violence:    Fear of current or ex partner: Not on file    Emotionally abused: Not on file    Physically abused: Not on file    Forced sexual activity: Not on file  Other Topics Concern  . Not on file  Social  History Narrative   Married 1967   Retired from [employer] work   Lives in [place] with his wife.      Review of Systems  All other systems reviewed and are negative.      Objective:   Physical Exam  Constitutional: He appears well-developed and well-nourished.  Cardiovascular: Normal rate, regular rhythm and normal heart sounds.  No murmur heard. Pulmonary/Chest: Effort normal and breath sounds normal. No respiratory distress. He has no wheezes. He has no rales.  Abdominal: Soft. Bowel sounds are normal. He exhibits no distension. There is no abdominal tenderness. There is no rebound and no guarding.  Musculoskeletal:        General: No edema.  Vitals reviewed.         Assessment & Plan:  Frequent urination - Plan: Urinalysis, Routine w reflex microscopic  Differential diagnosis includes BPH with lower urinary tract symptoms versus overactive bladder.  PSA is unremarkable.  Exam shows mild prostatic enlargement.  However his symptoms are more consistent with lower urinary tract symptoms of BPH rather than overactive bladder particular given the weak dribbling stream.  Because of his elevated blood pressure I am going to start the patient on Cardura 4 mg a day and then recheck his blood pressure in [redacted] weeks along with his urinary symptoms.  Keep overactive bladder in the back of the differential in case symptoms are not improving.

## 2019-01-18 ENCOUNTER — Other Ambulatory Visit: Payer: Self-pay | Admitting: Family Medicine

## 2019-01-24 ENCOUNTER — Ambulatory Visit: Payer: PPO | Admitting: Family Medicine

## 2019-01-26 ENCOUNTER — Other Ambulatory Visit: Payer: Self-pay | Admitting: Family Medicine

## 2019-01-26 ENCOUNTER — Other Ambulatory Visit: Payer: PPO

## 2019-01-26 DIAGNOSIS — E118 Type 2 diabetes mellitus with unspecified complications: Secondary | ICD-10-CM

## 2019-01-27 LAB — CBC WITH DIFFERENTIAL/PLATELET
Absolute Monocytes: 628 cells/uL (ref 200–950)
Basophils Absolute: 31 cells/uL (ref 0–200)
Basophils Relative: 0.5 %
Eosinophils Absolute: 153 cells/uL (ref 15–500)
Eosinophils Relative: 2.5 %
HCT: 39.8 % (ref 38.5–50.0)
HEMOGLOBIN: 13.2 g/dL (ref 13.2–17.1)
Lymphs Abs: 2111 cells/uL (ref 850–3900)
MCH: 28.4 pg (ref 27.0–33.0)
MCHC: 33.2 g/dL (ref 32.0–36.0)
MCV: 85.8 fL (ref 80.0–100.0)
MPV: 11.7 fL (ref 7.5–12.5)
Monocytes Relative: 10.3 %
Neutro Abs: 3178 cells/uL (ref 1500–7800)
Neutrophils Relative %: 52.1 %
Platelets: 149 10*3/uL (ref 140–400)
RBC: 4.64 10*6/uL (ref 4.20–5.80)
RDW: 15.6 % — ABNORMAL HIGH (ref 11.0–15.0)
Total Lymphocyte: 34.6 %
WBC: 6.1 10*3/uL (ref 3.8–10.8)

## 2019-01-27 LAB — COMPLETE METABOLIC PANEL WITH GFR
AG Ratio: 1.2 (calc) (ref 1.0–2.5)
ALT: 24 U/L (ref 9–46)
AST: 28 U/L (ref 10–35)
Albumin: 3.9 g/dL (ref 3.6–5.1)
Alkaline phosphatase (APISO): 41 U/L (ref 40–115)
BUN/Creatinine Ratio: 11 (calc) (ref 6–22)
BUN: 15 mg/dL (ref 7–25)
CO2: 30 mmol/L (ref 20–32)
Calcium: 9.4 mg/dL (ref 8.6–10.3)
Chloride: 104 mmol/L (ref 98–110)
Creat: 1.37 mg/dL — ABNORMAL HIGH (ref 0.70–1.18)
GFR, Est African American: 59 mL/min/{1.73_m2} — ABNORMAL LOW (ref >=60)
GFR, Est Non African American: 51 mL/min/{1.73_m2} — ABNORMAL LOW (ref >=60)
Globulin: 3.2 g/dL (calc) (ref 1.9–3.7)
Glucose, Bld: 93 mg/dL (ref 65–99)
Potassium: 4.5 mmol/L (ref 3.5–5.3)
Sodium: 141 mmol/L (ref 135–146)
Total Bilirubin: 0.4 mg/dL (ref 0.2–1.2)
Total Protein: 7.1 g/dL (ref 6.1–8.1)

## 2019-01-27 LAB — HEMOGLOBIN A1C
Hgb A1c MFr Bld: 7.5 % of total Hgb — ABNORMAL HIGH (ref <5.7)
Mean Plasma Glucose: 169 (calc)
eAG (mmol/L): 9.3 (calc)

## 2019-01-27 LAB — LIPID PANEL
Cholesterol: 139 mg/dL (ref <200)
HDL: 39 mg/dL — ABNORMAL LOW (ref >40)
LDL CHOLESTEROL (CALC): 70 mg/dL
Non-HDL Cholesterol (Calc): 100 mg/dL (calc) (ref <130)
Total CHOL/HDL Ratio: 3.6 (calc) (ref <5.0)
Triglycerides: 208 mg/dL — ABNORMAL HIGH (ref <150)

## 2019-02-01 ENCOUNTER — Encounter: Payer: Self-pay | Admitting: Family Medicine

## 2019-02-01 ENCOUNTER — Other Ambulatory Visit: Payer: Self-pay | Admitting: Family Medicine

## 2019-02-01 MED ORDER — SITAGLIPTIN PHOSPHATE 100 MG PO TABS: 100.0000 mg | ORAL_TABLET | Freq: Every day | ORAL | 3 refills | Status: DC

## 2019-02-01 NOTE — Telephone Encounter (Signed)
Ok to refill??  Last office visit 01/03/2019.  Last refill 12/27/2018.

## 2019-02-02 ENCOUNTER — Other Ambulatory Visit: Payer: Self-pay | Admitting: Family Medicine

## 2019-02-02 MED ORDER — SITAGLIPTIN PHOSPHATE 100 MG PO TABS: 100.0000 mg | ORAL_TABLET | Freq: Every day | ORAL | 3 refills | Status: DC

## 2019-03-08 ENCOUNTER — Other Ambulatory Visit: Payer: Self-pay | Admitting: Family Medicine

## 2019-03-08 NOTE — Telephone Encounter (Signed)
Requesting refill  Tramadol    LOV: 01/03/19  LRF:  02/02/19

## 2019-03-09 MED ORDER — TRAMADOL HCL 50 MG PO TABS: ORAL_TABLET | ORAL | 0 refills | Status: DC

## 2019-03-09 NOTE — Addendum Note (Signed)
Addended by: Shary Decamp B on: 03/09/2019 10:43 AM   Modules accepted: Orders

## 2019-03-09 NOTE — Telephone Encounter (Signed)
Re-send

## 2019-03-21 ENCOUNTER — Other Ambulatory Visit: Payer: Self-pay | Admitting: Cardiovascular Disease

## 2019-03-30 ENCOUNTER — Other Ambulatory Visit: Payer: Self-pay | Admitting: Family Medicine

## 2019-03-30 NOTE — Telephone Encounter (Signed)
Ok to refill??  Last office visit 01/03/2019.  Last refill 03/09/2019.

## 2019-04-21 ENCOUNTER — Other Ambulatory Visit: Payer: Self-pay | Admitting: Family Medicine

## 2019-04-21 ENCOUNTER — Other Ambulatory Visit: Payer: Self-pay | Admitting: Cardiovascular Disease

## 2019-04-21 NOTE — Telephone Encounter (Signed)
Ok to refill??  Last office visit 01/03/2019.  Last  12/10/2019, refill.

## 2019-04-21 NOTE — Telephone Encounter (Signed)
imdur 60 mg refilled.

## 2019-04-25 ENCOUNTER — Telehealth: Payer: Self-pay | Admitting: Cardiovascular Disease

## 2019-04-25 NOTE — Telephone Encounter (Signed)
LVM for patient to schedule appt with Dr. Gwenlyn Found.

## 2019-05-04 ENCOUNTER — Other Ambulatory Visit: Payer: Self-pay | Admitting: Family Medicine

## 2019-05-04 NOTE — Telephone Encounter (Signed)
Ok to refill 

## 2019-05-12 ENCOUNTER — Telehealth: Payer: Self-pay

## 2019-05-12 ENCOUNTER — Telehealth (INDEPENDENT_AMBULATORY_CARE_PROVIDER_SITE_OTHER): Payer: PPO | Admitting: Cardiovascular Disease

## 2019-05-12 ENCOUNTER — Encounter: Payer: Self-pay | Admitting: Cardiovascular Disease

## 2019-05-12 DIAGNOSIS — I2510 Atherosclerotic heart disease of native coronary artery without angina pectoris: Secondary | ICD-10-CM | POA: Diagnosis not present

## 2019-05-12 DIAGNOSIS — I82599 Chronic embolism and thrombosis of other specified deep vein of unspecified lower extremity: Secondary | ICD-10-CM | POA: Diagnosis not present

## 2019-05-12 DIAGNOSIS — I10 Essential (primary) hypertension: Secondary | ICD-10-CM

## 2019-05-12 DIAGNOSIS — I2583 Coronary atherosclerosis due to lipid rich plaque: Secondary | ICD-10-CM

## 2019-05-12 DIAGNOSIS — E7800 Pure hypercholesterolemia, unspecified: Secondary | ICD-10-CM

## 2019-05-12 NOTE — Telephone Encounter (Signed)
LEFT DETAILED MESSAGE OF AVS INSTRUCTIONS ON PT VM PER DPR. Letter including After Visit Summary and any other necessary documents to be mailed to the patient's address on file.

## 2019-05-12 NOTE — Patient Instructions (Signed)

## 2019-05-12 NOTE — Progress Notes (Signed)
Virtual Visit via Telephone Note   This visit type was conducted due to national recommendations for restrictions regarding the COVID-19 Pandemic (e.g. social distancing) in an effort to limit this patient's exposure and mitigate transmission in our community.  Due to his co-morbid illnesses, this patient is at least at moderate risk for complications without adequate follow up.  This format is felt to be most appropriate for this patient at this time.  The patient did not have access to video technology/had technical difficulties with video requiring transitioning to audio format only (telephone).  All issues noted in this document were discussed and addressed.  No physical exam could be performed with this format.  Please refer to the patient's chart for his  consent to telehealth for [facility].   Date:  05/12/2019   ID:  Manuel Grant, DOB 1946-06-06, MRN [id number]  Patient Location: Home Provider Location: Home  PCP:  [name], MD  Cardiologist: Dr. Quay Burow Electrophysiologist:  None   Evaluation Performed:  Follow-Up Visit  Chief Complaint: Follow-up CAD  History of Present Illness:     Manuel Grant is a 73 y.o. male  mildly to moderately overweight married Caucasian male father of 38, grandfather to 3 grandchildren who I saw 07/30/2017. He has a history of ischemic heart disease status post cardiac catheterization by myself November 15, 1998 revealing 3-vessel disease with preserved LV function. Medical therapy was recommended at that time. He had a 30% distal left main, a 75% mid LAD, a 50% OM and a total RCA with left-to-right collaterals and normal LV function. His other problems include hypertension and hyperlipidemia. He is totally asymptomatic. His last Myoview performed August 06, 2010 was nonischemic. Lipid profile performed 03/08/14 revealed a total cholesterol of 98, LDL 31 and HDL 41. The last saw him in October he developed progressive dyspnea on  exertion and substernal chest pain. I plan to performing outpatient cardiac catheterization on him however his preprocedure labs revealed profound anemia requiring workup including endoscopy. He was placed on oral therapy for H. Pylori and transfused. His cardiac symptoms have resolved. Back in March he was diagnosed with a left lower extremity deep venous thrombosis involving his common femoral, popliteal and peroneal veins. He was placed on Xarelto oral anticoagulation at that time which resulted in clinical improvement. Followup his Dopplers in July revealed mild partial resolution and venous Dopplers once again performed 02/09/15 showed chronic DVT without acute changes.  He was on Xarelto which was discontinued because of GI bleed.  He now is on a baby aspirin.  Since I saw him 2 years ago he is remained stable.  He denies chest pain or shortness of breath.  The patient does not have symptoms concerning for COVID-19 infection (fever, chills, cough, or new shortness of breath).    Past Medical History:  Diagnosis Date   B12 deficiency    Chronic deep vein thrombosis (DVT) ([place]) dx 2015  start on xarelto  LLE--  stopped in 03/ 2016 due to GI bleed   last doppler 02-09-2015 chronic left common femoral and popliteal dvt's (mild partial resolution since dx 2015)   CKD (chronic kidney disease), stage II    Coronary artery disease cardiologist-  dr Aneesh Faller   a. 10/1998 Cath: LM 30d, LAD 66m OM 50, RCA 100 w/ L->R collats, nl LV ->Med Rx;  b. 07/2010 MV: no ischemia.   DDD (degenerative disc disease), cervical    GERD (gastroesophageal reflux disease)  Hiatal hernia    History of adenomatous polyp of colon    History of balanitis    05/ 2017   History of duodenal ulcer    w/ GI bleed in 2015 and 2016   History of GI bleed    secondary to upper duodenal ulcer's 2015 and 2016    History of Helicobacter pylori infection    2015   History of kidney stones    Hyperlipidemia     Hypertension    Ileo-anal pouch ulcer (HCC)    chronic   Insomnia    Lower urinary tract symptoms (LUTS)    Osteoarthritis    knees   Phimosis    Rosacea    Type 2 diabetes mellitus ([place])    last A1c 8.5 on 03-30-2016   Ulcerative colitis followed by  dr Ulice Dash pyrtle   s/p colectomy ~ 2005 at [facility]   Wears dentures    upper   Wears glasses    Past Surgical History:  Procedure Laterality Date   CARDIAC CATHETERIZATION  11/15/1998   dr Kalista Laguardia   30% distal left main, 75% mid LAD, 50% OM and total RCA with left to right collateral and normal LV function.   CIRCUMCISION N/A 09/25/2016   Procedure: CIRCUMCISION ADULT;  Surgeon: Alexis Frock, MD;  Location: [facility];  Service: Urology;  Laterality: N/A;   COMPLETION PROCTECTOMY/ CONSTRUCTION ILEAL J POUCH/ DIVERTING LOOP ILEOSTOMY/ EXTENSIVE LYSIS ADHESIONS  11-07-2004   [facility]   ESOPHAGOGASTRODUODENOSCOPY N/A 03/16/2014   Procedure: ESOPHAGOGASTRODUODENOSCOPY (EGD);  Surgeon: Jerene Bears, MD;  Location: [facility] ENDOSCOPY;  Service: Endoscopy;  Laterality: N/A;   FLEXIBLE SIGMOIDOSCOPY N/A 03/16/2014   Procedure: FLEXIBLE SIGMOIDOSCOPY;  Surgeon: Jerene Bears, MD;  Location: [facility] ENDOSCOPY;  Service: Endoscopy;  Laterality: N/A;   KNEE ARTHROSCOPY Bilateral 1990's   NM MYOCAR PERF WALL MOTION  08/06/2010   dr Jahari Wiginton   Low risk nuclear study w/ no ischemia (no significant change compared to prior study)/  normal LV function and wall motion , ef 60%   SUBTOTAL COLECTOMY  W/ END ILEOSTOMY  05/ 2005   [facility]   TAKE DOWN LOOP ILEOSTOMY  01-28-2005    [facility]   TRANSTHORACIC ECHOCARDIOGRAM  11/16/2007   mild concentric LVH,  ef >55%,  grade 1 diastolic dysfuction/  trivial MR and TR/  borderline RVE/  focal nodular thickening of noncoronary cusp of trileaflet AV/       No outpatient medications have been marked as taking for the 05/12/19 encounter (Appointment) with [name], MD.     Allergies:   Patient has no  known allergies.   Social History   Tobacco Use   Smoking status: Former Smoker    Packs/day: 2.00    Years: 9.00    Pack years: 18.00    Types: Cigarettes    Last attempt to quit: 12/28/1974    Years since quitting: 44.4   Smokeless tobacco: Never Used  Substance Use Topics   Alcohol use: Yes    Alcohol/week: 0.0 standard drinks    Comment: occ   Drug use: No     Family Hx: The patient's family history includes Arthritis in his mother; Asthma in his father; COPD in his father; Colon polyps in his brother; Diabetes in his mother; Emphysema in his father; Heart disease in his mother; Hypertension in his mother. There is no history of Colon cancer or Prostate cancer.  ROS:   Please see the history of present illness.  All other systems reviewed and are negative.   Prior CV studies:   The following studies were reviewed today:  None  Labs/Other Tests and Data Reviewed:    EKG:  No ECG reviewed.  Recent Labs: 01/26/2019: ALT 24; BUN 15; Creat 1.37; Hemoglobin 13.2; Platelets 149; Potassium 4.5; Sodium 141   Recent Lipid Panel Lab Results  Component Value Date/Time   CHOL 139 01/26/2019 03:19 PM   TRIG 208 (H) 01/26/2019 03:19 PM   HDL 39 (L) 01/26/2019 03:19 PM   CHOLHDL 3.6 01/26/2019 03:19 PM   LDLCALC 70 01/26/2019 03:19 PM   LDLDIRECT 55.5 02/27/2013 08:21 AM    Wt Readings from Last 3 Encounters:  01/03/19 218 lb (98.9 kg)  09/26/18 217 lb (98.4 kg)  08/24/18 219 lb (99.3 kg)     Objective:    Vital Signs:  There were no vitals taken for this visit.   VITAL SIGNS:  reviewed the patient was unable to check his vital signs at home.  This was a telemedicine virtual phone visit and therefore a physical exam was not performed.  ASSESSMENT & PLAN:    1. Coronary artery disease- history of CAD status post cardiac catheterization performed by myself 11/15/1998 revealing moderate three-vessel disease with normal LV function.  Medical therapy was  recommended.  He has been stable since. 2. Essential hypertension- history of essential hypertension on amlodipine, metoprolol, lisinopril and hydrochlorothiazide followed by his PCP 3. Hyperlipidemia- history of hyperlipidemia on lovastatin with lipid profile performed 01/26/2019 revealing total cholesterol 139, LDL 70 and HDL 39 4. DVT- history of DVT in the past with most recent Doppler revealing this to be chronic on Xarelto oral anticoagulation in the past discontinued because of GI bleeding.  COVID-19 Education: The signs and symptoms of COVID-19 were discussed with the patient and how to seek care for testing (follow up with PCP or arrange E-visit).  The importance of social distancing was discussed today.  Time:   Today, I have spent 4 minutes with the patient with telehealth technology discussing the above problems.     Medication Adjustments/Labs and Tests Ordered: Current medicines are reviewed at length with the patient today.  Concerns regarding medicines are outlined above.   Tests Ordered: No orders of the defined types were placed in this encounter.   Medication Changes: No orders of the defined types were placed in this encounter.   Disposition:  Follow up in 1 year(s)  Signed, Quay Burow, MD  05/12/2019 11:21 AM    [place]

## 2019-05-16 ENCOUNTER — Other Ambulatory Visit: Payer: Self-pay | Admitting: Family Medicine

## 2019-05-16 NOTE — Telephone Encounter (Signed)
Ok to refill??  Last office visit 01/03/2019.  Last refill 03/30/2019.

## 2019-05-20 ENCOUNTER — Other Ambulatory Visit: Payer: Self-pay | Admitting: Cardiovascular Disease

## 2019-05-29 ENCOUNTER — Other Ambulatory Visit: Payer: Self-pay | Admitting: Family Medicine

## 2019-06-12 ENCOUNTER — Telehealth: Payer: Self-pay

## 2019-06-12 NOTE — Telephone Encounter (Signed)
Spoke with the patient. Advised he is due iron studies. No appointment needed for labs. Thanks me for the reminder call.

## 2019-06-15 ENCOUNTER — Other Ambulatory Visit (INDEPENDENT_AMBULATORY_CARE_PROVIDER_SITE_OTHER): Payer: PPO

## 2019-06-15 DIAGNOSIS — D509 Iron deficiency anemia, unspecified: Secondary | ICD-10-CM

## 2019-06-15 LAB — CBC WITH DIFFERENTIAL/PLATELET
Basophils Absolute: 0.1 10*3/uL (ref 0.0–0.1)
Basophils Relative: 1.4 % (ref 0.0–3.0)
Eosinophils Absolute: 0.1 10*3/uL (ref 0.0–0.7)
Eosinophils Relative: 1.9 % (ref 0.0–5.0)
HCT: 44.3 % (ref 39.0–52.0)
Hemoglobin: 14.8 g/dL (ref 13.0–17.0)
Lymphocytes Relative: 28.5 % (ref 12.0–46.0)
Lymphs Abs: 1.6 10*3/uL (ref 0.7–4.0)
MCHC: 33.4 g/dL (ref 30.0–36.0)
MCV: 88 fl (ref 78.0–100.0)
Monocytes Absolute: 0.5 10*3/uL (ref 0.1–1.0)
Monocytes Relative: 9.6 % (ref 3.0–12.0)
Neutro Abs: 3.3 10*3/uL (ref 1.4–7.7)
Neutrophils Relative %: 58.6 % (ref 43.0–77.0)
Platelets: 136 10*3/uL — ABNORMAL LOW (ref 150.0–400.0)
RBC: 5.04 Mil/uL (ref 4.22–5.81)
RDW: 14.9 % (ref 11.5–15.5)
WBC: 5.6 10*3/uL (ref 4.0–10.5)

## 2019-06-15 LAB — IBC PANEL
Iron: 55 ug/dL (ref 42–165)
Saturation Ratios: 14.5 % — ABNORMAL LOW (ref 20.0–50.0)
Transferrin: 271 mg/dL (ref 212.0–360.0)

## 2019-06-16 LAB — FERRITIN: Ferritin: 71.4 ng/mL (ref 22.0–322.0)

## 2019-06-21 ENCOUNTER — Other Ambulatory Visit: Payer: Self-pay

## 2019-06-21 DIAGNOSIS — D509 Iron deficiency anemia, unspecified: Secondary | ICD-10-CM

## 2019-06-22 ENCOUNTER — Other Ambulatory Visit: Payer: Self-pay | Admitting: Family Medicine

## 2019-06-22 ENCOUNTER — Encounter: Payer: Self-pay | Admitting: Family Medicine

## 2019-06-22 NOTE — Telephone Encounter (Signed)
Requesting refill    Tramadol  LOV: 01/03/19  LRF:   05/16/19

## 2019-06-22 NOTE — Telephone Encounter (Signed)
Due for ov.

## 2019-06-23 ENCOUNTER — Encounter: Payer: Self-pay | Admitting: Family Medicine

## 2019-06-23 ENCOUNTER — Other Ambulatory Visit: Payer: Self-pay

## 2019-06-23 ENCOUNTER — Ambulatory Visit (INDEPENDENT_AMBULATORY_CARE_PROVIDER_SITE_OTHER): Payer: PPO | Admitting: Family Medicine

## 2019-06-23 ENCOUNTER — Ambulatory Visit
Admission: RE | Admit: 2019-06-23 | Discharge: 2019-06-23 | Disposition: A | Discharge status: Home / Self Care | Payer: PPO | Source: Ambulatory Visit | Attending: Family Medicine | Admitting: Family Medicine

## 2019-06-23 VITALS — BP 130/98 | HR 82 | Temp 99.2°F | Resp 16 | Ht 66.75 in | Wt 223.0 lb

## 2019-06-23 DIAGNOSIS — I10 Essential (primary) hypertension: Secondary | ICD-10-CM

## 2019-06-23 DIAGNOSIS — M79671 Pain in right foot: Secondary | ICD-10-CM

## 2019-06-23 DIAGNOSIS — E118 Type 2 diabetes mellitus with unspecified complications: Secondary | ICD-10-CM

## 2019-06-23 DIAGNOSIS — E7800 Pure hypercholesterolemia, unspecified: Secondary | ICD-10-CM | POA: Diagnosis not present

## 2019-06-23 DIAGNOSIS — I2510 Atherosclerotic heart disease of native coronary artery without angina pectoris: Secondary | ICD-10-CM

## 2019-06-23 DIAGNOSIS — M7731 Calcaneal spur, right foot: Secondary | ICD-10-CM | POA: Diagnosis not present

## 2019-06-23 NOTE — Progress Notes (Signed)
Subjective:    Patient ID: Manuel Grant, male    DOB: 1946-08-04, 73 y.o.   MRN: [id number]  Medication Refill   Patient was recently admitted to the hospital with DVT in right posterior tibial vein, soleal vein and saphenous vein from calf to mid thigh.  I have copied relevant portions of the discharge summary below for my reference:   Brief H and P: For complete details please refer to admission H and P, but in briefPatient is a 73 year old malewith medical history significant of CAD, HTN, HLD, obesity, CKD stage II, DM type II, ulcerative colitis status post colectomy chronic diastolic CHF, presented with pain and redness of right lower extremity. Patient has a history of prior DVT, had GI bleeding with anticoagulation, hence not on anticoagulation. Denies any long trips, no history of malignancy. Patient had left lower extremity DVT a year ago and was started on Xarelto, developed bleeding duodenal ulcer. Has a history of chronic ulcerative colitis status post colectomy, colonoscopy in 12/2016 showed chronic nonhealing poulch ulceration, which has caused intermittent bleeding.    Hospital Course:  RLE DVT (deep venous thrombosis) (HCC) -Doppler ultrasound of the right lower extremity showed DVT in the right PTV, soleal veins, superficial thrombosis in the right GSV, from calf of the groin but not in the saphenofemoral junction. - Patient was placed on heparin drip, has tolerated it for over 48 hours without any incidents of bleeding - Vascular surgery was consulted, patient seen by Dr. Trula Slade, recommended repeat ultrasound of the right lower extremity. Repeat US showed no propagation noted after eating either DVT or SVT since the previous study. - Discussed in detail with Dr. Trula Slade, recommended xarelto lower dose 20 mg daily with supper for 1 month. Patient's gastroenterologist, Dr Hilarie Fredrickson is currently unavailable, patient had previously tolerated xarelto for 9 months. - Dr.  Trula Slade will follow the patient in 2 weeks in office, if patient has any further bleeding episodes, he will be taken off xarelto at that point and placed on aspirin.  Diabetes mellitus type 2 with neurological manifestations ([place]) -CBGs not well controlled, hemoglobin A1c was 7.0on8/16/2018 -Continue glipizide, metformin and Victoza  Essential hypertension, benign -BP stable, continue Norvasc, lisinopril, metoprolol  Pure hypercholesterolemia -Continue pravastatin  Ulcerative colitis ([place]) - Currently no bleeding or acute issues  CAD (coronary artery disease) -Currently no chest pain or shortness of breath, continue beta-blocker, Imdur  Here for follow up.  Patient is doing well on Xarelto 20 mg a day.  He is tolerating the medication without side effects.  He denies any melena or hematochezia.  He denies any abdominal pain.  He denies any hematemesis.  He denies any chest pain or pleurisy.  He is here today for hospital follow-up.  I also reviewed his lab work from the hospital.  Hemoglobin A1c obtained in the hospital was 7.2 which given age and medical comorbidities I believe is adequate.  He has not had a CBC since taking Xarelto.  At that time, my plan was: Had a discussion with the patient today regarding his options.  This is his second DVT.  This was unprovoked.  He is currently seeing vascular surgery who is recommended 1 additional month of Xarelto and then repeat lower extremity venous Doppler.  Plan is to discontinue Xarelto and begin baby aspirin for prophylaxis long-term.  Today I discussed with the patient continuing Xarelto 10 mg at reduced dose long-term for prevention of thromboembolism.  I explained to the patient that this is  not been studied in his situation.  He has a very unique situation.  There are risk and benefits to either option.  If the patient did elect to continue low-dose Xarelto indefinitely, we would need to monitor CBC closely.  I suggested  having the patient swing by for hemoglobin monthly and then obviously if he had any melena or dark stools to be checked immediately.  After a long discussion weighing the risk and benefits, the patient elects to complete 1 additional month of Xarelto and then switch to baby aspirin as recommended by his vascular surgeon.  I will recheck a CBC today to monitor for any evidence of an occult GI bleed to ensure stability of his hemoglobin.  We discussed his A1c which I believe is acceptable at 7.2.  I would like to check a fasting lipid panel and a urine microalbumin at his earliest convenience.  He can return fasting anytime for this.  09/26/18 Patient is here today for follow-up.  He is overdue to recheck his diabetes mellitus.  I asked the patient has sugars are running, and is clearly apparent that he is not checking his sugars often at all.  He denies any hypoglycemia.  He states that his sugars are usually under 150.  He is checking in the morning when he checks.  He denies any polyuria, polydipsia, or blurry vision.  His blood pressure is elevated today at 142/110.  He is not checking his blood pressure at home.  When I asked him what his blood pressures running at home, he states that it is good at home.  Apparently he checks episodically and usually is in the 120s to 130s over 90.  He denies any chest pain shortness of breath or dyspnea on exertion.  He denies any myalgias or right upper quadrant pain.  He is overdue to recheck a CBC to monitor his anemia.  He is due for his flu shot.  His biggest concern today is increasing issues with urination.  He states that he has a weak urinary stream.  He reports 3-4 episodes of nocturia every evening.  He states that when he uses the bathroom, his urine drips and dribbles.  He never feels like he is able to completely empty his bladder.  He denies any dysuria or hematuria.  He does report urgency and frequency however.  Digital rectal exam was performed today and  shows no significant BPH by exam although his symptoms are consistent with this.  At that time, my plan was: Patient received his flu shot today.  Patient's rectal exam shows mild prostatic enlargement.  I will check a PSA as he is overdue for prostate cancer screening.  Based on the results, I will likely start the patient on Flomax 0.4 mg p.o. nightly to see if his symptoms will improve as his symptoms are consistent with LUTS due to BPH.  His blood pressure today is not well controlled however the patient is resistant to start any medication.  I have asked him to check his blood pressure morning and afternoon for 1 week and report the values to me so that we can titrate his medication if greater than 140/90.  I will check a fasting lipid panel.  Goal LDL cholesterol is less than 70.  I will check a hemoglobin A1c.  Goal hemoglobin A1c is less than 7.  Patient is currently on aspirin for secondary prevention of cardiovascular disease.  He is no longer taking Xarelto  01/03/19 Patient presents today complaining  of urinary frequency.  He reports urinary urgency.  He states that he will suddenly get the urge to urinate.  At times he has urge incontinence.  He cannot make it to the bathroom in time.  However when he goes to the bathroom, he has a very weak stream.  It takes a long time for him to start to urinate.  It will drip and dribble.  He will feel that he is completely emptied his bladder and then a few minutes later he will have experienced urinary leakage wetting his pants.  He reports 3-4 episodes of nocturia every evening.  He never reports a strong urinary stream.  It is always weak and diminished.  Symptoms sound consistent with lower urinary tract symptoms of BPH versus overactive bladder.  PSA was checked in September and was unremarkable.  His blood pressure today however is extremely high.  He insists that he is compliant with his blood pressure medications.  Urinalysis was obtained today that shows  no blood, no nitrites, and no leukocyte esterase.  At that time, my plan was: Differential diagnosis includes BPH with lower urinary tract symptoms versus overactive bladder.  PSA is unremarkable.  Exam shows mild prostatic enlargement.  However his symptoms are more consistent with lower urinary tract symptoms of BPH rather than overactive bladder particular given the weak dribbling stream.  Because of his elevated blood pressure I am going to start the patient on Cardura 4 mg a day and then recheck his blood pressure in [redacted] weeks along with his urinary symptoms.  Keep overactive bladder in the back of the differential in case symptoms are not improving.  06/23/19 Patient is here today for follow-up.  His main reason for scheduling this appointment is severe pain in his right heel.  On visual inspection of his right foot, there is a hard bony mass on the posterior medial aspect of the right calcaneus.  It is roughly 4 cm in diameter.  It is tender to palpation on the inferior pole.  This is almost near the attachment point of the Achilles tendon.  However it is firm and tender to the touch.  There is no erythema.  It is visibly different compared to the left calcaneus.  He states is been like that for quite some time.  However is now causing him significant pain when he walks.  Please see the diagram for location.  He is overdue for lab work to monitor his diabetes and his cholesterol and his blood counts.  His blood pressure today is slightly better.  The systolic blood pressure is good at 130 however his diastolic blood pressure is elevated at 98.  He states that he is taking his medication every day as directed.  He denies any chest pain shortness of breath or dyspnea on exertion.  He has been taking Voltaren gel and applying it to the center of the skeletal mass on his right heel.  He cannot take NSAIDs due to his history of GI bleed.  Tylenol is not helping the pain.  Tramadol is not helping the pain. Past  Medical History:  Diagnosis Date   B12 deficiency    Chronic deep vein thrombosis (DVT) ([place]) dx 2015  start on xarelto  LLE--  stopped in 03/ 2016 due to GI bleed   last doppler 02-09-2015 chronic left common femoral and popliteal dvt's (mild partial resolution since dx 2015)   CKD (chronic kidney disease), stage II    Coronary artery disease cardiologist-  dr berry   a. 10/1998 Cath: LM 30d, LAD 65m OM 50, RCA 100 w/ L->R collats, nl LV ->Med Rx;  b. 07/2010 MV: no ischemia.   DDD (degenerative disc disease), cervical    GERD (gastroesophageal reflux disease)    Hiatal hernia    History of adenomatous polyp of colon    History of balanitis    05/ 2017   History of duodenal ulcer    w/ GI bleed in 2015 and 2016   History of GI bleed    secondary to upper duodenal ulcer's 2015 and 2016    History of Helicobacter pylori infection    2015   History of kidney stones    Hyperlipidemia    Hypertension    Ileo-anal pouch ulcer (HCC)    chronic   Insomnia    Lower urinary tract symptoms (LUTS)    Osteoarthritis    knees   Phimosis    Rosacea    Type 2 diabetes mellitus [place]    last A1c 8.5 on 03-30-2016   Ulcerative colitis followed by  [name]   [facility]  Wears dentures    upper   Wears glasses    Past Surgical History:  Procedure Laterality Date   CARDIAC CATHETERIZATION  11/15/1998   dr berry   30% distal left main, 75% mid LAD, 50% OM and total RCA with left to right collateral and normal LV function.   CIRCUMCISION N/A 09/25/2016   Procedure: CIRCUMCISION ADULT;  Surgeon: TAlexis Frock MD;  [facility]  Service: Urology;  Laterality: N/A;   COMPLETION PROCTECTOMY/ CONSTRUCTION ILEAL J POUCH/ DIVERTING LOOP ILEOSTOMY/ EXTENSIVE LYSIS ADHESIONS  11-07-2004   [facility]   ESOPHAGOGASTRODUODENOSCOPY N/A 03/16/2014   Procedure: ESOPHAGOGASTRODUODENOSCOPY (EGD);  Surgeon: JJerene Bears MD;  [facility];  Service: Endoscopy;  Laterality: N/A;   FLEXIBLE SIGMOIDOSCOPY N/A 03/16/2014   Procedure: FLEXIBLE SIGMOIDOSCOPY;  Surgeon: JJerene Bears MD;  [facility];  Service: Endoscopy;  Laterality: N/A;   KNEE ARTHROSCOPY Bilateral 1990's   NM MYOCAR PERF WALL MOTION  08/06/2010   dr berry   Low risk nuclear study w/ no ischemia (no significant change compared to prior study)/  normal LV function and wall motion , ef 60%   SUBTOTAL COLECTOMY  W/ END ILEOSTOMY  05/ 2005   [facility]   TAKE DOWN LOOP ILEOSTOMY  01-28-2005    [facility]   TRANSTHORACIC ECHOCARDIOGRAM  11/16/2007   mild concentric LVH,  ef >55%,  grade 1 diastolic dysfuction/  trivial MR and TR/  borderline RVE/  focal nodular thickening of noncoronary cusp of trileaflet AV/     Current Outpatient Medications on File Prior to Visit  Medication Sig Dispense Refill   amLODipine (NORVASC) 10 MG tablet TAKE 1 TABLET BY MOUTH ONCE DAILY 90 tablet 1   doxazosin (CARDURA) 4 MG tablet TAKE 1 TABLET BY MOUTH ONCE A DAY 30 tablet 3   glipiZIDE (GLUCOTROL XL) 10 MG 24 hr tablet TAKE 1 TABLET BY MOUTH ONCE DAILY 90 tablet 1   isosorbide mononitrate (IMDUR) 60 MG 24 hr tablet TAKE 1 TABLET BY MOUTH ONCE DAILY 30 tablet 1   lisinopril-hydrochlorothiazide (PRINZIDE,ZESTORETIC) 20-12.5 MG tablet Take 1 tablet by mouth daily. 90 tablet 2   metFORMIN (GLUCOPHAGE) 1000 MG tablet TAKE 1 TABLET BY MOUTH TWICE A DAY WITH FOOD. 60 tablet 3   metoprolol succinate (TOPROL-XL) 50 MG 24 hr tablet TAKE 1  AND 1/2 TABLETS BY MOUTH DAILY. TAKE WITH OR IMMEDIATELY FOLLOWING A MEAL 45 tablet 11   minocycline (MINOCIN,DYNACIN) 50 MG capsule Take 1 capsule by mouth every morning.      pantoprazole (PROTONIX) 40 MG tablet Take 1 tablet (40 mg total) by mouth daily. 90 tablet 3   sitaGLIPtin (JANUVIA) 100 MG tablet Take 1 tablet (100 mg total) by mouth daily. 30 tablet 3   tiZANidine (ZANAFLEX) 4 MG tablet TAKE 1 TABLET BY MOUTH EVERY NIGHT AT  BEDTIME AS NEEDED 30 tablet 2   traMADol (ULTRAM) 50 MG tablet TAKE 2 TABLETS BY MOUTH 3 TIMES DAILY ASNEEDED FOR CHRONIC PAIN 180 tablet 0   VICTOZA 18 MG/3ML SOPN INJECT 1.2MG UNDER THE SKIN ONCE A DAY 6 mL 3   zolpidem (AMBIEN) 10 MG tablet TAKE 1 TABLET BY MOUTH EVERY NIGHT AT BEDTIME AS NEEDED FOR INSOMNIA 30 tablet 3   No current facility-administered medications on file prior to visit.    No Known Allergies Social History   Socioeconomic History   Marital status: Married    Spouse name: Not on file   Number of children: 2   Years of education: Not on file   Highest education level: Not on file  Occupational History   Occupation: retired  Scientist, product/process development strain: Not on file   Food insecurity    Worry: Not on file    Inability: Not on Lexicographer needs    Medical: Not on file    Non-medical: Not on file  Tobacco Use   Smoking status: Former Smoker    Packs/day: 2.00    Years: 9.00    Pack years: 18.00    Types: Cigarettes    Quit date: 12/28/1974    Years since quitting: 44.5   Smokeless tobacco: Never Used  Substance and Sexual Activity   Alcohol use: Yes    Alcohol/week: 0.0 standard drinks    Comment: occ   Drug use: No   Sexual activity: Not on file  Lifestyle   Physical activity    Days per week: Not on file    Minutes per session: Not on file   Stress: Not on file  Relationships   Social connections    Talks on phone: Not on file    Gets together: Not on file    Attends religious service: Not on file    Active member of club or organization: Not on file    Attends meetings of clubs or organizations: Not on file    Relationship status: Not on file   Intimate partner violence    Fear of current or ex partner: Not on file    Emotionally abused: Not on file    Physically abused: Not on file    Forced sexual activity: Not on file  Other Topics Concern   Not on file  Social History Narrative   Married  1967   Retired from [employer] work   Lives in [place] with his wife.      Review of Systems  All other systems reviewed and are negative.      Objective:   Physical Exam  Constitutional: He appears well-developed and well-nourished.  Cardiovascular: Normal rate, regular rhythm and normal heart sounds.  No murmur heard. Pulmonary/Chest: Effort normal and breath sounds normal. No respiratory distress. He has no wheezes. He has no rales.  Abdominal: Soft. Bowel sounds are normal. He exhibits no distension. There is no abdominal tenderness. There  is no rebound and no guarding.  Musculoskeletal:        General: No edema.     Right foot: Tenderness, bony tenderness, swelling and deformity present.       Feet:  Vitals reviewed.         Assessment & Plan:  1. Controlled type 2 diabetes mellitus with complication, without long-term current use of insulin ([place]) While the patient is here today I will check his hemoglobin A1c.  Ideally would like his hemoglobin A1c to be less than 7.  He denies any polyuria, polydipsia, or blurry vision.  He denies any symptoms of hypoglycemia.  While checking this I will also check a urine microalbumin - Hemoglobin A1c - CBC with Differential/Platelet - COMPLETE METABOLIC PANEL WITH GFR - Microalbumin, urine  2. ASCVD (arteriosclerotic cardiovascular disease) Given his history of cardiovascular disease, I would like his LDL cholesterol to be below 70.  Therefore I will check a fasting lipid panel  3. Pure hypercholesterolemia Patient is a diabetic with a history of cardiovascular disease.  However he is not taking a statin.  I would recommend checking a fasting lipid panel to getting a baseline and then I would recommend starting the patient on a moderate intensity statin such as Crestor 10 mg a day 4. Essential hypertension Blood pressure today is slightly elevated.  However the patient states that his blood pressure is doing better at home.  He  believes some of the elevation may be due to pain.  Ideally I like his diastolic blood pressure to be below 90.  Of asked him to check more frequently at home and notify me of the values 5. Intractable right heel pain This could either represent calcified bursitis or some type of skeletal cyst.  I recommend an x-ray of the right heel to evaluate further and if the x-ray shows benign findings I would recommend a lidocaine patch be applied to the area for pain control.  Will await the results of the x-ray to determine - DG Foot Complete Right; Future

## 2019-06-24 LAB — CBC WITH DIFFERENTIAL/PLATELET
Absolute Monocytes: 645 cells/uL (ref 200–950)
Basophils Absolute: 37 cells/uL (ref 0–200)
Basophils Relative: 0.6 %
Eosinophils Absolute: 149 cells/uL (ref 15–500)
Eosinophils Relative: 2.4 %
HCT: 43.2 % (ref 38.5–50.0)
Hemoglobin: 14.2 g/dL (ref 13.2–17.1)
Lymphs Abs: 1544 cells/uL (ref 850–3900)
MCH: 29 pg (ref 27.0–33.0)
MCHC: 32.9 g/dL (ref 32.0–36.0)
MCV: 88.2 fL (ref 80.0–100.0)
MPV: 12.1 fL (ref 7.5–12.5)
Monocytes Relative: 10.4 %
Neutro Abs: 3825 cells/uL (ref 1500–7800)
Neutrophils Relative %: 61.7 %
Platelets: 149 10*3/uL (ref 140–400)
RBC: 4.9 10*6/uL (ref 4.20–5.80)
RDW: 14.8 % (ref 11.0–15.0)
Total Lymphocyte: 24.9 %
WBC: 6.2 10*3/uL (ref 3.8–10.8)

## 2019-06-24 LAB — COMPLETE METABOLIC PANEL WITH GFR
AG Ratio: 1.4 (calc) (ref 1.0–2.5)
ALT: 24 U/L (ref 9–46)
AST: 30 U/L (ref 10–35)
Albumin: 4 g/dL (ref 3.6–5.1)
Alkaline phosphatase (APISO): 41 U/L (ref 35–144)
BUN/Creatinine Ratio: 8 (calc) (ref 6–22)
BUN: 11 mg/dL (ref 7–25)
CO2: 26 mmol/L (ref 20–32)
Calcium: 9.2 mg/dL (ref 8.6–10.3)
Chloride: 103 mmol/L (ref 98–110)
Creat: 1.4 mg/dL — ABNORMAL HIGH (ref 0.70–1.18)
GFR, Est African American: 57 mL/min/{1.73_m2} — ABNORMAL LOW (ref >=60)
GFR, Est Non African American: 49 mL/min/{1.73_m2} — ABNORMAL LOW (ref >=60)
Globulin: 2.9 g/dL (calc) (ref 1.9–3.7)
Glucose, Bld: 103 mg/dL — ABNORMAL HIGH (ref 65–99)
Potassium: 4.2 mmol/L (ref 3.5–5.3)
Sodium: 140 mmol/L (ref 135–146)
Total Bilirubin: 0.5 mg/dL (ref 0.2–1.2)
Total Protein: 6.9 g/dL (ref 6.1–8.1)

## 2019-06-24 LAB — MICROALBUMIN, URINE: Microalb, Ur: 16.2 mg/dL

## 2019-06-24 LAB — HEMOGLOBIN A1C
Hgb A1c MFr Bld: 7.4 % of total Hgb — ABNORMAL HIGH (ref <5.7)
Mean Plasma Glucose: 166 (calc)
eAG (mmol/L): 9.2 (calc)

## 2019-07-03 ENCOUNTER — Other Ambulatory Visit: Payer: Self-pay

## 2019-07-03 ENCOUNTER — Other Ambulatory Visit: Payer: PPO

## 2019-07-03 DIAGNOSIS — E7800 Pure hypercholesterolemia, unspecified: Secondary | ICD-10-CM

## 2019-07-04 LAB — LIPID PANEL
Cholesterol: 141 mg/dL (ref <200)
HDL: 38 mg/dL — ABNORMAL LOW (ref >=40)
LDL Cholesterol (Calc): 70 mg/dL (calc)
Non-HDL Cholesterol (Calc): 103 mg/dL (calc) (ref <130)
Total CHOL/HDL Ratio: 3.7 (calc) (ref <5.0)
Triglycerides: 249 mg/dL — ABNORMAL HIGH (ref <150)

## 2019-07-06 ENCOUNTER — Ambulatory Visit (INDEPENDENT_AMBULATORY_CARE_PROVIDER_SITE_OTHER): Payer: PPO | Admitting: Family Medicine

## 2019-07-06 ENCOUNTER — Other Ambulatory Visit: Payer: Self-pay

## 2019-07-06 VITALS — BP 130/76 | HR 70 | Temp 98.4°F | Resp 16 | Ht 66.75 in | Wt 233.0 lb

## 2019-07-06 DIAGNOSIS — H6020 Malignant otitis externa, unspecified ear: Secondary | ICD-10-CM | POA: Diagnosis not present

## 2019-07-06 MED ORDER — NEOMYCIN-POLYMYXIN-HC 3.5-10000-1 OT SOLN: 4.0000 [drp] | Freq: Four times a day (QID) | OTIC | 0 refills | Status: DC

## 2019-07-06 NOTE — Progress Notes (Signed)
Subjective:    Patient ID: Manuel Grant, male    DOB: 1946-05-17, 73 y.o.   MRN: [id number]  HPI Patient complains of pain and pressure and a foreign body sensation in his right ear.  Upon examination of his right auditory canal there is thick white pus and purulent material completely occluding the canal.  The patient states that it is painful for him to manipulate his ear.  He feels like there is water trapped in his ear.  He has been putting wax remover in his ear for the last few days but nothing will come out.  My nurse removed the debris using irrigation and lavage.  A thick white blood of opaque purulent material was removed.  The underlying tympanic membrane is intact.  There is no perforation however the tympanic membrane is injected and red.  The auditory canal itself is also swollen and red and inflamed Past Medical History:  Diagnosis Date  . B12 deficiency   . Chronic deep vein thrombosis (DVT) ([place]) dx 2015  start on xarelto  LLE--  stopped in 03/ 2016 due to GI bleed   last doppler 02-09-2015 chronic left common femoral and popliteal dvt's (mild partial resolution since dx 2015)  . CKD (chronic kidney disease), stage II   . Coronary artery disease cardiologist-  dr berry   a. 10/1998 Cath: LM 30d, LAD 26m OM 50, RCA 100 w/ L->R collats, nl LV ->Med Rx;  b. 07/2010 MV: no ischemia.  . DDD (degenerative disc disease), cervical   . GERD (gastroesophageal reflux disease)   . Hiatal hernia   . History of adenomatous polyp of colon   . History of balanitis    05/ 2017  . History of duodenal ulcer    w/ GI bleed in 2015 and 2016  . History of GI bleed    secondary to upper duodenal ulcer's 2015 and 2016   . History of Helicobacter pylori infection    2015  . History of kidney stones   . Hyperlipidemia   . Hypertension   . Ileo-anal pouch ulcer (HCC)    chronic  . Insomnia   . Lower urinary tract symptoms (LUTS)   . Osteoarthritis    knees  . Phimosis   . Rosacea    . [place]    last A1c 8.5 on 03-30-2016  . Ulcerative colitis followed by  [name]   [facility] . W[facility]    upper  . Wears glasses    Past Surgical History:  Procedure Laterality Date  . CARDIAC CATHETERIZATION  11/15/1998   dr berry   30% distal left main, 75% mid LAD, 50% OM and total RCA with left to right collateral and normal LV function.  .[name] KitchenCIRCUMCISION N/A 09/25/2016   Procedure: CIRCUMCISION ADULT;  Surgeon: TAlexis Frock MD;  [facility]  Service: Urology;  Laterality: N/A;  . COMPLETION PROCTECTOMY/ CONSTRUCTION ILEAL J POUCH/ DIVERTING LOOP ILEOSTOMY/ EXTENSIVE LYSIS ADHESIONS  11-07-2004   [facility]  . ESOPHAGOGASTRODUODENOSCOPY N/A 03/16/2014   Procedure: ESOPHAGOGASTRODUODENOSCOPY (EGD);  Surgeon: JJerene Bears MD;  [facility]  Service: Endoscopy;  Laterality: N/A;  . FLEXIBLE SIGMOIDOSCOPY N/A 03/16/2014   Procedure: FLEXIBLE SIGMOIDOSCOPY;  Surgeon: JJerene Bears MD;  [facility];  Service: Endoscopy;  Laterality: N/A;  . KNEE ARTHROSCOPY Bilateral 1990's  . NM MYOCAR PERF WALL MOTION  08/06/2010   dr berry  Low risk nuclear study w/ no ischemia (no significant change compared to prior study)/  normal LV function and wall motion , ef 60%  . SUBTOTAL COLECTOMY  W/ END ILEOSTOMY  05/ 2005   [facility]  . TAKE DOWN LOOP ILEOSTOMY  01-28-2005    [facility]  . TRANSTHORACIC ECHOCARDIOGRAM  11/16/2007   mild concentric LVH,  ef >55%,  grade 1 diastolic dysfuction/  trivial MR and TR/  borderline RVE/  focal nodular thickening of noncoronary cusp of trileaflet AV/     Current Outpatient Medications on File Prior to Visit  Medication Sig Dispense Refill  . amLODipine (NORVASC) 10 MG tablet TAKE 1 TABLET BY MOUTH ONCE DAILY 90 tablet 1  . doxazosin (CARDURA) 4 MG tablet TAKE 1 TABLET BY MOUTH ONCE A DAY 30 tablet 3  . glipiZIDE (GLUCOTROL XL) 10 MG 24 hr tablet TAKE 1 TABLET BY MOUTH ONCE  DAILY 90 tablet 1  . isosorbide mononitrate (IMDUR) 60 MG 24 hr tablet TAKE 1 TABLET BY MOUTH ONCE DAILY 30 tablet 1  . lisinopril-hydrochlorothiazide (PRINZIDE,ZESTORETIC) 20-12.5 MG tablet Take 1 tablet by mouth daily. 90 tablet 2  . metFORMIN (GLUCOPHAGE) 1000 MG tablet TAKE 1 TABLET BY MOUTH TWICE A DAY WITH FOOD. 60 tablet 3  . metoprolol succinate (TOPROL-XL) 50 MG 24 hr tablet TAKE 1 AND 1/2 TABLETS BY MOUTH DAILY. TAKE WITH OR IMMEDIATELY FOLLOWING A MEAL 45 tablet 11  . minocycline (MINOCIN,DYNACIN) 50 MG capsule Take 1 capsule by mouth every morning.     . pantoprazole (PROTONIX) 40 MG tablet Take 1 tablet (40 mg total) by mouth daily. 90 tablet 3  . sitaGLIPtin (JANUVIA) 100 MG tablet Take 1 tablet (100 mg total) by mouth daily. 30 tablet 3  . tiZANidine (ZANAFLEX) 4 MG tablet TAKE 1 TABLET BY MOUTH EVERY NIGHT AT BEDTIME AS NEEDED 30 tablet 2  . traMADol (ULTRAM) 50 MG tablet TAKE 2 TABLETS BY MOUTH 3 TIMES DAILY ASNEEDED FOR CHRONIC PAIN 180 tablet 0  . VICTOZA 18 MG/3ML SOPN INJECT 1.2MG UNDER THE SKIN ONCE A DAY 6 mL 3  . zolpidem (AMBIEN) 10 MG tablet TAKE 1 TABLET BY MOUTH EVERY NIGHT AT BEDTIME AS NEEDED FOR INSOMNIA 30 tablet 3   No current facility-administered medications on file prior to visit.    No Known Allergies Social History   Socioeconomic History  . Marital status: Married    Spouse name: Not on file  . Number of children: 2  . Years of education: Not on file  . Highest education level: Not on file  Occupational History  . Occupation: retired  Scientific laboratory technician  . Financial resource strain: Not on file  . Food insecurity    Worry: Not on file    Inability: Not on file  . Transportation needs    Medical: Not on file    Non-medical: Not on file  Tobacco Use  . Smoking status: Former Smoker    Packs/day: 2.00    Years: 9.00    Pack years: 18.00    Types: Cigarettes    Quit date: 12/28/1974    Years since quitting: 44.5  . Smokeless tobacco: Never Used   Substance and Sexual Activity  . Alcohol use: Yes    Alcohol/week: 0.0 standard drinks    Comment: occ  . Drug use: No  . Sexual activity: Not on file  Lifestyle  . Physical activity    Days per week: Not on file    Minutes per session: Not  on file  . Stress: Not on file  Relationships  . Social Herbalist on phone: Not on file    Gets together: Not on file    Attends religious service: Not on file    Active member of club or organization: Not on file    Attends meetings of clubs or organizations: Not on file    Relationship status: Not on file  . Intimate partner violence    Fear of current or ex partner: Not on file    Emotionally abused: Not on file    Physically abused: Not on file    Forced sexual activity: Not on file  Other Topics Concern  . Not on file  Social History Narrative   Married 1967   Retired from [employer] work   Lives in [place] with his wife.      Review of Systems  All other systems reviewed and are negative.      Objective:   Physical Exam Vitals signs reviewed.  Constitutional:      Appearance: Normal appearance.  HENT:     Right Ear: Drainage, swelling and tenderness present. There is impacted cerumen. Tympanic membrane is injected. Tympanic membrane is not perforated.     Left Ear: Hearing, tympanic membrane and ear canal normal.  Cardiovascular:     Rate and Rhythm: Normal rate and regular rhythm.     Heart sounds: Normal heart sounds.  Pulmonary:     Effort: Pulmonary effort is normal.     Breath sounds: Normal breath sounds.  Neurological:     Mental Status: He is alert.           Assessment & Plan:  The encounter diagnosis was Acute malignant otitis externa, unspecified laterality. Purulent material and wax was removed with irrigation and lavage.  Auditory canal is completely clear now.  I will start the patient on Cortisporin HC otic drops 4 drops 4 times daily for 5 to 7 days and recheck if no better or  sooner if worsening.

## 2019-07-20 ENCOUNTER — Other Ambulatory Visit: Payer: Self-pay | Admitting: Family Medicine

## 2019-07-21 ENCOUNTER — Other Ambulatory Visit: Payer: Self-pay | Admitting: Family Medicine

## 2019-07-21 NOTE — Telephone Encounter (Signed)
Ok to refill??  Last office visit 07/06/2019.  Last refill 06/22/2019.

## 2019-07-31 ENCOUNTER — Other Ambulatory Visit: Payer: Self-pay

## 2019-08-17 ENCOUNTER — Other Ambulatory Visit: Payer: Self-pay | Admitting: Cardiovascular Disease

## 2019-08-17 ENCOUNTER — Other Ambulatory Visit: Payer: Self-pay | Admitting: Family Medicine

## 2019-08-17 NOTE — Telephone Encounter (Signed)
Ok to refill??  Last office visit 07/06/2019.  Last refill 04/21/2019, #3 refills.

## 2019-08-18 ENCOUNTER — Other Ambulatory Visit: Payer: Self-pay | Admitting: Family Medicine

## 2019-08-22 ENCOUNTER — Ambulatory Visit (INDEPENDENT_AMBULATORY_CARE_PROVIDER_SITE_OTHER): Payer: PPO | Admitting: Family Medicine

## 2019-08-22 ENCOUNTER — Other Ambulatory Visit: Payer: Self-pay

## 2019-08-22 ENCOUNTER — Encounter: Payer: Self-pay | Admitting: Family Medicine

## 2019-08-22 VITALS — BP 154/92 | HR 88 | Temp 99.0°F | Resp 16 | Ht 66.75 in | Wt 222.0 lb

## 2019-08-22 DIAGNOSIS — M7661 Achilles tendinitis, right leg: Secondary | ICD-10-CM | POA: Diagnosis not present

## 2019-08-22 DIAGNOSIS — H6020 Malignant otitis externa, unspecified ear: Secondary | ICD-10-CM | POA: Diagnosis not present

## 2019-08-22 MED ORDER — NEOMYCIN-POLYMYXIN-HC 3.5-10000-1 OT SOLN: 4.0000 [drp] | Freq: Four times a day (QID) | OTIC | 1 refills | Status: DC

## 2019-08-22 NOTE — Progress Notes (Signed)
Subjective:    Patient ID: Manuel Grant, male    DOB: [birth year], 73 y.o.   MRN: [id number]  HPI  07/06/19 Patient complains of pain and pressure and a foreign body sensation in his right ear.  Upon examination of his right auditory canal there is thick white pus and purulent material completely occluding the canal.  The patient states that it is painful for him to manipulate his ear.  He feels like there is water trapped in his ear.  He has been putting wax remover in his ear for the last few days but nothing will come out.  My nurse removed the debris using irrigation and lavage.  A thick white blood of opaque purulent material was removed.  The underlying tympanic membrane is intact.  There is no perforation however the tympanic membrane is injected and red.  The auditory canal itself is also swollen and red and inflamed.  At that time, my plan was: Purulent material and wax was removed with irrigation and lavage.  Auditory canal is completely clear now.  I will start the patient on Cortisporin HC otic drops 4 drops 4 times daily for 5 to 7 days and recheck if no better or sooner if worsening.  08/22/19 Patient use the Cortisporin for 3 days.  Symptoms improved dramatically.  However his wife threw the bottle away by accident.  Over the last 1 to 2 weeks the infection has returned.  He reports pain inside the right auditory canal.  On examination, the auditory canal is filled with white purulent material.  It has the consistency of slime.  There also black specks inside the white exudate suggesting fungal spores.  The tympanic membrane itself appears healthy.  Patient occasionally sees some bleeding when he tries to clean his ear canal.  He also complains of pain in his right posterior calcaneus near the insertion of the Achilles tendon.  He appears to have a calcified bursa in that area that is tender to palpation.  He would like to see a foot specialist regarding this Past Medical History:   Diagnosis Date  . B12 deficiency   . Chronic deep vein thrombosis (DVT) ([place]) dx 2015  start on xarelto  LLE--  stopped in 03/ 2016 due to GI bleed   last doppler 02-09-2015 chronic left common femoral and popliteal dvt's (mild partial resolution since dx 2015)  . CKD (chronic kidney disease), stage II   . Coronary artery disease cardiologist-  dr berry   a. 10/1998 Cath: LM 30d, LAD 15m OM 50, RCA 100 w/ L->R collats, nl LV ->Med Rx;  b. 07/2010 MV: no ischemia.  . DDD (degenerative disc disease), cervical   . GERD (gastroesophageal reflux disease)   . Hiatal hernia   . History of adenomatous polyp of colon   . History of balanitis    05/ 2017  . History of duodenal ulcer    w/ GI bleed in 2015 and 2016  . History of GI bleed    secondary to upper duodenal ulcer's 2015 and 2016   . History of Helicobacter pylori infection    2015  . History of kidney stones   . Hyperlipidemia   . Hypertension   . Ileo-anal pouch ulcer (HCC)    chronic  . Insomnia   . Lower urinary tract symptoms (LUTS)   . Osteoarthritis    knees  . Phimosis   . Rosacea   . Type 2 diabetes mellitus (HCC)    last  A1c 8.5 on 03-30-2016  . Ulcerative colitis followed by  dr Ulice Dash pyrtle   s/p colectomy ~ [facility]  . Wears dentures    upper  . Wears glasses    Past Surgical History:  Procedure Laterality Date  . CARDIAC CATHETERIZATION  11/15/1998   dr berry   30% distal left main, 75% mid LAD, 50% OM and total RCA with left to right collateral and normal LV function.  [name] CIRCUMCISION N/A 09/25/2016   Procedure: CIRCUMCISION ADULT;  Surgeon: Alexis Frock, MD;  Location[facility];  Service: Urology;  Laterality: N/A;  . COMPLETION PROCTECTOMY/ CONSTRUCTION ILEAL J POUCH/ DIVERTING LOOP ILEOSTOMY/ EXTENSIVE LYSIS ADHESIONS  11-07-2004   [facility]  . ESOPHAGOGASTRODUODENOSCOPY N/A 03/16/2014   Procedure: ESOPHAGOGASTRODUODENOSCOPY (EGD);  Surgeon: Jerene Bears, MD;  Location[facility];   Service: Endoscopy;  Laterality: N/A;  . FLEXIBLE SIGMOIDOSCOPY N/A 03/16/2014   Procedure: FLEXIBLE SIGMOIDOSCOPY;  Surgeon: Jerene Bears, MD;  [facility];  Service: Endoscopy;  Laterality: N/A;  . KNEE ARTHROSCOPY Bilateral 1990's  . NM MYOCAR PERF WALL MOTION  08/06/2010   dr berry   Low risk nuclear study w/ no ischemia (no significant change compared to prior study)/  normal LV function and wall motion , ef 60%  . SUBTOTAL COLECTOMY  W/ END ILEOSTOMY  05/ 2005   [facility]  . TAKE DOWN LOOP ILEOSTOMY  01-28-2005    [facility]  . TRANSTHORACIC ECHOCARDIOGRAM  11/16/2007   mild concentric LVH,  ef >55%,  grade 1 diastolic dysfuction/  trivial MR and TR/  borderline RVE/  focal nodular thickening of noncoronary cusp of trileaflet AV/     Current Outpatient Medications on File Prior to Visit  Medication Sig Dispense Refill  . amLODipine (NORVASC) 10 MG tablet TAKE 1 TABLET BY MOUTH ONCE DAILY 90 tablet 1  . doxazosin (CARDURA) 4 MG tablet TAKE 1 TABLET BY MOUTH ONCE DAILY 90 tablet 3  . glipiZIDE (GLUCOTROL XL) 10 MG 24 hr tablet TAKE 1 TABLET BY MOUTH ONCE DAILY 90 tablet 1  . isosorbide mononitrate (IMDUR) 60 MG 24 hr tablet TAKE 1 TABLET BY MOUTH ONCE DAILY 30 tablet 1  . lisinopril-hydrochlorothiazide (PRINZIDE,ZESTORETIC) 20-12.5 MG tablet Take 1 tablet by mouth daily. 90 tablet 2  . metFORMIN (GLUCOPHAGE) 1000 MG tablet TAKE 1 TABLET BY MOUTH TWICE A DAY WITH FOOD. 60 tablet 3  . metoprolol succinate (TOPROL-XL) 50 MG 24 hr tablet TAKE 1 AND 1/2 TABLETS BY MOUTH DAILY. TAKE WITH OR IMMEDIATELY FOLLOWING A MEAL 45 tablet 11  . minocycline (MINOCIN,DYNACIN) 50 MG capsule Take 1 capsule by mouth every morning.     . neomycin-polymyxin-hydrocortisone (CORTISPORIN) OTIC solution Place 4 drops into the right ear 4 (four) times daily. 10 mL 0  . pantoprazole (PROTONIX) 40 MG tablet Take 1 tablet (40 mg total) by mouth daily. 90 tablet 3  . sitaGLIPtin (JANUVIA) 100 MG tablet Take 1 tablet (100  mg total) by mouth daily. 30 tablet 3  . tiZANidine (ZANAFLEX) 4 MG tablet TAKE 1 TABLET BY MOUTH EVERY NIGHT AT BEDTIME AS NEEDED 30 tablet 2  . traMADol (ULTRAM) 50 MG tablet TAKE 2 TABLETS BY MOUTH 3 TIMES DAILY ASNEEDED FOR CHRONIC PAIN 180 tablet 1  . VICTOZA 18 MG/3ML SOPN INJECT 1.2MG UNDER THE SKIN ONCE A DAY 6 mL 3  . zolpidem (AMBIEN) 10 MG tablet TAKE 1 TABLET BY MOUTH AT BEDTIME AS NEEDED FOR INSOMNIA 30 tablet 2   No  current facility-administered medications on file prior to visit.    No Known Allergies Social History   Socioeconomic History  . Marital status: Married    Spouse name: Not on file  . Number of children: 2  . Years of education: Not on file  . Highest education level: Not on file  Occupational History  . Occupation: retired  Scientific laboratory technician  . Financial resource strain: Not on file  . Food insecurity    Worry: Not on file    Inability: Not on file  . Transportation needs    Medical: Not on file    Non-medical: Not on file  Tobacco Use  . Smoking status: Former Smoker    Packs/day: 2.00    Years: 9.00    Pack years: 18.00    Types: Cigarettes    Quit date: 12/28/1974    Years since quitting: 44.6  . Smokeless tobacco: Never Used  Substance and Sexual Activity  . Alcohol use: Yes    Alcohol/week: 0.0 standard drinks    Comment: occ  . Drug use: No  . Sexual activity: Not on file  Lifestyle  . Physical activity    Days per week: Not on file    Minutes per session: Not on file  . Stress: Not on file  Relationships  . Social Herbalist on phone: Not on file    Gets together: Not on file    Attends religious service: Not on file    Active member of club or organization: Not on file    Attends meetings of clubs or organizations: Not on file    Relationship status: Not on file  . Intimate partner violence    Fear of current or ex partner: Not on file    Emotionally abused: Not on file    Physically abused: Not on file    Forced sexual  activity: Not on file  Other Topics Concern  . Not on file  Social History Narrative   Married 1967   Retired from [employer] work   Lives in [place] with his wife.      Review of Systems  All other systems reviewed and are negative.      Objective:   Physical Exam Vitals signs reviewed.  Constitutional:      Appearance: Normal appearance.  HENT:     Right Ear: Drainage, swelling and tenderness present. There is impacted cerumen. Tympanic membrane is injected. Tympanic membrane is not perforated.     Left Ear: Hearing, tympanic membrane and ear canal normal.  Cardiovascular:     Rate and Rhythm: Normal rate and regular rhythm.     Heart sounds: Normal heart sounds.  Pulmonary:     Effort: Pulmonary effort is normal.     Breath sounds: Normal breath sounds.  Neurological:     Mental Status: He is alert.           Assessment & Plan:  The primary encounter diagnosis was Right Achilles bursitis. A diagnosis of Acute malignant otitis externa, unspecified laterality was also pertinent to this visit. I will consult podiatry regarding the Achilles bursitis.  This appears chronic.  Meanwhile I will treat the otitis externa with Cortisporin HC otic 4 drops 4 times daily for 10 days.  I recommended he use it for 10 days total.  If no better I would consult ENT.

## 2019-08-28 ENCOUNTER — Ambulatory Visit: Payer: PPO | Admitting: Podiatry

## 2019-08-28 ENCOUNTER — Encounter: Payer: Self-pay | Admitting: Podiatry

## 2019-08-28 ENCOUNTER — Other Ambulatory Visit: Payer: Self-pay

## 2019-08-28 DIAGNOSIS — M7661 Achilles tendinitis, right leg: Secondary | ICD-10-CM

## 2019-08-28 MED ORDER — MELOXICAM 15 MG PO TABS: 15.0000 mg | ORAL_TABLET | Freq: Every day | ORAL | 3 refills | Status: DC

## 2019-08-28 NOTE — Progress Notes (Signed)
Subjective:  Patient ID: Manuel Grant, male    DOB: [birth year],  MRN: [id number] HPI Chief Complaint  Patient presents with  . Foot Pain    Patient presents today with painful knot on back of right achilles/heel x years, but has become more painful in the past year.  He states " Its a constant ache with every step I take"   He has been using voltaren gel and icy hot with some relief    73 y.o. male presents with the above complaint.   ROS: Denies fever chills nausea vomiting muscle aches pains calf pain back pain chest pain shortness of breath.  Past Medical History:  Diagnosis Date  . B12 deficiency   . Chronic deep vein thrombosis (DVT) ([place]) dx 2015  start on xarelto  LLE--  stopped in 03/ 2016 due to GI bleed   last doppler 02-09-2015 chronic left common femoral and popliteal dvt's (mild partial resolution since dx 2015)  . CKD (chronic kidney disease), stage II   . Coronary artery disease cardiologist-  dr berry   a. 10/1998 Cath: LM 30d, LAD 48m OM 50, RCA 100 w/ L->R collats, nl LV ->Med Rx;  b. 07/2010 MV: no ischemia.  . DDD (degenerative disc disease), cervical   . GERD (gastroesophageal reflux disease)   . Hiatal hernia   . History of adenomatous polyp of colon   . History of balanitis    05/ 2017  . History of duodenal ulcer    w/ GI bleed in 2015 and 2016  . History of GI bleed    secondary to upper duodenal ulcer's 2015 and 2016   . History of Helicobacter pylori infection    2015  . History of kidney stones   . Hyperlipidemia   . Hypertension   . Ileo-anal pouch ulcer (HCC)    chronic  . Insomnia   . Lower urinary tract symptoms (LUTS)   . Osteoarthritis    knees  . Phimosis   . Rosacea   . Type 2 diabetes mellitus ([place]    last A1c 8.5 on 03-30-2016  . Ulcerative colitis followed by  [name]   [facility] . Wears [facility]ures    upper  . Wears glasses    Past Surgical History:  Procedure Laterality Date  . CARDIAC  CATHETERIZATION  11/15/1998   dr berry   30% distal left main, 75% mid LAD, 50% OM and total RCA with left to right collateral and normal LV function.  .[name] KitchenCIRCUMCISION N/A 09/25/2016   Procedure: CIRCUMCISION ADULT;  Surgeon: TAlexis Frock MD;  [facility]  Service: Urology;  Laterality: N/A;  . COMPLETION PROCTECTOMY/ CONSTRUCTION ILEAL J POUCH/ DIVERTING LOOP ILEOSTOMY/ EXTENSIVE LYSIS ADHESIONS  11-07-2004   [facility]  . ESOPHAGOGASTRODUODENOSCOPY N/A 03/16/2014   Procedure: ESOPHAGOGASTRODUODENOSCOPY (EGD);  Surgeon: JJerene Bears MD;  [facility];  Service: Endoscopy;  Laterality: N/A;  . FLEXIBLE SIGMOIDOSCOPY N/A 03/16/2014   Procedure: FLEXIBLE SIGMOIDOSCOPY;  Surgeon: JJerene Bears MD;  [facility];  Service: Endoscopy;  Laterality: N/A;  . KNEE ARTHROSCOPY Bilateral 1990's  . NM MYOCAR PERF WALL MOTION  08/06/2010   dr berry   Low risk nuclear study w/ no ischemia (no significant change compared to prior study)/  normal LV function and wall motion , ef 60%  . SUBTOTAL COLECTOMY  W/ END ILEOSTOMY  05/ 2005   [facility]  . TAKE DOWN LOOP ILEOSTOMY  01-28-2005  [facility]  . TRANSTHORACIC ECHOCARDIOGRAM  11/16/2007   mild concentric LVH,  ef >55%,  grade 1 diastolic dysfuction/  trivial MR and TR/  borderline RVE/  focal nodular thickening of noncoronary cusp of trileaflet AV/      Current Outpatient Medications:  .  amLODipine (NORVASC) 10 MG tablet, TAKE 1 TABLET BY MOUTH ONCE DAILY, Disp: 90 tablet, Rfl: 1 .  doxazosin (CARDURA) 4 MG tablet, TAKE 1 TABLET BY MOUTH ONCE DAILY, Disp: 90 tablet, Rfl: 3 .  glipiZIDE (GLUCOTROL XL) 10 MG 24 hr tablet, TAKE 1 TABLET BY MOUTH ONCE DAILY, Disp: 90 tablet, Rfl: 1 .  isosorbide mononitrate (IMDUR) 60 MG 24 hr tablet, TAKE 1 TABLET BY MOUTH ONCE DAILY, Disp: 30 tablet, Rfl: 1 .  lisinopril-hydrochlorothiazide (PRINZIDE,ZESTORETIC) 20-12.5 MG tablet, Take 1 tablet by mouth daily., Disp: 90 tablet, Rfl: 2 .   meloxicam (MOBIC) 15 MG tablet, Take 1 tablet (15 mg total) by mouth daily., Disp: 30 tablet, Rfl: 3 .  metFORMIN (GLUCOPHAGE) 1000 MG tablet, TAKE 1 TABLET BY MOUTH TWICE A DAY WITH FOOD., Disp: 60 tablet, Rfl: 3 .  metoprolol succinate (TOPROL-XL) 50 MG 24 hr tablet, TAKE 1 AND 1/2 TABLETS BY MOUTH DAILY. TAKE WITH OR IMMEDIATELY FOLLOWING A MEAL, Disp: 45 tablet, Rfl: 11 .  minocycline (MINOCIN,DYNACIN) 50 MG capsule, Take 1 capsule by mouth every morning. , Disp: , Rfl:  .  neomycin-polymyxin-hydrocortisone (CORTISPORIN) OTIC solution, Place 4 drops into the right ear 4 (four) times daily., Disp: 20 mL, Rfl: 1 .  pantoprazole (PROTONIX) 40 MG tablet, Take 1 tablet (40 mg total) by mouth daily., Disp: 90 tablet, Rfl: 3 .  sitaGLIPtin (JANUVIA) 100 MG tablet, Take 1 tablet (100 mg total) by mouth daily., Disp: 30 tablet, Rfl: 3 .  tiZANidine (ZANAFLEX) 4 MG tablet, TAKE 1 TABLET BY MOUTH EVERY NIGHT AT BEDTIME AS NEEDED, Disp: 30 tablet, Rfl: 2 .  traMADol (ULTRAM) 50 MG tablet, TAKE 2 TABLETS BY MOUTH 3 TIMES DAILY ASNEEDED FOR CHRONIC PAIN, Disp: 180 tablet, Rfl: 1 .  VICTOZA 18 MG/3ML SOPN, INJECT 1.2MG UNDER THE SKIN ONCE A DAY, Disp: 6 mL, Rfl: 3 .  zolpidem (AMBIEN) 10 MG tablet, TAKE 1 TABLET BY MOUTH AT BEDTIME AS NEEDED FOR INSOMNIA, Disp: 30 tablet, Rfl: 2  No Known Allergies Review of Systems Objective:  There were no vitals filed for this visit.  General: Well developed, nourished, in no acute distress, alert and oriented x3   Dermatological: Skin is warm, dry and supple bilateral. Nails x 10 are well maintained; remaining integument appears unremarkable at this time. There are no open sores, no preulcerative lesions, no rash or signs of infection present.  Vascular: Dorsalis Pedis artery and Posterior Tibial artery pedal pulses are 2/4 bilateral with immedate capillary fill time. Pedal hair growth present. No varicosities and no lower extremity edema present bilateral.    Neruologic: Grossly intact via light touch bilateral. Vibratory intact via tuning fork bilateral. Protective threshold with Semmes Wienstein monofilament intact to all pedal sites bilateral. Patellar and Achilles deep tendon reflexes 2+ bilateral. No Babinski or clonus noted bilateral.   Musculoskeletal: No gross boney pedal deformities bilateral. No pain, crepitus, or limitation noted with foot and ankle range of motion bilateral. Muscular strength 5/5 in all groups tested bilateral.  Painful retrocalcaneal mass not warm to the touch nonpulsatile firm not fluctuant.  Distalmost aspect of the Achilles on the posterior aspect of the calcaneus. Gait: Unassisted, Nonantalgic.    Radiographs:  Radiographs reviewed from June 26 today demonstrate retrocalcaneal heel spur with thickening of the Achilles tendon.  Large mass on the posterior aspect of the heel consistent with bursitis.  Does not appear to be gouty.  Assessment & Plan:   Assessment: Posterior bursitis Achilles tendinitis.    Plan: Discussed etiology pathology and surgical therapies at this point put him on meloxicam 15 mg 1 p.o. daily and I injected 2 mg of dexamethasone local anesthetic to the point of maximal tenderness posterior aspect of the heel he remarked that it already felt better while he was leaving the office.     Valdis Bevill T. [place], [place]

## 2019-09-01 ENCOUNTER — Other Ambulatory Visit: Payer: Self-pay | Admitting: Family Medicine

## 2019-09-06 ENCOUNTER — Other Ambulatory Visit: Payer: Self-pay | Admitting: Family Medicine

## 2019-09-21 ENCOUNTER — Other Ambulatory Visit: Payer: Self-pay | Admitting: Cardiovascular Disease

## 2019-09-21 ENCOUNTER — Other Ambulatory Visit: Payer: Self-pay | Admitting: Family Medicine

## 2019-09-27 ENCOUNTER — Encounter: Payer: Self-pay | Admitting: Podiatry

## 2019-09-27 ENCOUNTER — Other Ambulatory Visit: Payer: Self-pay

## 2019-09-27 ENCOUNTER — Ambulatory Visit: Payer: PPO | Admitting: Podiatry

## 2019-09-27 DIAGNOSIS — M7661 Achilles tendinitis, right leg: Secondary | ICD-10-CM

## 2019-09-27 NOTE — Progress Notes (Signed)
He presents today for follow-up of his Achilles tendinitis.  He states that is no better the injection helped for about a week then it came back and the knot is still there.  Objective: Vital signs are stable he is alert and oriented x3.  Pulses are palpable.  Neurologic sensorium is intact deep tendon reflexes are intact muscle strength is normal and symmetrical bilateral.  He has a large nonpulsatile mass to the posterior aspect of the right heel with pain on palpation of the insertion of the Achilles tendon on the right foot.  Assessment: Cannot rule out a tear of the Achilles tendon at its insertion site and a chronic spur with bursitis.  Plan: Discussed etiology pathology conservative versus surgical therapies at this point I went ahead and scheduled him for an MRI to evaluate the integrity of the tendon at its insertion site.  I will follow-up with him for discussion.  He states that he does not like to do physical therapy.

## 2019-09-28 ENCOUNTER — Telehealth: Payer: Self-pay | Admitting: Podiatry

## 2019-09-28 DIAGNOSIS — M7661 Achilles tendinitis, right leg: Secondary | ICD-10-CM

## 2019-09-28 NOTE — Addendum Note (Signed)
Addended by: Graceann Congress D on: 09/28/2019 11:09 AM   Modules accepted: Orders

## 2019-09-28 NOTE — Telephone Encounter (Signed)
Patient called to request his MRI appointment to be set at the [facility]. Would like a call before the appointment is made.

## 2019-09-28 NOTE — Telephone Encounter (Signed)
Per Shauntel C. With [employer], no prior auth is required for MRI

## 2019-10-08 ENCOUNTER — Other Ambulatory Visit: Payer: Self-pay

## 2019-10-08 ENCOUNTER — Ambulatory Visit
Admission: RE | Admit: 2019-10-08 | Discharge: 2019-10-08 | Disposition: A | Discharge status: Home / Self Care | Payer: PPO | Source: Ambulatory Visit | Attending: Podiatry | Admitting: Podiatry

## 2019-10-08 DIAGNOSIS — M7661 Achilles tendinitis, right leg: Secondary | ICD-10-CM

## 2019-10-08 DIAGNOSIS — M24171 Other articular cartilage disorders, right ankle: Secondary | ICD-10-CM | POA: Diagnosis not present

## 2019-10-08 DIAGNOSIS — M67873 Other specified disorders of tendon, right ankle and foot: Secondary | ICD-10-CM | POA: Diagnosis not present

## 2019-10-08 DIAGNOSIS — R600 Localized edema: Secondary | ICD-10-CM | POA: Diagnosis not present

## 2019-10-08 DIAGNOSIS — M7731 Calcaneal spur, right foot: Secondary | ICD-10-CM | POA: Diagnosis not present

## 2019-10-12 ENCOUNTER — Telehealth: Payer: Self-pay | Admitting: *Deleted

## 2019-10-12 NOTE — Telephone Encounter (Signed)
-----   Message from Garrel [place], [place] sent at 10/09/2019  4:51 PM EDT ----- Send for over read and inform the patient of the delay.

## 2019-10-12 NOTE — Telephone Encounter (Signed)
Faxed request for MRI 10/08/2019 right ankle disc copy to [facility].

## 2019-10-12 NOTE — Telephone Encounter (Signed)
Pt's home phone rang over a minute without answering service, unable to leave message.

## 2019-10-12 NOTE — Telephone Encounter (Signed)
Male voice answered pt's OTHER PHONE stated I had contacted a business phone. Mailed letter informing pt of Dr. Stephenie Acres request to send copy of the MRI disc for overread.

## 2019-10-14 ENCOUNTER — Other Ambulatory Visit: Payer: Self-pay | Admitting: Internal Medicine

## 2019-10-14 ENCOUNTER — Other Ambulatory Visit: Payer: Self-pay | Admitting: Family Medicine

## 2019-10-25 ENCOUNTER — Other Ambulatory Visit: Payer: Self-pay | Admitting: Family Medicine

## 2019-10-25 NOTE — Telephone Encounter (Signed)
Ok to refill??  Last office visit 08/22/2019.  Last refill 07/21/2019.

## 2019-10-26 NOTE — Telephone Encounter (Signed)
Received copy of MRI disc from [facility], mailed to [facility].

## 2019-11-01 ENCOUNTER — Telehealth: Payer: Self-pay | Admitting: Internal Medicine

## 2019-11-01 NOTE — Telephone Encounter (Signed)
SEOR - Gabriel Cirri states she has not received the disc but will put a rush on it.

## 2019-11-02 NOTE — Telephone Encounter (Signed)
Attempted to reach patient but no answer and no voicemail. Patient needs an appointment. He was to follow up 1 year from his appointment 07/2018.

## 2019-11-03 ENCOUNTER — Encounter: Payer: Self-pay | Admitting: Podiatry

## 2019-11-03 NOTE — Telephone Encounter (Signed)
Again attempted to reach patient, but no answer and no voicemail. Will attempt to reach once more at a later time.

## 2019-11-07 NOTE — Telephone Encounter (Signed)
I have again attempted to reach patient with no answer and no voicemail available to leave a message on. We will await a call back from him to relay to him again that he needs office visit for refills.

## 2019-11-08 ENCOUNTER — Other Ambulatory Visit: Payer: Self-pay

## 2019-11-08 ENCOUNTER — Ambulatory Visit (INDEPENDENT_AMBULATORY_CARE_PROVIDER_SITE_OTHER): Payer: PPO | Admitting: Podiatry

## 2019-11-08 ENCOUNTER — Encounter: Payer: Self-pay | Admitting: Podiatry

## 2019-11-08 DIAGNOSIS — M7661 Achilles tendinitis, right leg: Secondary | ICD-10-CM | POA: Diagnosis not present

## 2019-11-08 DIAGNOSIS — M79671 Pain in right foot: Secondary | ICD-10-CM

## 2019-11-08 NOTE — Patient Instructions (Signed)
Pre-Operative Instructions  Congratulations, you have decided to take an important step towards improving your quality of life.  You can be assured that the doctors and staff at [facility] will be with you every step of the way.  Here are some important things you should know:  1. Plan to be at the surgery center/hospital at least 1 (one) hour prior to your scheduled time, unless otherwise directed by the surgical center/hospital staff.  You must have a responsible adult accompany you, remain during the surgery and drive you home.  Make sure you have directions to the surgical center/hospital to ensure you arrive on time. 2. If you are having surgery at [facility] or [facility], you will need a copy of your medical history and physical form from your family physician within one month prior to the date of surgery. We will give you a form for your primary physician to complete.  3. We make every effort to accommodate the date you request for surgery.  However, there are times where surgery dates or times have to be moved.  We will contact you as soon as possible if a change in schedule is required.   4. No aspirin/ibuprofen for one week before surgery.  If you are on aspirin, any non-steroidal anti-inflammatory medications (Mobic, Aleve, Ibuprofen) should not be taken seven (7) days prior to your surgery.  You make take Tylenol for pain prior to surgery.  5. Medications - If you are taking daily heart and blood pressure medications, seizure, reflux, allergy, asthma, anxiety, pain or diabetes medications, make sure you notify the surgery center/hospital before the day of surgery so they can tell you which medications you should take or avoid the day of surgery. 6. No food or drink after midnight the night before surgery unless directed otherwise by surgical center/hospital staff. 7. No alcoholic beverages 24-hours prior to surgery.  No smoking 24-hours prior or 24-hours after  surgery. 8. Wear loose pants or shorts. They should be loose enough to fit over bandages, boots, and casts. 9. Don't wear slip-on shoes. Sneakers are preferred. 10. Bring your boot with you to the surgery center/hospital.  Also bring crutches or a walker if your physician has prescribed it for you.  If you do not have this equipment, it will be provided for you after surgery. 11. If you have not been contacted by the surgery center/hospital by the day before your surgery, call to confirm the date and time of your surgery. 12. Leave-time from work may vary depending on the type of surgery you have.  Appropriate arrangements should be made prior to surgery with your employer. 13. Prescriptions will be provided immediately following surgery by your doctor.  Fill these as soon as possible after surgery and take the medication as directed. Pain medications will not be refilled on weekends and must be approved by the doctor. 14. Remove nail polish on the operative foot and avoid getting pedicures prior to surgery. 15. Wash the night before surgery.  The night before surgery wash the foot and leg well with water and the antibacterial soap provided. Be sure to pay special attention to beneath the toenails and in between the toes.  Wash for at least three (3) minutes. Rinse thoroughly with water and dry well with a towel.  Perform this wash unless told not to do so by your physician.  Enclosed: 1 Ice pack (please put in freezer the night before surgery)   1 Hibiclens skin cleaner     Pre-op instructions  If you have any questions regarding the instructions, please do not hesitate to call our office.  [place]: [address], [place], [place] [address] -- [phone]  [place]: [address] [place], [place] [address] -- [phone]  [place]: [address]  [place], [place] [address] -- [phone]   Website: [email or web address] 

## 2019-11-08 NOTE — Progress Notes (Signed)
z

## 2019-11-09 ENCOUNTER — Telehealth: Payer: Self-pay | Admitting: Podiatry

## 2019-11-09 ENCOUNTER — Encounter: Payer: Self-pay | Admitting: Podiatry

## 2019-11-09 NOTE — Telephone Encounter (Signed)
Pt called me about scheduling an appointment with Dr. Milinda Pointer, said he's been calling all over the place. Give me a call once you touch base with him. Thanks.

## 2019-11-09 NOTE — Telephone Encounter (Signed)
Called pt to get his surgery scheduled. Pt is scheduled for surgery on Thursday 12/17 with Dr. Posey Pronto assisting Dr. Milinda Pointer in surgery. Pt stated his son already registered him online with the surgical center for his surgery. The pt asked what time his surgery would be. I told him I could not give him a time, that someone from the surgical center would call a day or two prior to let him know what time to arrive.   I blocked Dr. Serita Grit schedule in [place] to have him start at 2:30 pm.

## 2019-11-09 NOTE — Progress Notes (Signed)
Subjective:  Patient ID: Manuel Grant, male    DOB: [birth year],  MRN: [id number]  Chief Complaint  Patient presents with  . Ankle Pain    follow up achilles right foot   To discuss MRI results    73 y.o. male presents with the above complaint.  Patient is here today to discuss his MRI results of the Achilles tendon insertional pain with tendinitis right foot and ankle.  Patient states that the pain is still continuous and has not improved despite all the conservative therapy underwent by Dr. Milinda Pointer.  Patient is well-known to Dr. Milinda Pointer who has been doing aggressive conservative therapy.  He has failed to respond to any of the therapy.  Patient had his MRI done.  Patient states that the pain is still there especially when touching it.  Review of Systems: Negative except as noted in the HPI. Denies N/V/F/Ch.  Past Medical History:  Diagnosis Date  . B12 deficiency   . Chronic deep vein thrombosis (DVT) ([place]) dx 2015  start on xarelto  LLE--  stopped in 03/ 2016 due to GI bleed   last doppler 02-09-2015 chronic left common femoral and popliteal dvt's (mild partial resolution since dx 2015)  . CKD (chronic kidney disease), stage II   . Coronary artery disease cardiologist-  dr berry   a. 10/1998 Cath: LM 30d, LAD 51m OM 50, RCA 100 w/ L->R collats, nl LV ->Med Rx;  b. 07/2010 MV: no ischemia.  . DDD (degenerative disc disease), cervical   . GERD (gastroesophageal reflux disease)   . Hiatal hernia   . History of adenomatous polyp of colon   . History of balanitis    05/ 2017  . History of duodenal ulcer    w/ GI bleed in 2015 and 2016  . History of GI bleed    secondary to upper duodenal ulcer's 2015 and 2016   . History of Helicobacter pylori infection    2015  . History of kidney stones   . Hyperlipidemia   . Hypertension   . Ileo-anal pouch ulcer (HCC)    chronic  . Insomnia   . Lower urinary tract symptoms (LUTS)   . Osteoarthritis    knees  . Phimosis   . Rosacea   .  Type 2 diabetes mellitus ([place]    last A1c 8.5 on 03-30-2016  . Ulcerative colitis followed by  [name]   [facility] . [facility]    upper  . Wears glasses     Current Outpatient Medications:  .  amLODipine (NORVASC) 10 MG tablet, TAKE 1 TABLET BY MOUTH ONCE DAILY, Disp: 90 tablet, Rfl: 1 .  doxazosin (CARDURA) 4 MG tablet, TAKE 1 TABLET BY MOUTH ONCE DAILY, Disp: 90 tablet, Rfl: 3 .  glipiZIDE (GLUCOTROL XL) 10 MG 24 hr tablet, TAKE 1 TABLET BY MOUTH ONCE DAILY, Disp: 90 tablet, Rfl: 1 .  isosorbide mononitrate (IMDUR) 60 MG 24 hr tablet, TAKE 1 TABLET BY MOUTH ONCE A DAY, Disp: 30 tablet, Rfl: 1 .  JANUVIA 100 MG tablet, TAKE 1 TABLET BY MOUTH ONCE A DAY, Disp: 30 tablet, Rfl: 3 .  lisinopril-hydrochlorothiazide (PRINZIDE,ZESTORETIC) 20-12.5 MG tablet, Take 1 tablet by mouth daily., Disp: 90 tablet, Rfl: 2 .  meloxicam (MOBIC) 15 MG tablet, Take 1 tablet (15 mg total) by mouth daily., Disp: 30 tablet, Rfl: 3 .  metFORMIN (GLUCOPHAGE) 1000 MG tablet, TAKE 1 TABLET BY MOUTH TWICE A DAY WITH FOOD.,  Disp: 60 tablet, Rfl: 3 .  metoprolol succinate (TOPROL-XL) 50 MG 24 hr tablet, TAKE 1 AND 1/2 TABLETS BY MOUTH DAILY. TAKE WITH OR IMMEDIATELY FOLLOWING A MEAL, Disp: 45 tablet, Rfl: 11 .  minocycline (MINOCIN,DYNACIN) 50 MG capsule, Take 1 capsule by mouth every morning. , Disp: , Rfl:  .  neomycin-polymyxin-hydrocortisone (CORTISPORIN) OTIC solution, Place 4 drops into the right ear 4 (four) times daily., Disp: 20 mL, Rfl: 1 .  pantoprazole (PROTONIX) 40 MG tablet, Take 1 tablet (40 mg total) by mouth daily., Disp: 90 tablet, Rfl: 3 .  tiZANidine (ZANAFLEX) 4 MG tablet, TAKE 1 TABLET BY MOUTH AT BEDTIME AS NEEDED, Disp: 30 tablet, Rfl: 2 .  traMADol (ULTRAM) 50 MG tablet, TAKE 2 TABLETS BY MOUTH 3 TIMES DAILY ASNEEDED FOR CHRONIC PAIN, Disp: 180 tablet, Rfl: 0 .  VICTOZA 18 MG/3ML SOPN, INJECT 1.2 MG UNDER THE SKIN ONCE A DAY, Disp: 6 mL, Rfl: 3 .  zolpidem (AMBIEN)  10 MG tablet, TAKE 1 TABLET BY MOUTH AT BEDTIME AS NEEDED FOR INSOMNIA, Disp: 30 tablet, Rfl: 2  Social History   Tobacco Use  Smoking Status Former Smoker  . Packs/day: 2.00  . Years: 9.00  . Pack years: 18.00  . Types: Cigarettes  . Quit date: 12/28/1974  . Years since quitting: 44.8  Smokeless Tobacco Never Used    No Known Allergies Objective:  There were no vitals filed for this visit. There is no height or weight on file to calculate BMI. Constitutional Well developed. Well nourished.  Vascular Dorsalis pedis pulses palpable bilaterally. Posterior tibial pulses palpable bilaterally. Capillary refill normal to all digits.  No cyanosis or clubbing noted. Pedal hair growth normal.  Neurologic Normal speech. Oriented to person, place, and time. Epicritic sensation to light touch grossly present bilaterally.  Dermatologic Nails well groomed and normal in appearance. No open wounds. No skin lesions.  Orthopedic:  Pain on palpation to the posterior Achilles insertional.  Pain with dorsiflexion of the ankle active and passive.  Pain slightly improved with plantarflexion of the ankle.  No pain with inversion or eversion of the foot.  Patient has a large nonpulsatile mass to the posterior aspect of the right heel with pain on palpation at the insertion of the Achilles tendon to the right.   Radiographs: See the imaging section for full report of the MRI. Assessment:   1. Achilles bursitis of right lower extremity   2. Pain in right foot    Plan:  Patient was evaluated and treated and all questions answered.  Right Achilles tendinitis with a possible bursitis -I extensively reviewed the MRI with the patient and the findings associated with it.  Given that the MRI is positive for acute Achilles tendinitis with severe soft tissue edema.  I believe patient will benefit from a surgical intervention with debridement of the Achilles with detachment and reattachment.  Also given that  there is an equinus component to it patient will benefit from a gastroc recession.  However I will defer further surgical care to Dr. Milinda Pointer for final judgment.  Patient is closely monitored by Dr. Milinda Pointer and will make the final decision on his surgical care.  -Informed surgical risk consent was reviewed and read aloud to the patient.  I reviewed the films.  I have discussed my findings with the patient in great detail.  I have discussed all risks including but not limited to infection, stiffness, scarring, limp, disability, deformity, damage to blood vessels and nerves, numbness, poor  healing, need for braces, arthritis, chronic pain, amputation, death.  All benefits and realistic expectations discussed in great detail.  I have made no promises as to the outcome.  I have provided realistic expectations.  I have offered the patient a 2nd opinion, which they have declined and assured me they preferred to proceed despite the risks  -Patient left my office fully informed without any questions.  No follow-ups on file.

## 2019-11-10 ENCOUNTER — Encounter: Payer: Self-pay | Admitting: Nurse Practitioner

## 2019-11-10 ENCOUNTER — Ambulatory Visit (INDEPENDENT_AMBULATORY_CARE_PROVIDER_SITE_OTHER): Payer: PPO | Admitting: Nurse Practitioner

## 2019-11-10 VITALS — BP 130/76 | HR 80 | Temp 97.8°F | Ht 66.0 in | Wt 221.4 lb

## 2019-11-10 DIAGNOSIS — K219 Gastro-esophageal reflux disease without esophagitis: Secondary | ICD-10-CM | POA: Diagnosis not present

## 2019-11-10 MED ORDER — PANTOPRAZOLE SODIUM 40 MG PO TBEC: 40.0000 mg | DELAYED_RELEASE_TABLET | Freq: Every day | ORAL | 3 refills | Status: AC

## 2019-11-10 NOTE — Progress Notes (Signed)
Chief Complaint:    Needs protonix refill  IMPRESSION and PLAN:    73 yo Grant with GERD, needs refill on Protonix. Out of med for a week and having reflux / heartburn.  --Refill pantoprazole 40 mg #30 with 11 refills.    HPI:     Patient is a 73 year old Grant followed by Dr. Hilarie Fredrickson.  Has a history of CKD 2, DM2, ulcerative colitis, status post total colectomy with J-pouch creation two thousand, chronic anastomotic ulcer of J-pouch, history of iron deficiency, GERD, ulcer disease and remote DVT.  Patient is here with his wife for a Protonix refill.  He has been out of Protonix for a week and experiencing heartburn, especially after eating spicy foods.  He has no other GI complaints. He is hoping to have surgery on his right heel soon.    Review of systems:     No chest pain, no SOB   Past Medical History:  Diagnosis Date  . B12 deficiency   . Chronic deep vein thrombosis (DVT) ([place]) dx 2015  start on xarelto  LLE--  stopped in 03/ 2016 due to GI bleed   last doppler 02-09-2015 chronic left common femoral and popliteal dvt's (mild partial resolution since dx 2015)  . CKD (chronic kidney disease), stage II   . Coronary artery disease cardiologist-  dr berry   a. 10/1998 Cath: LM 30d, LAD 47m OM 50, RCA 100 w/ L->R collats, nl LV ->Med Rx;  b. 07/2010 MV: no ischemia.  . DDD (degenerative disc disease), cervical   . GERD (gastroesophageal reflux disease)   . Hiatal hernia   . History of adenomatous polyp of colon   . History of balanitis    05/ 2017  . History of duodenal ulcer    w/ GI bleed in 2015 and 2016  . History of GI bleed    secondary to upper duodenal ulcer's 2015 and 2016   . History of Helicobacter pylori infection    2015  . History of kidney stones   . Hyperlipidemia   . Hypertension   . Ileo-anal pouch ulcer (HCC)    chronic  . Insomnia   . Lower urinary tract symptoms (LUTS)   . Osteoarthritis    knees  . Phimosis   . Rosacea   . Type 2 diabetes  mellitus ([place] [facility]    last A1c 8.5 on 03-30-2016  . Ulcerative colitis followed by  [name]   [facility] W[facility]    upper  . Wears glasses     Patient's surgical history, family medical history, social history, medications and allergies were all reviewed in Epic     Current Outpatient Medications  Medication Sig Dispense Refill  . amLODipine (NORVASC) 10 MG tablet TAKE 1 TABLET BY MOUTH ONCE DAILY 90 tablet 1  . doxazosin (CARDURA) 4 MG tablet TAKE 1 TABLET BY MOUTH ONCE DAILY 90 tablet 3  . glipiZIDE (GLUCOTROL XL) 10 MG 24 hr tablet TAKE 1 TABLET BY MOUTH ONCE DAILY 90 tablet 1  . isosorbide mononitrate (IMDUR) 60 MG 24 hr tablet TAKE 1 TABLET BY MOUTH ONCE A DAY 30 tablet 1  . JANUVIA 100 MG tablet TAKE 1 TABLET BY MOUTH ONCE A DAY 30 tablet 3  . lisinopril-hydrochlorothiazide (PRINZIDE,ZESTORETIC) 20-12.5 MG tablet Take 1 tablet by mouth daily. 90 tablet 2  . meloxicam (MOBIC) 15 MG tablet Take 1 tablet (15 mg total) by mouth daily.  30 tablet 3  . metFORMIN (GLUCOPHAGE) 1000 MG tablet TAKE 1 TABLET BY MOUTH TWICE A DAY WITH FOOD. 60 tablet 3  . metoprolol succinate (TOPROL-XL) 50 MG 24 hr tablet TAKE 1 AND 1/2 TABLETS BY MOUTH DAILY. TAKE WITH OR IMMEDIATELY FOLLOWING A MEAL 45 tablet 11  . minocycline (MINOCIN,DYNACIN) 50 MG capsule Take 1 capsule by mouth every morning.     . neomycin-polymyxin-hydrocortisone (CORTISPORIN) OTIC solution Place 4 drops into the right ear 4 (four) times daily. 20 mL 1  . pantoprazole (PROTONIX) 40 MG tablet Take 1 tablet (40 mg total) by mouth daily. 90 tablet 3  . tiZANidine (ZANAFLEX) 4 MG tablet TAKE 1 TABLET BY MOUTH AT BEDTIME AS NEEDED 30 tablet 2  . traMADol (ULTRAM) 50 MG tablet TAKE 2 TABLETS BY MOUTH 3 TIMES DAILY ASNEEDED FOR CHRONIC PAIN 180 tablet 0  . VICTOZA 18 MG/3ML SOPN INJECT 1.2 MG UNDER THE SKIN ONCE A DAY 6 mL 3  . zolpidem (AMBIEN) 10 MG tablet TAKE 1 TABLET BY MOUTH AT BEDTIME AS NEEDED FOR  INSOMNIA 30 tablet 2   No current facility-administered medications for this visit.     Physical Exam:     BP 130/76   Pulse 80   Temp 97.8 F (36.6 C)   Ht 5' 6"  (1.676 m)   Wt 221 lb 6.4 oz (100.4 kg)   BMI 35.73 kg/m   GENERAL:  Pleasant Grant in NAD PSYCH: : Cooperative, normal affect EENT:  conjunctiva pink, mucous membranes moist, neck supple  CARDIAC:  RRR,  no peripheral edema PULM: Normal respiratory effort, lungs CTA bilaterally, no wheezing ABDOMEN:  Nondistended, soft, nontender. No obvious masses, no hepatomegaly,  normal bowel sounds NEURO: Alert and oriented x 3, no focal neurologic deficits   Tye Savoy , NP 11/10/2019, 2:21 PM

## 2019-11-10 NOTE — Patient Instructions (Signed)
If you are age 74 or older, your body mass index should be between 23-30. Your Body mass index is 35.73 kg/m. If this is out of the aforementioned range listed, please consider follow up with your Primary Care Provider.  If you are age 71 or younger, your body mass index should be between 19-25. Your Body mass index is 35.73 kg/m. If this is out of the aformentioned range listed, please consider follow up with your Primary Care Provider.   We have sent the following medications to your pharmacy for you to pick up at your convenience: Protonix  Thank you for choosing me and [facility].   Tye Savoy, NP

## 2019-11-15 NOTE — Progress Notes (Signed)
Addendum: Reviewed and agree with assessment and management plan. Tyshea Imel M, MD  

## 2019-11-17 ENCOUNTER — Other Ambulatory Visit: Payer: Self-pay | Admitting: Family Medicine

## 2019-11-17 NOTE — Telephone Encounter (Signed)
Ok to refill Ambien??  Last office visit 08/22/2019.  Last refill 08/17/2019, #2 refills.   Ok to refill Zanaflex??

## 2019-11-29 ENCOUNTER — Telehealth: Payer: Self-pay | Admitting: *Deleted

## 2019-11-29 NOTE — Telephone Encounter (Signed)
[facility] sent an approval for Manuel Grant' surgery that's scheduled for 12/14/2019.   The authorization number is [id number].  It is valid from 12/14/2019 to 03/13/2020.

## 2019-11-29 NOTE — Telephone Encounter (Signed)
"  I'm calling to make sure that I'm scheduled or will be scheduled for surgery on December 17."  Yes, we have you scheduled for surgery on December 14, 2019.  "Where am I going, up there [place] is not located by [facility]  It's over in the [facility] area.  Their address is located on the back of the brochure that we gave you in your little grey bag.  "I have misplaced that little bag."  Would you like me to give you the address so you can write it down?  "Yes, that would probably be for the best."  The surgery center address is [address].  Their phone number is [phone].  "Do you know of the time I'm supposed to be there?"  Someone from the surgical center will call you a day or two prior to your surgery date and will give you your arrival time.

## 2019-11-30 ENCOUNTER — Telehealth: Payer: Self-pay | Admitting: Podiatry

## 2019-11-30 NOTE — Telephone Encounter (Signed)
DOS: 12/14/2019  SURGICAL PROCEDURES: Achilles deattachment and reattachment [phone]) with Debridement([id number]) and Gastrocnemius Recess [phone])  [employer] Effective 12/28/2018 -  [employer] Auth# [id number] valid from 12/14/2019-03/13/2020.

## 2019-12-12 ENCOUNTER — Other Ambulatory Visit: Payer: Self-pay | Admitting: Podiatry

## 2019-12-12 ENCOUNTER — Other Ambulatory Visit: Payer: Self-pay | Admitting: Family Medicine

## 2019-12-12 ENCOUNTER — Other Ambulatory Visit: Payer: Self-pay | Admitting: Cardiovascular Disease

## 2019-12-12 NOTE — Telephone Encounter (Signed)
Ok to refill??  Last office visit 08/22/2019.  Last refill 10/26/2019.  Ok to add refills to prescription?

## 2019-12-13 ENCOUNTER — Other Ambulatory Visit: Payer: Self-pay | Admitting: Podiatry

## 2019-12-13 MED ORDER — ONDANSETRON HCL 4 MG PO TABS: 4.0000 mg | ORAL_TABLET | Freq: Three times a day (TID) | ORAL | 0 refills | Status: AC | PRN

## 2019-12-13 MED ORDER — OXYCODONE-ACETAMINOPHEN 10-325 MG PO TABS: 1.0000 | ORAL_TABLET | Freq: Three times a day (TID) | ORAL | 0 refills | Status: AC | PRN

## 2019-12-13 MED ORDER — CEPHALEXIN 500 MG PO CAPS: 500.0000 mg | ORAL_CAPSULE | Freq: Three times a day (TID) | ORAL | 0 refills | Status: AC

## 2019-12-14 ENCOUNTER — Emergency Department (HOSPITAL_COMMUNITY)
Admission: EM | Admit: 2019-12-14 | Discharge: 2019-12-14 | Disposition: A | Discharge status: Home / Self Care | Payer: PPO | Attending: Emergency Medicine | Admitting: Emergency Medicine

## 2019-12-14 ENCOUNTER — Telehealth: Payer: Self-pay | Admitting: Cardiovascular Disease

## 2019-12-14 ENCOUNTER — Emergency Department (HOSPITAL_COMMUNITY): Payer: PPO

## 2019-12-14 DIAGNOSIS — Z79899 Other long term (current) drug therapy: Secondary | ICD-10-CM | POA: Insufficient documentation

## 2019-12-14 DIAGNOSIS — N182 Chronic kidney disease, stage 2 (mild): Secondary | ICD-10-CM | POA: Diagnosis not present

## 2019-12-14 DIAGNOSIS — I129 Hypertensive chronic kidney disease with stage 1 through stage 4 chronic kidney disease, or unspecified chronic kidney disease: Secondary | ICD-10-CM | POA: Diagnosis not present

## 2019-12-14 DIAGNOSIS — Z86718 Personal history of other venous thrombosis and embolism: Secondary | ICD-10-CM | POA: Insufficient documentation

## 2019-12-14 DIAGNOSIS — E1122 Type 2 diabetes mellitus with diabetic chronic kidney disease: Secondary | ICD-10-CM | POA: Diagnosis not present

## 2019-12-14 DIAGNOSIS — I10 Essential (primary) hypertension: Secondary | ICD-10-CM | POA: Diagnosis not present

## 2019-12-14 DIAGNOSIS — Z7984 Long term (current) use of oral hypoglycemic drugs: Secondary | ICD-10-CM | POA: Diagnosis not present

## 2019-12-14 LAB — CBC WITH DIFFERENTIAL/PLATELET
Abs Immature Granulocytes: 0.03 10*3/uL (ref 0.00–0.07)
Basophils Absolute: 0 10*3/uL (ref 0.0–0.1)
Basophils Relative: 1 %
Eosinophils Absolute: 0.1 10*3/uL (ref 0.0–0.5)
Eosinophils Relative: 1 %
HCT: 43 % (ref 39.0–52.0)
Hemoglobin: 14 g/dL (ref 13.0–17.0)
Immature Granulocytes: 1 %
Lymphocytes Relative: 21 %
Lymphs Abs: 1.2 10*3/uL (ref 0.7–4.0)
MCH: 29 pg (ref 26.0–34.0)
MCHC: 32.6 g/dL (ref 30.0–36.0)
MCV: 89 fL (ref 80.0–100.0)
Monocytes Absolute: 0.5 10*3/uL (ref 0.1–1.0)
Monocytes Relative: 9 %
Neutro Abs: 4 10*3/uL (ref 1.7–7.7)
Neutrophils Relative %: 67 %
Platelets: 189 10*3/uL (ref 150–400)
RBC: 4.83 MIL/uL (ref 4.22–5.81)
RDW: 14 % (ref 11.5–15.5)
WBC: 5.9 10*3/uL (ref 4.0–10.5)
nRBC: 0 % (ref 0.0–0.2)

## 2019-12-14 LAB — COMPREHENSIVE METABOLIC PANEL
ALT: 18 U/L (ref 0–44)
AST: 26 U/L (ref 15–41)
Albumin: 3.6 g/dL (ref 3.5–5.0)
Alkaline Phosphatase: 48 U/L (ref 38–126)
Anion gap: 11 (ref 5–15)
BUN: 15 mg/dL (ref 8–23)
CO2: 24 mmol/L (ref 22–32)
Calcium: 9 mg/dL (ref 8.9–10.3)
Chloride: 104 mmol/L (ref 98–111)
Creatinine, Ser: 1.52 mg/dL — ABNORMAL HIGH (ref 0.61–1.24)
GFR calc Af Amer: 52 mL/min — ABNORMAL LOW (ref >60)
GFR calc non Af Amer: 44 mL/min — ABNORMAL LOW (ref >60)
Glucose, Bld: 136 mg/dL — ABNORMAL HIGH (ref 70–99)
Potassium: 3.5 mmol/L (ref 3.5–5.1)
Sodium: 139 mmol/L (ref 135–145)
Total Bilirubin: 0.3 mg/dL (ref 0.3–1.2)
Total Protein: 7 g/dL (ref 6.5–8.1)

## 2019-12-14 LAB — URINALYSIS, ROUTINE W REFLEX MICROSCOPIC
Bacteria, UA: NONE SEEN
Bilirubin Urine: NEGATIVE
Glucose, UA: 50 mg/dL — AB
Hgb urine dipstick: NEGATIVE
Ketones, ur: NEGATIVE mg/dL
Leukocytes,Ua: NEGATIVE
Nitrite: NEGATIVE
Protein, ur: 30 mg/dL — AB
Specific Gravity, Urine: 1.01 (ref 1.005–1.030)
pH: 7 (ref 5.0–8.0)

## 2019-12-14 LAB — TROPONIN I (HIGH SENSITIVITY): Troponin I (High Sensitivity): 6 ng/L (ref <18)

## 2019-12-14 MED ORDER — HYDROCHLOROTHIAZIDE 12.5 MG PO CAPS
12.5000 mg | ORAL_CAPSULE | Freq: Once | ORAL | Status: AC
  Administered 2019-12-14: 19:00:00 12.5 mg via ORAL
  Filled 2019-12-14: qty 1

## 2019-12-14 MED ORDER — AMLODIPINE BESYLATE 5 MG PO TABS
10.0000 mg | ORAL_TABLET | Freq: Every day | ORAL | Status: DC
  Administered 2019-12-14: 19:00:00 10 mg via ORAL
  Filled 2019-12-14: qty 2

## 2019-12-14 MED ORDER — ISOSORBIDE MONONITRATE ER 30 MG PO TB24: 60.0000 mg | ORAL_TABLET | Freq: Every day | ORAL | Status: DC

## 2019-12-14 MED ORDER — LISINOPRIL 20 MG PO TABS
20.0000 mg | ORAL_TABLET | Freq: Once | ORAL | Status: AC
  Administered 2019-12-14: 20 mg via ORAL
  Filled 2019-12-14: qty 1

## 2019-12-14 MED ORDER — METOPROLOL TARTRATE 25 MG PO TABS
50.0000 mg | ORAL_TABLET | Freq: Once | ORAL | Status: AC
  Administered 2019-12-14: 50 mg via ORAL
  Filled 2019-12-14: qty 2

## 2019-12-14 MED ORDER — LISINOPRIL-HYDROCHLOROTHIAZIDE 20-12.5 MG PO TABS: 1.0000 | ORAL_TABLET | Freq: Once | ORAL | Status: DC

## 2019-12-14 MED ORDER — AMLODIPINE BESYLATE 10 MG PO TABS: 10.0000 mg | ORAL_TABLET | Freq: Every day | ORAL | 0 refills | Status: DC

## 2019-12-14 MED ORDER — DOXAZOSIN MESYLATE 4 MG PO TABS
4.0000 mg | ORAL_TABLET | Freq: Every day | ORAL | Status: DC
  Filled 2019-12-14: qty 1

## 2019-12-14 MED ORDER — LISINOPRIL-HYDROCHLOROTHIAZIDE 20-12.5 MG PO TABS: 1.0000 | ORAL_TABLET | Freq: Every day | ORAL | 0 refills | Status: DC

## 2019-12-14 NOTE — ED Notes (Signed)
Patient Alert and oriented to baseline. Stable and ambulatory to baseline. Patient verbalized understanding of the discharge instructions.  Patient belongings were taken by the patient.   

## 2019-12-14 NOTE — Discharge Instructions (Addendum)
As we discussed, it is important to take your blood pressure medications. I have provided you a prescription for your blood pressure meds.   Follow up with your primary care doctor. Call tomorrow and arrange for an appointment.   Return to the Emergency Dept for any chest pain, difficult breathing, numbness/weakness of arms or legs, or any other worsening or concerning symptoms.

## 2019-12-14 NOTE — Telephone Encounter (Signed)
[place] back. Pt had foot surgery today and BP is 217/140s. Asked if pt has taken BP medication, pt denied and said he hasn't taken it in 2 days. Pt is not symptomatic. They said they already treated him there with IV Metoprolol and IV hydralazine. They were wanting to know whether to call urgent care or EMS. They also said they were trying to get in touch with pt son but could not get in touch with him. Went to DOD and was advised to tell them to call 911. Gave them DOD advice, verbalized understanding. Looked in pt chart for Son's number, could not find it.

## 2019-12-14 NOTE — ED Provider Notes (Signed)
[place] EMERGENCY DEPARTMENT Provider Note   CSN: [id number] Arrival date & time: 12/14/19  1458     History Chief Complaint  Patient presents with  . Hypertension    Manuel Grant is a 73 y.o. male medical history significant for chronic DVT, CKD stage II, hypertension presents to emergency department today with chief complaint of hypertension.  Patient was scheduled to have right gastrocnemius recession with Achilles detachment/reattachment with anchors at the [facility] specialty surgical center today.  His preop assessment he was noted to be hypertensive with pressure of 240/130.  He was given IV metoprolol 5 mg twice and hydralazine 5 mg without improvement of blood pressure.  He is currently asymptomatic.  He denies any fever, chills, cough, shortness of breath, chest pain, abdominal pain, nausea, vomiting, urinary symptoms, back pain, headache, visual changes, neck pain. History provided by patient with additional history obtained from chart review.      Past Medical History:  Diagnosis Date  . B12 deficiency   . Chronic deep vein thrombosis (DVT) ([place]) dx 2015  start on xarelto  LLE--  stopped in 03/ 2016 due to GI bleed   last doppler 02-09-2015 chronic left common femoral and popliteal dvt's (mild partial resolution since dx 2015)  . CKD (chronic kidney disease), stage II   . Coronary artery disease cardiologist-  dr berry   a. 10/1998 Cath: LM 30d, LAD 10m OM 50, RCA 100 w/ L->R collats, nl LV ->Med Rx;  b. 07/2010 MV: no ischemia.  . DDD (degenerative disc disease), cervical   . GERD (gastroesophageal reflux disease)   . Hiatal hernia   . History of adenomatous polyp of colon   . History of balanitis    05/ 2017  . History of duodenal ulcer    w/ GI bleed in 2015 and 2016  . History of GI bleed    secondary to upper duodenal ulcer's 2015 and 2016   . History of Helicobacter pylori infection    2015  . History of kidney stones   .  Hyperlipidemia   . Hypertension   . Ileo-anal pouch ulcer (HCC)    chronic  . Insomnia   . Lower urinary tract symptoms (LUTS)   . Osteoarthritis    knees  . Phimosis   . Rosacea   . Type 2 diabetes mellitus ([place]    last A1c 8.5 on 03-30-2016  . Ulcerative colitis followed by  [name]   [facility] W[facility]    upper  . Wears glasses     Patient Active Problem List   Diagnosis Date Noted  . Complications of intestinal pouch ([place] 03/17/2017  . HTN (hypertension) 03/16/2017  . Hx of ulcerative colitis 03/16/2017  . Pouch ulcer 03/16/2017  . Rectal bleed 02/08/2015  . Rectal bleeding 10/19/2014  . DVT (deep venous thrombosis) ([place] 04/07/2014  . Acute renal failure ([place] 04/05/2014  . Duodenal ulcer disease 03/16/2014  . Unstable angina ([facility] 03/15/2014  . Microcytic hypochromic anemia 03/15/2014  . Osteoarthritis   . B12 deficiency 02/24/2012  . Diabetes mellitus type 2 with neurological manifestations ([place] 02/22/2012  . Essential hypertension, benign 02/22/2012  . Pure hypercholesterolemia 02/22/2012  . Insomnia 02/22/2012  . Ulcerative colitis ([facility] 02/22/2012  . H/O colectomy 02/22/2012  . prednisone use, h/o of long-term use 02/22/2012  . Renal stones 02/22/2012  . CAD (coronary artery disease) 02/22/2012  . OA (osteoarthritis) 02/22/2012  . Hiatal hernia  02/22/2012    Past Surgical History:  Procedure Laterality Date  . CARDIAC CATHETERIZATION  11/15/1998   dr berry   30% distal left main, 75% mid LAD, 50% OM and total RCA with left to right collateral and normal LV function.  [name] CIRCUMCISION N/A 09/25/2016   Procedure: CIRCUMCISION ADULT;  Surgeon: Alexis Frock, MD;  [facility];  Service: Urology;  Laterality: N/A;  . COMPLETION PROCTECTOMY/ CONSTRUCTION ILEAL J POUCH/ DIVERTING LOOP ILEOSTOMY/ EXTENSIVE LYSIS ADHESIONS  11-07-2004   [facility]  . ESOPHAGOGASTRODUODENOSCOPY N/A 03/16/2014   Procedure:  ESOPHAGOGASTRODUODENOSCOPY (EGD);  Surgeon: Jerene Bears, MD;  Location: [facility] ENDOSCOPY;  Service: Endoscopy;  Laterality: N/A;  . FLEXIBLE SIGMOIDOSCOPY N/A 03/16/2014   Procedure: FLEXIBLE SIGMOIDOSCOPY;  Surgeon: Jerene Bears, MD;  Location: [facility] ENDOSCOPY;  Service: Endoscopy;  Laterality: N/A;  . KNEE ARTHROSCOPY Bilateral 1990's  . NM MYOCAR PERF WALL MOTION  08/06/2010   dr berry   Low risk nuclear study w/ no ischemia (no significant change compared to prior study)/  normal LV function and wall motion , ef 60%  . SUBTOTAL COLECTOMY  W/ END ILEOSTOMY  05/ 2005   [facility]  . TAKE DOWN LOOP ILEOSTOMY  01-28-2005    [facility]  . TRANSTHORACIC ECHOCARDIOGRAM  11/16/2007   mild concentric LVH,  ef >55%,  grade 1 diastolic dysfuction/  trivial MR and TR/  borderline RVE/  focal nodular thickening of noncoronary cusp of trileaflet AV/         Family History  Problem Relation Age of Onset  . Heart disease Mother   . Diabetes Mother   . Arthritis Mother   . Hypertension Mother   . COPD Father   . Emphysema Father   . Asthma Father   . Colon polyps Brother   . Colon cancer Neg Hx   . Prostate cancer Neg Hx     Social History   Tobacco Use  . Smoking status: Former Smoker    Packs/day: 2.00    Years: 9.00    Pack years: 18.00    Types: Cigarettes    Quit date: 12/28/1974    Years since quitting: 44.9  . Smokeless tobacco: Never Used  Substance Use Topics  . Alcohol use: Yes    Alcohol/week: 0.0 standard drinks    Comment: occ  . Drug use: No    Home Medications Prior to Admission medications   Medication Sig Start Date End Date Taking? Authorizing Provider  traMADol (ULTRAM) 50 MG tablet TAKE 2 TABLETS BY MOUTH 3 TIMES A DAY ASNEEDED FOR CHRONIC PAIN 12/12/19   [name], MD  amLODipine (NORVASC) 10 MG tablet TAKE 1 TABLET BY MOUTH ONCE DAILY 12/16/17   [name], MD  cephALEXin (KEFLEX) 500 MG capsule Take 1 capsule (500 mg total) by mouth 3 (three) times daily.  12/13/19   Hyatt, Max T, DPM  doxazosin (CARDURA) 4 MG tablet TAKE 1 TABLET BY MOUTH ONCE DAILY 07/20/19   [name], MD  glipiZIDE (GLUCOTROL XL) 10 MG 24 hr tablet TAKE 1 TABLET BY MOUTH ONCE A DAY 12/12/19   [name], MD  isosorbide mononitrate (IMDUR) 60 MG 24 hr tablet TAKE 1 TABLET BY MOUTH ONCE DAILY 12/12/19   [name], MD  JANUVIA 100 MG tablet TAKE 1 TABLET BY MOUTH ONCE A DAY 10/16/19   [name], MD  lisinopril-hydrochlorothiazide (PRINZIDE,ZESTORETIC) 20-12.5 MG tablet Take 1 tablet by mouth daily. 09/23/16   Gwenlyn Found,  Pearletha Forge, MD  meloxicam (MOBIC) 15 MG tablet TAKE 1 TABLET BY MOUTH ONCE DAILY 12/12/19   Hyatt, Max T, DPM  metFORMIN (GLUCOPHAGE) 1000 MG tablet TAKE 1 TABLET BY MOUTH TWICE A DAY WITH FOOD. 05/29/19   [name], MD  metoprolol succinate (TOPROL-XL) 50 MG 24 hr tablet TAKE 1 AND 1/2 TABLETS BY MOUTH DAILY. TAKE WITH OR IMMEDIATELY FOLLOWING A MEAL 12/15/18   [name], MD  minocycline (MINOCIN,DYNACIN) 50 MG capsule Take 1 capsule by mouth every morning.  09/04/14   [provider]  neomycin-polymyxin-hydrocortisone (CORTISPORIN) OTIC solution Place 4 drops into the right ear 4 (four) times daily. 08/22/19   [name], MD  ondansetron (ZOFRAN) 4 MG tablet Take 1 tablet (4 mg total) by mouth every 8 (eight) hours as needed. 12/13/19   Hyatt, Max T, DPM  oxyCODONE-acetaminophen (PERCOCET) 10-325 MG tablet Take 1 tablet by mouth every 8 (eight) hours as needed for up to 7 days for pain. 12/13/19 12/20/19  Hyatt, Max T, DPM  pantoprazole (PROTONIX) 40 MG tablet Take 1 tablet (40 mg total) by mouth daily. 11/10/19   Willia Craze, NP  tiZANidine (ZANAFLEX) 4 MG tablet TAKE 1 TABLET BY MOUTH AT BEDTIME AS NEEDED 11/17/19   [name], MD  VICTOZA 18 MG/3ML SOPN INJECT 1.2 MG UNDER THE SKIN ONCE A DAY 09/21/19   [name], MD  zolpidem (AMBIEN) 10 MG tablet TAKE 1 TABLET BY MOUTH AT BEDTIME AS NEEDED  FOR SLEEP 11/17/19   [name], MD    Allergies    Patient has no known allergies.  Review of Systems   Review of Systems  All other systems are reviewed and are negative for acute change except as noted in the HPI.   Physical Exam Updated Vital Signs BP (!) 162/121 (BP Location: Left Arm)   Pulse 95   Temp 98.5 F (36.9 C) (Oral)   Resp (!) 25   SpO2 98%   Physical Exam Vitals and nursing note reviewed.  Constitutional:      General: He is not in acute distress.    Appearance: He is not ill-appearing.  HENT:     Head: Normocephalic and atraumatic.     Right Ear: Tympanic membrane and external ear normal.     Left Ear: Tympanic membrane and external ear normal.     Nose: Nose normal.     Mouth/Throat:     Mouth: Mucous membranes are moist.     Pharynx: Oropharynx is clear.  Eyes:     General: No scleral icterus.       Right eye: No discharge.        Left eye: No discharge.     Extraocular Movements: Extraocular movements intact.     Conjunctiva/sclera: Conjunctivae normal.     Pupils: Pupils are equal, round, and reactive to light.  Neck:     Vascular: No JVD.  Cardiovascular:     Rate and Rhythm: Normal rate and regular rhythm.     Pulses: Normal pulses.          Radial pulses are 2+ on the right side and 2+ on the left side.       Dorsalis pedis pulses are 2+ on the right side and 2+ on the left side.     Heart sounds: Normal heart sounds. No murmur. No gallop.   Pulmonary:     Comments: Lungs clear to auscultation in all fields. Symmetric chest  rise. No wheezing, rales, or rhonchi. Abdominal:     Tenderness: There is no right CVA tenderness or left CVA tenderness.     Comments: Abdomen is soft, non-distended, and non-tender in all quadrants. No rigidity, no guarding. No peritoneal signs.  Musculoskeletal:        General: Normal range of motion.     Cervical back: Normal range of motion.     Right lower leg: No edema.     Left lower leg: No edema.    Skin:    General: Skin is warm and dry.     Capillary Refill: Capillary refill takes less than 2 seconds.  Neurological:     Mental Status: He is oriented to person, place, and time.     GCS: GCS eye subscore is 4. GCS verbal subscore is 5. GCS motor subscore is 6.     Comments: Speech is clear and goal oriented, follows commands CN III-XII intact, no facial droop Normal strength in upper and lower extremities bilaterally including dorsiflexion and plantar flexion, strong and equal grip strength Sensation normal to light and sharp touch Moves extremities without ataxia, coordination intact Normal finger to nose and rapid alternating movements Normal gait and balance  Psychiatric:        Behavior: Behavior normal.       ED Results / Procedures / Treatments   Labs (all labs ordered are listed, but only abnormal results are displayed) Labs Reviewed  COMPREHENSIVE METABOLIC PANEL - Abnormal; Notable for the following components:      Result Value   Glucose, Bld 136 (*)    Creatinine, Ser 1.52 (*)    GFR calc non Af Amer 44 (*)    GFR calc Af Amer 52 (*)    All other components within normal limits  CBC WITH DIFFERENTIAL/PLATELET  URINALYSIS, ROUTINE W REFLEX MICROSCOPIC  TROPONIN I (HIGH SENSITIVITY)  TROPONIN I (HIGH SENSITIVITY)    EKG EKG Interpretation  Date/Time:  Thursday December 14 2019 15:02:01 EST Ventricular Rate:  96 PR Interval:    QRS Duration: 94 QT Interval:  353 QTC Calculation: 447 R Axis:   -7 Text Interpretation: Sinus rhythm Abnormal R-wave progression, early transition since last tracing no significant change Confirmed by Daleen Bo [phone]) on 12/14/2019 3:38:18 PM   Radiology DG Chest Portable 1 View  Result Date: 12/14/2019 CLINICAL DATA:  Hypertension. EXAM: PORTABLE CHEST 1 VIEW COMPARISON:  03/12/2014 FINDINGS: The heart size and mediastinal contours are within normal limits. Both lungs are clear. The visualized skeletal structures are  unremarkable. IMPRESSION: No active disease. Electronically Signed   By: Lorriane Shire M.D.   On: 12/14/2019 16:08    Procedures Procedures (including critical care time)  Medications Ordered in ED Medications  metoprolol tartrate (LOPRESSOR) tablet 50 mg (has no administration in time range)  amLODipine (NORVASC) tablet 10 mg (has no administration in time range)  isosorbide mononitrate (IMDUR) 24 hr tablet 60 mg (has no administration in time range)  lisinopril-hydrochlorothiazide (ZESTORETIC) 20-12.5 MG per tablet 1 tablet (has no administration in time range)  doxazosin (CARDURA) tablet 4 mg (has no administration in time range)    ED Course  I have reviewed the triage vital signs and the nursing notes.  Pertinent labs & imaging results that were available during my care of the patient were reviewed by me and considered in my medical decision making (see chart for details).  Clinical Course as of Dec 13 1721  Thu Dec 14, 2019  1719 Creatinine  today is 1.52, chart review shows x5 months ago it was 1.50.  Creatinine(!): 1.52 [KA]  1721 First troponin is normal at 6  Troponin I (High Sensitivity): 6 [KA]  1722 No leukocytosis, no anemia  CBC with Differential [KA]  1722 No severe electrolyte derangement, normal liver function  Comprehensive metabolic panel(!) [KA]    Clinical Course User Index [KA] Cherre Robins, PA-C   Vitals:   12/14/19 1503 12/14/19 1515 12/14/19 1705  BP: (!) 162/121 (!) 151/112 (!) 193/130  Pulse: 95 99 99  Resp: (!) 25 (!) 24 (!) 22  Temp: 98.5 F (36.9 C)    TempSrc: Oral    SpO2: 98% 98% 99%    MDM Rules/Calculators/A&P                     Patient seen and examined.  He is presenting from outpatient surgical center after being found to be hypertensive.  On arrival to the ED his blood pressure was 151/112, no hypoxia or tachycardia.  Neuro exam is normal. he is currently asymptomatic.  Records sent over from surgery center show his  medication list only includes metoprolol.  Looking through his chart PCP and cardiology notes show he has multiple hypertension medications listed.  When I asked patient that his medications he says he is only taking the metoprolol.  I am unsure why he stopped taking his other medications.  I ordered his home hypertensive medications while in the ED. EKG shows no ischemic changes. I viewed pt's chest xray and it does not suggest acute infectious processes.  Screening lab ordered and significant results as documented in ED course above. The patient was discussed with and seen by Dr. Eulis Foster who agrees with the treatment plan.    Patient care transferred to L. Layden PA-C at the end of my shift pending second troponin and med reconciliation with pharmacy.  Patient will likely need his home medications reordered. Patient presentation, ED course, and plan of care discussed with review of all pertinent labs and imaging. Please see her note for further details regarding further ED course and disposition.   Portions of this note were generated with [employer]. Dictation errors may occur despite best attempts at proofreading.  Final Clinical Impression(s) / ED Diagnoses Final diagnoses:  None    Rx / DC Orders ED Discharge Orders    None       Cherre Robins, PA-C 12/14/19 1730    Daleen Bo, MD 12/17/19 1330

## 2019-12-14 NOTE — ED Triage Notes (Addendum)
Pt arrived via [facility] from surgery center where pt was scheduled to have foot surgery this afternoon. Pre-op vitals reflected hypertension. Pt was given 16m metroprolol and 532mhydralazine by surgery team without effect. EMS reported manual bp of 230/140, hr 80, rr18, 98%ra, cbg 150. Pt is alert and oriented x4 at time of triage. Denies headache or pain at this time. Pt held his daily medications last night and this morning, per surgery center.

## 2019-12-14 NOTE — Telephone Encounter (Signed)
I have called the [facility] three times to inquire what exactly this patient needs. I was placed on hold to await the nurse manager and the phone was disconnected each time.I am now being told that the wife is on the phone with the center and someone will call me back.

## 2019-12-14 NOTE — ED Provider Notes (Addendum)
Care assumed from  Bernell List, PA-C at shift change with labs and re-evaluation pending.   In brief, this patient is a 73 y.o. male who was at the outpatient surgery center when he was found to be hypertensive.  Patient has no complaints at this time but his blood pressure was high and they had advised him to go to the emergency department.  Please see note from previous provider for full history/physical exam.   Physical Exam  BP (!) 174/120   Pulse 96   Temp 98.5 F (36.9 C) (Oral)   Resp (!) 27   SpO2 99%   Physical Exam  ED Course/Procedures   Clinical Course as of Dec 13 1857  Thu Dec 14, 2019  1719 Creatinine today is 1.52, chart review shows x5 months ago it was 1.50.  Creatinine(!): 1.52 [KA]  1721 First troponin is normal at 6  Troponin I (High Sensitivity): 6 [KA]  1722 No leukocytosis, no anemia  CBC with Differential [KA]  1722 No severe electrolyte derangement, normal liver function  Comprehensive metabolic panel(!) [KA]    Clinical Course User Index [KA] Albrizze, Harley Hallmark, PA-C    Procedures  MDM   PLAN: Patient pending basic labs.  There is question of what medications he should be on.  He is currently only taking metoprolol but previous note from cardiology suggested that he was on multiple medications.  Patient given dose of amlodipine, lisinopril, metoprolol here in the emergency department.  MDM:  CBC is unremarkable.  CMP shows creatinine 1.52. Trop negative.   I reviewed patients cardiology note from May 2020 which indicates that he was supposed to be on hydrochlorothiazide, lisinopril, metoprolol and amlodipine.  He is willing to take metoprolol.  We will plan to restart him on the amlodipine.   I discussed with patient.  He is currently asymptomatic at this time.  He is still slightly hypertensive but I suspect this is likely causing his abdominal sensations.  He has no complaints at this time.  I instructed him that he will need to follow-up  with his primary care doctor for further evaluation.  Instructed patient take medications as prescribed. At this time, patient exhibits no emergent life-threatening condition that require further evaluation in ED or admission. Patient had ample opportunity for questions and discussion. All patient's questions were answered with full understanding. Strict return precautions discussed. Patient expresses understanding and agreement to plan.   Portions of this note were generated with Lobbyist. Dictation errors may occur despite best attempts at proofreading.      1. Essential hypertension        Desma Mcgregor 12/14/19 2342    Ezequiel Essex, MD 12/15/19 0059    Volanda Napoleon, PA-C 12/15/19 1511    Ezequiel Essex, MD 12/16/19 [id number]

## 2019-12-14 NOTE — ED Provider Notes (Signed)
  Face-to-face evaluation   History: He was sent here for elevated heart rate and blood pressure, after being off his medicines overnight for foot surgery today.  She is somewhat confused about exactly what medications he supposed to be on.  He is currently apparently only taking metoprolol.  Denies headache, chest pain or shortness of breath.  Physical exam: Heart, calm, cooperative.  Heart regular rate without murmur.  Lungs clear.  No dysarthria or aphasia.  Medical screening examination/treatment/procedure(s) were conducted as a shared visit with non-physician practitioner(s) and myself.  I personally evaluated the patient during the encounter    Daleen Bo, MD 12/17/19 1330

## 2019-12-14 NOTE — Telephone Encounter (Signed)
New Message:    They are on the phone now, have a pt that needs to be seen today

## 2019-12-15 ENCOUNTER — Telehealth: Payer: Self-pay | Admitting: Family Medicine

## 2019-12-15 NOTE — Telephone Encounter (Signed)
Called patient to follow up from his ER visit that occurred on yesterday. Patient was seen in the ER for hypertension from [place] as he was scheduled for a surgery on his foot. Spoke with patient and he states that he feeling fine. States that with elevated BP he has not felt any symptoms at all. He currently does not have a blood pressure cuff at home. I have instructed him to purchase one. I have scheduled him for an office visit with Dr. Dennard Schaumann on Monday. Medications are as reflected in epic. Patient will be going today to pick up prescriptions sent from the ER.

## 2019-12-18 ENCOUNTER — Ambulatory Visit (INDEPENDENT_AMBULATORY_CARE_PROVIDER_SITE_OTHER): Payer: PPO | Admitting: Family Medicine

## 2019-12-18 ENCOUNTER — Other Ambulatory Visit: Payer: Self-pay

## 2019-12-18 VITALS — BP 110/80 | HR 118 | Temp 97.5°F | Resp 18 | Ht 66.75 in | Wt 212.0 lb

## 2019-12-18 DIAGNOSIS — I2510 Atherosclerotic heart disease of native coronary artery without angina pectoris: Secondary | ICD-10-CM | POA: Diagnosis not present

## 2019-12-18 DIAGNOSIS — N401 Enlarged prostate with lower urinary tract symptoms: Secondary | ICD-10-CM | POA: Diagnosis not present

## 2019-12-18 DIAGNOSIS — E118 Type 2 diabetes mellitus with unspecified complications: Secondary | ICD-10-CM | POA: Diagnosis not present

## 2019-12-18 DIAGNOSIS — I10 Essential (primary) hypertension: Secondary | ICD-10-CM | POA: Diagnosis not present

## 2019-12-18 MED ORDER — NEOMYCIN-POLYMYXIN-HC 3.5-10000-1 OT SOLN: 4.0000 [drp] | Freq: Four times a day (QID) | OTIC | 1 refills | Status: AC

## 2019-12-18 NOTE — Progress Notes (Signed)
Subjective:    Patient ID: Manuel Grant, male    DOB: April 15, 1946, 73 y.o.   MRN: [id number]  Patient recently was having outpatient surgery on his right foot to remove the "bone spur that was causing him a significant amount of pain earlier this year.  Apparently the patient had discontinued all of his blood pressure medication except his metoprolol.  His medication list included metoprolol, lisinopril/hydrochlorothiazide, amlodipine, doxazosin, and isosorbide mononitrate.  Patient states that he was taking the metoprolol, the doxazosin, and isosorbide however he had discontinued the amlodipine and the lisinopril/hydrochlorothiazide.  There is some conflict as to why he has stopped the medication.  Today he tells me that the surgeon had told him to stop all medications prior to surgery however it sounds like he has stopped them for quite some time.  He denied any side effects from the medication.  He was seen in the emergency room after he went for his elective outpatient surgery.  Apparently at the surgical center his blood pressure was greater than 200/100 and he was referred to the emergency room.  He was elevated significantly in the emergency room as well and they recommended having the patient resume his lisinopril hydrochlorothiazide as well as his amlodipine.  Patient got a prescription for these however over the last few days he has discontinued his metoprolol.  His blood pressure today is well controlled and I will verify this myself however his heart rate is elevated given the abrupt discontinuation of metoprolol.  I explained to the patient the benefit of a beta-blocker in someone who has a history of cardiovascular disease.  Also explained to him the importance of controlling blood pressure to help prevent progression of chronic kidney disease.  Patient feels that he does not require as many blood pressure pills as he is taking.  He needs the doxazosin due to BPH. Past Medical History:    Diagnosis Date  . B12 deficiency   . Chronic deep vein thrombosis (DVT) ([facility]) dx 2015  start on xarelto  LLE--  stopped in 03/ 2016 due to GI bleed   last doppler 02-09-2015 chronic left common femoral and popliteal dvt's (mild partial resolution since dx 2015)  . CKD (chronic kidney disease), stage II   . Coronary artery disease cardiologist-  dr berry   a. 10/1998 Cath: LM 30d, LAD 68m OM 50, RCA 100 w/ L->R collats, nl LV ->Med Rx;  b. 07/2010 MV: no ischemia.  . DDD (degenerative disc disease), cervical   . GERD (gastroesophageal reflux disease)   . Hiatal hernia   . History of adenomatous polyp of colon   . History of balanitis    05/ 2017  . History of duodenal ulcer    w/ GI bleed in 2015 and 2016  . History of GI bleed    secondary to upper duodenal ulcer's 2015 and 2016   . History of Helicobacter pylori infection    2015  . History of kidney stones   . Hyperlipidemia   . Hypertension   . Ileo-anal pouch ulcer (HCC)    chronic  . Insomnia   . Lower urinary tract symptoms (LUTS)   . Osteoarthritis    knees  . Phimosis   . Rosacea   . Type 2 diabetes mellitus ([facility]    last A1c 8.5 on 03-30-2016  . Ulcerative colitis followed by  [name]   [facility] . Wears dentures    upper  .  Wears glasses    Past Surgical History:  Procedure Laterality Date  . CARDIAC CATHETERIZATION  11/15/1998   dr berry   30% distal left main, 75% mid LAD, 50% OM and total RCA with left to right collateral and normal LV function.  [name] CIRCUMCISION N/A 09/25/2016   Procedure: CIRCUMCISION ADULT;  Surgeon: Alexis Frock, MD;  Location[facility];  Service: Urology;  Laterality: N/A;  . COMPLETION PROCTECTOMY/ CONSTRUCTION ILEAL J POUCH/ DIVERTING LOOP ILEOSTOMY/ EXTENSIVE LYSIS ADHESIONS  11-07-2004   [facility]  . ESOPHAGOGASTRODUODENOSCOPY N/A 03/16/2014   Procedure: ESOPHAGOGASTRODUODENOSCOPY (EGD);  Surgeon: Jerene Bears, MD;  Location[facility] ENDOSCOPY;   Service: Endoscopy;  Laterality: N/A;  . FLEXIBLE SIGMOIDOSCOPY N/A 03/16/2014   Procedure: FLEXIBLE SIGMOIDOSCOPY;  Surgeon: Jerene Bears, MD;  Location: [facility] ENDOSCOPY;  Service: Endoscopy;  Laterality: N/A;  . KNEE ARTHROSCOPY Bilateral 1990's  . NM MYOCAR PERF WALL MOTION  08/06/2010   dr berry   Low risk nuclear study w/ no ischemia (no significant change compared to prior study)/  normal LV function and wall motion , ef 60%  . SUBTOTAL COLECTOMY  W/ END ILEOSTOMY  05/ 2005   [facility]  . TAKE DOWN LOOP ILEOSTOMY  01-28-2005    [facility]  . TRANSTHORACIC ECHOCARDIOGRAM  11/16/2007   mild concentric LVH,  ef >55%,  grade 1 diastolic dysfuction/  trivial MR and TR/  borderline RVE/  focal nodular thickening of noncoronary cusp of trileaflet AV/     Current Outpatient Medications on File Prior to Visit  Medication Sig Dispense Refill  . amLODipine (NORVASC) 10 MG tablet Take 1 tablet (10 mg total) by mouth daily. 30 tablet 0  . cephALEXin (KEFLEX) 500 MG capsule Take 1 capsule (500 mg total) by mouth 3 (three) times daily. 30 capsule 0  . doxazosin (CARDURA) 4 MG tablet TAKE 1 TABLET BY MOUTH ONCE DAILY 90 tablet 3  . glipiZIDE (GLUCOTROL XL) 10 MG 24 hr tablet TAKE 1 TABLET BY MOUTH ONCE A DAY 90 tablet 1  . isosorbide mononitrate (IMDUR) 60 MG 24 hr tablet TAKE 1 TABLET BY MOUTH ONCE DAILY 30 tablet 5  . JANUVIA 100 MG tablet TAKE 1 TABLET BY MOUTH ONCE A DAY 30 tablet 3  . lisinopril-hydrochlorothiazide (ZESTORETIC) 20-12.5 MG tablet Take 1 tablet by mouth daily. 30 tablet 0  . meloxicam (MOBIC) 15 MG tablet TAKE 1 TABLET BY MOUTH ONCE DAILY 30 tablet 3  . metFORMIN (GLUCOPHAGE) 1000 MG tablet TAKE 1 TABLET BY MOUTH TWICE A DAY WITH FOOD. 60 tablet 3  . minocycline (MINOCIN,DYNACIN) 50 MG capsule Take 1 capsule by mouth every morning.     . ondansetron (ZOFRAN) 4 MG tablet Take 1 tablet (4 mg total) by mouth every 8 (eight) hours as needed. 20 tablet 0  . oxyCODONE-acetaminophen (PERCOCET) 10-325 MG  tablet Take 1 tablet by mouth every 8 (eight) hours as needed for up to 7 days for pain. 21 tablet 0  . pantoprazole (PROTONIX) 40 MG tablet Take 1 tablet (40 mg total) by mouth daily. 90 tablet 3  . tiZANidine (ZANAFLEX) 4 MG tablet TAKE 1 TABLET BY MOUTH AT BEDTIME AS NEEDED 30 tablet 2  . traMADol (ULTRAM) 50 MG tablet TAKE 2 TABLETS BY MOUTH 3 TIMES A DAY ASNEEDED FOR CHRONIC PAIN 180 tablet 3  . VICTOZA 18 MG/3ML SOPN INJECT 1.2 MG UNDER THE SKIN ONCE A DAY 6 mL 3  . zolpidem (AMBIEN) 10 MG tablet TAKE 1 TABLET BY MOUTH  AT BEDTIME AS NEEDED FOR SLEEP 30 tablet 2  . metoprolol succinate (TOPROL-XL) 50 MG 24 hr tablet TAKE 1 AND 1/2 TABLETS BY MOUTH DAILY. TAKE WITH OR IMMEDIATELY FOLLOWING A MEAL (Patient not taking: No sig reported) 45 tablet 11   No current facility-administered medications on file prior to visit.   No Known Allergies Social History   Socioeconomic History  . Marital status: Married    Spouse name: Not on file  . Number of children: 2  . Years of education: Not on file  . Highest education level: Not on file  Occupational History  . Occupation: retired  Tobacco Use  . Smoking status: Former Smoker    Packs/day: 2.00    Years: 9.00    Pack years: 18.00    Types: Cigarettes    Quit date: 12/28/1974    Years since quitting: 45.0  . Smokeless tobacco: Never Used  Substance and Sexual Activity  . Alcohol use: Yes    Alcohol/week: 0.0 standard drinks    Comment: occ  . Drug use: No  . Sexual activity: Not on file  Other Topics Concern  . Not on file  Social History Narrative   Married 1967   Retired from [employer] work   Lives in [place] with his wife.   Social Determinants of Health   Financial Resource Strain:   . Difficulty of Paying Living Expenses: Not on file  Food Insecurity:   . Worried About Charity fundraiser in the Last Year: Not on file  . Ran Out of Food in the Last Year: Not on file  Transportation Needs:   . Lack of Transportation  (Medical): Not on file  . Lack of Transportation (Non-Medical): Not on file  Physical Activity:   . Days of Exercise per Week: Not on file  . Minutes of Exercise per Session: Not on file  Stress:   . Feeling of Stress : Not on file  Social Connections:   . Frequency of Communication with Friends and Family: Not on file  . Frequency of Social Gatherings with Friends and Family: Not on file  . Attends Religious Services: Not on file  . Active Member of Clubs or Organizations: Not on file  . Attends Archivist Meetings: Not on file  . Marital Status: Not on file  Intimate Partner Violence:   . Fear of Current or Ex-Partner: Not on file  . Emotionally Abused: Not on file  . Physically Abused: Not on file  . Sexually Abused: Not on file      Review of Systems  All other systems reviewed and are negative.      Objective:   Physical Exam  Constitutional: He appears well-developed and well-nourished.  Cardiovascular: Normal rate, regular rhythm and normal heart sounds.  No murmur heard. Pulmonary/Chest: Effort normal and breath sounds normal. No respiratory distress. He has no wheezes. He has no rales.  Abdominal: Soft. Bowel sounds are normal. He exhibits no distension. There is no abdominal tenderness. There is no rebound and no guarding.  Musculoskeletal:        General: No edema.  Vitals reviewed.         Assessment & Plan:  Controlled type 2 diabetes mellitus with complication, without long-term current use of insulin (HCC) - Plan: Hemoglobin A1c, COMPLETE METABOLIC PANEL WITH GFR  ASCVD (arteriosclerotic cardiovascular disease)  Essential hypertension  Benign prostatic hyperplasia with lower urinary tract symptoms, symptom details unspecified  I explained to the patient that  it is important for him to resume the metoprolol to help manage his heart rate and prevent future cardiovascular morbidity.  I also explained the importance of continuing lisinopril  and helping prevent further kidney damage and also its role in preventing cardiovascular morbidity.  Patient feels that he benefits from taking doxazosin regarding his BPH.  Therefore the only medication that potentially could be stopped would be amlodipine.  I explained to the patient that I am fine with him discontinuing the amlodipine as long as his blood pressure remains less than 140/90.  If it does not remain less than 140/90 I would ask him to resume the amlodipine.  I explained this clearly to the patient.  I will also check CMP today to monitor his renal function as well as a hemoglobin A1c to monitor his diabetes management.  Patient plans on discontinuing amlodipine and monitor his blood pressure more closely.

## 2019-12-19 ENCOUNTER — Other Ambulatory Visit: Payer: Self-pay | Admitting: *Deleted

## 2019-12-19 DIAGNOSIS — N289 Disorder of kidney and ureter, unspecified: Secondary | ICD-10-CM

## 2019-12-19 LAB — COMPLETE METABOLIC PANEL WITH GFR
AG Ratio: 1.2 (calc) (ref 1.0–2.5)
ALT: 16 U/L (ref 9–46)
AST: 23 U/L (ref 10–35)
Albumin: 4.1 g/dL (ref 3.6–5.1)
Alkaline phosphatase (APISO): 48 U/L (ref 35–144)
BUN/Creatinine Ratio: 14 (calc) (ref 6–22)
BUN: 27 mg/dL — ABNORMAL HIGH (ref 7–25)
CO2: 25 mmol/L (ref 20–32)
Calcium: 9.4 mg/dL (ref 8.6–10.3)
Chloride: 101 mmol/L (ref 98–110)
Creat: 1.9 mg/dL — ABNORMAL HIGH (ref 0.70–1.18)
GFR, Est African American: 39 mL/min/{1.73_m2} — ABNORMAL LOW (ref >=60)
GFR, Est Non African American: 34 mL/min/{1.73_m2} — ABNORMAL LOW (ref >=60)
Globulin: 3.5 g/dL (calc) (ref 1.9–3.7)
Glucose, Bld: 276 mg/dL — ABNORMAL HIGH (ref 65–99)
Potassium: 4.3 mmol/L (ref 3.5–5.3)
Sodium: 140 mmol/L (ref 135–146)
Total Bilirubin: 0.5 mg/dL (ref 0.2–1.2)
Total Protein: 7.6 g/dL (ref 6.1–8.1)

## 2019-12-19 LAB — HEMOGLOBIN A1C
Hgb A1c MFr Bld: 7.1 % of total Hgb — ABNORMAL HIGH (ref <5.7)
Mean Plasma Glucose: 157 (calc)
eAG (mmol/L): 8.7 (calc)

## 2019-12-20 ENCOUNTER — Encounter: Payer: PPO | Admitting: Podiatry

## 2019-12-20 ENCOUNTER — Other Ambulatory Visit: Payer: PPO

## 2019-12-20 DIAGNOSIS — N289 Disorder of kidney and ureter, unspecified: Secondary | ICD-10-CM | POA: Diagnosis not present

## 2019-12-20 LAB — URINALYSIS, ROUTINE W REFLEX MICROSCOPIC
Bacteria, UA: NONE SEEN /HPF
Bilirubin Urine: NEGATIVE
Hgb urine dipstick: NEGATIVE
Ketones, ur: NEGATIVE
Leukocytes,Ua: NEGATIVE
Nitrite: NEGATIVE
RBC / HPF: NONE SEEN /HPF (ref 0–2)
Specific Gravity, Urine: 1.019 (ref 1.001–1.03)
Squamous Epithelial / HPF: NONE SEEN /HPF (ref <=5)
pH: 5 (ref 5.0–8.0)

## 2019-12-20 LAB — MICROSCOPIC MESSAGE

## 2019-12-26 ENCOUNTER — Other Ambulatory Visit: Payer: Self-pay | Admitting: Cardiovascular Disease

## 2019-12-27 ENCOUNTER — Encounter: Payer: PPO | Admitting: Podiatry

## 2019-12-27 ENCOUNTER — Ambulatory Visit
Admission: RE | Admit: 2019-12-27 | Discharge: 2019-12-27 | Disposition: A | Discharge status: Home / Self Care | Payer: PPO | Source: Ambulatory Visit | Attending: Family Medicine | Admitting: Family Medicine

## 2019-12-27 DIAGNOSIS — N289 Disorder of kidney and ureter, unspecified: Secondary | ICD-10-CM

## 2019-12-27 DIAGNOSIS — N281 Cyst of kidney, acquired: Secondary | ICD-10-CM | POA: Diagnosis not present

## 2019-12-28 ENCOUNTER — Other Ambulatory Visit: Payer: Self-pay | Admitting: *Deleted

## 2019-12-28 DIAGNOSIS — N289 Disorder of kidney and ureter, unspecified: Secondary | ICD-10-CM

## 2020-01-02 ENCOUNTER — Other Ambulatory Visit: Payer: Self-pay

## 2020-01-02 ENCOUNTER — Other Ambulatory Visit: Payer: PPO

## 2020-01-02 DIAGNOSIS — N289 Disorder of kidney and ureter, unspecified: Secondary | ICD-10-CM | POA: Diagnosis not present

## 2020-01-03 LAB — BASIC METABOLIC PANEL WITH GFR
BUN/Creatinine Ratio: 11 (calc) (ref 6–22)
BUN: 20 mg/dL (ref 7–25)
CO2: 28 mmol/L (ref 20–32)
Calcium: 9.5 mg/dL (ref 8.6–10.3)
Chloride: 99 mmol/L (ref 98–110)
Creat: 1.89 mg/dL — ABNORMAL HIGH (ref 0.70–1.18)
GFR, Est African American: 40 mL/min/{1.73_m2} — ABNORMAL LOW (ref >=60)
GFR, Est Non African American: 34 mL/min/{1.73_m2} — ABNORMAL LOW (ref >=60)
Glucose, Bld: 231 mg/dL — ABNORMAL HIGH (ref 65–99)
Potassium: 4 mmol/L (ref 3.5–5.3)
Sodium: 139 mmol/L (ref 135–146)

## 2020-01-10 ENCOUNTER — Encounter: Payer: PPO | Admitting: Podiatry

## 2020-01-16 ENCOUNTER — Other Ambulatory Visit (INDEPENDENT_AMBULATORY_CARE_PROVIDER_SITE_OTHER): Payer: PPO

## 2020-01-16 ENCOUNTER — Other Ambulatory Visit: Payer: Self-pay | Admitting: Family Medicine

## 2020-01-16 DIAGNOSIS — D509 Iron deficiency anemia, unspecified: Secondary | ICD-10-CM | POA: Diagnosis not present

## 2020-01-16 LAB — CBC WITH DIFFERENTIAL/PLATELET
Basophils Absolute: 0.1 10*3/uL (ref 0.0–0.1)
Basophils Relative: 1.1 % (ref 0.0–3.0)
Eosinophils Absolute: 0.1 10*3/uL (ref 0.0–0.7)
Eosinophils Relative: 2.1 % (ref 0.0–5.0)
HCT: 42.8 % (ref 39.0–52.0)
Hemoglobin: 14 g/dL (ref 13.0–17.0)
Lymphocytes Relative: 20 % (ref 12.0–46.0)
Lymphs Abs: 1.3 10*3/uL (ref 0.7–4.0)
MCHC: 32.8 g/dL (ref 30.0–36.0)
MCV: 86.7 fl (ref 78.0–100.0)
Monocytes Absolute: 0.6 10*3/uL (ref 0.1–1.0)
Monocytes Relative: 9.7 % (ref 3.0–12.0)
Neutro Abs: 4.3 10*3/uL (ref 1.4–7.7)
Neutrophils Relative %: 67.1 % (ref 43.0–77.0)
Platelets: 143 10*3/uL — ABNORMAL LOW (ref 150.0–400.0)
RBC: 4.93 Mil/uL (ref 4.22–5.81)
RDW: 15.4 % (ref 11.5–15.5)
WBC: 6.4 10*3/uL (ref 4.0–10.5)

## 2020-01-16 LAB — IBC + FERRITIN
Ferritin: 134.1 ng/mL (ref 22.0–322.0)
Iron: 57 ug/dL (ref 42–165)
Saturation Ratios: 15.7 % — ABNORMAL LOW (ref 20.0–50.0)
Transferrin: 259 mg/dL (ref 212.0–360.0)

## 2020-01-16 NOTE — Telephone Encounter (Signed)
Ok to refill Ambien??  Last office visit 12/18/2019.  Last refill 11/17/2019, #2 refills.

## 2020-01-22 ENCOUNTER — Other Ambulatory Visit: Payer: Self-pay

## 2020-01-22 DIAGNOSIS — D509 Iron deficiency anemia, unspecified: Secondary | ICD-10-CM

## 2020-01-24 ENCOUNTER — Encounter: Payer: PPO | Admitting: Podiatry

## 2020-01-29 ENCOUNTER — Other Ambulatory Visit: Payer: Self-pay | Admitting: Family Medicine

## 2020-02-16 ENCOUNTER — Other Ambulatory Visit: Payer: Self-pay | Admitting: Family Medicine

## 2020-02-26 ENCOUNTER — Other Ambulatory Visit: Payer: Self-pay | Admitting: Family Medicine

## 2020-03-11 ENCOUNTER — Other Ambulatory Visit: Payer: Self-pay | Admitting: Family Medicine

## 2020-03-15 ENCOUNTER — Other Ambulatory Visit: Payer: Self-pay | Admitting: Family Medicine

## 2020-03-29 DIAGNOSIS — R404 Transient alteration of awareness: Secondary | ICD-10-CM | POA: Diagnosis not present

## 2020-03-29 DIAGNOSIS — W19XXXA Unspecified fall, initial encounter: Secondary | ICD-10-CM | POA: Diagnosis not present

## 2020-03-29 DIAGNOSIS — I499 Cardiac arrhythmia, unspecified: Secondary | ICD-10-CM | POA: Diagnosis not present

## 2020-04-27 DIAGNOSIS — 419620001 Death: Secondary | SNOMED CT | POA: Diagnosis not present

## 2020-04-27 DEATH — deceased

## 2020-05-09 ENCOUNTER — Telehealth: Payer: Self-pay | Admitting: Family Medicine

## 2020-05-09 NOTE — Chronic Care Management (AMB) (Signed)
  Chronic Care Management   Outreach Note  05/09/2020 Name: Manuel Grant MRN: [id number] DOB: September 03, 1946  Referred by: [name], MD Reason for referral : Chronic Care Management   An unsuccessful telephone outreach was attempted today. The patient was referred to the pharmacist for assistance with care management and care coordination.   Follow Up Plan:   [name]

## 2020-06-05 ENCOUNTER — Telehealth: Payer: Self-pay | Admitting: Family Medicine

## 2020-06-05 NOTE — Progress Notes (Signed)
  Chronic Care Management   Outreach Note  06/05/2020 Name: Manuel Grant MRN: [id number] DOB: Jul 28, 1946  Referred by: [name], MD Reason for referral : No chief complaint on file.   A second unsuccessful telephone outreach was attempted today. The patient was referred to pharmacist for assistance with care management and care coordination.  Follow Up Plan:   [place]

## 2020-06-21 ENCOUNTER — Telehealth: Payer: Self-pay | Admitting: Family Medicine

## 2020-06-21 NOTE — Progress Notes (Signed)
  Chronic Care Management   Outreach Note  06/21/2020 Name: Manuel Grant MRN: [id number] DOB: [birth year]  Referred by: [name], MD Reason for referral : No chief complaint on file.   Third unsuccessful telephone outreach was attempted today. The patient was referred to the pharmacist for assistance with care management and care coordination.   Follow Up Plan:   [place]

## 2020-07-22 ENCOUNTER — Telehealth: Payer: Self-pay | Admitting: *Deleted

## 2020-07-22 NOTE — Telephone Encounter (Signed)
-----   Message from Algernon Huxley, RN sent at 01/22/2020 11:18 AM EST ----- Regarding: Orders Order and reminder in epic.

## 2020-07-22 NOTE — Telephone Encounter (Signed)
Home number is not longer in service.

## 2020-07-24 NOTE — Telephone Encounter (Signed)
Unable to reach patient at any numbers in chart. Mailed letter to patient.
